# Patient Record
Sex: Female | Born: 1990 | Race: White | Hispanic: No | Marital: Single | Smoking: Never smoker
Health system: Southern US, Community
[De-identification: ages and names within clinical notes are randomized; demographics above are authoritative.]

## PROBLEM LIST (undated history)

## (undated) DIAGNOSIS — K219 Gastro-esophageal reflux disease without esophagitis: Secondary | ICD-10-CM

## (undated) DIAGNOSIS — D509 Iron deficiency anemia, unspecified: Secondary | ICD-10-CM

## (undated) DIAGNOSIS — E538 Deficiency of other specified B group vitamins: Secondary | ICD-10-CM

## (undated) DIAGNOSIS — F419 Anxiety disorder, unspecified: Secondary | ICD-10-CM

## (undated) DIAGNOSIS — R519 Headache, unspecified: Secondary | ICD-10-CM

## (undated) DIAGNOSIS — K31A0 Gastric intestinal metaplasia, unspecified: Secondary | ICD-10-CM

## (undated) DIAGNOSIS — E669 Obesity, unspecified: Secondary | ICD-10-CM

## (undated) DIAGNOSIS — F32A Depression, unspecified: Secondary | ICD-10-CM

## (undated) DIAGNOSIS — T8859XA Other complications of anesthesia, initial encounter: Secondary | ICD-10-CM

## (undated) DIAGNOSIS — Z8489 Family history of other specified conditions: Secondary | ICD-10-CM

## (undated) DIAGNOSIS — G43909 Migraine, unspecified, not intractable, without status migrainosus: Secondary | ICD-10-CM

## (undated) DIAGNOSIS — G8929 Other chronic pain: Secondary | ICD-10-CM

## (undated) DIAGNOSIS — T753XXA Motion sickness, initial encounter: Secondary | ICD-10-CM

## (undated) HISTORY — PX: WISDOM TOOTH EXTRACTION: SHX21

## (undated) HISTORY — PX: UPPER GI ENDOSCOPY: SHX6162

## (undated) HISTORY — DX: Obesity, unspecified: E66.9

## (undated) HISTORY — DX: Headache, unspecified: R51.9

## (undated) HISTORY — DX: Other chronic pain: G89.29

## (undated) HISTORY — DX: Depression, unspecified: F32.A

## (undated) HISTORY — DX: Iron deficiency anemia, unspecified: D50.9

## (undated) HISTORY — DX: Deficiency of other specified B group vitamins: E53.8

## (undated) HISTORY — DX: Gastric intestinal metaplasia, unspecified: K31.A0

---
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
## 2012-02-03 ENCOUNTER — Other Ambulatory Visit: Payer: Self-pay | Admitting: Internal Medicine

## 2012-03-02 ENCOUNTER — Ambulatory Visit: Payer: BC Managed Care – PPO | Admitting: Internal Medicine

## 2012-04-28 ENCOUNTER — Ambulatory Visit: Payer: BC Managed Care – PPO | Admitting: Internal Medicine

## 2012-10-25 ENCOUNTER — Telehealth: Payer: Self-pay | Admitting: Internal Medicine

## 2012-10-25 NOTE — Telephone Encounter (Signed)
Patient needing Hep B shot for school before Thursday 8.14.14 . Please advise.

## 2012-10-26 ENCOUNTER — Encounter: Payer: Self-pay | Admitting: *Deleted

## 2012-10-26 NOTE — Telephone Encounter (Signed)
Spoke with patient mother she wanted to know if the patient has ever had a Tdap. Informed her we do not have any documentation in our records of her receiving it here.

## 2012-10-26 NOTE — Telephone Encounter (Signed)
Left message to call back  

## 2013-04-07 ENCOUNTER — Telehealth: Payer: Self-pay | Admitting: Internal Medicine

## 2013-04-07 NOTE — Telephone Encounter (Signed)
She would like for her to have something stronger than OTC medications to take for her lactose intolerance.

## 2013-04-07 NOTE — Telephone Encounter (Signed)
Mother in office to ask if there is a prescription medication to help with Demetris's lactose intolerance that is stronger than OTC meds.

## 2013-04-07 NOTE — Telephone Encounter (Signed)
There is not a medication for lactose intolerance that is stronger than lactaid. The treatment is avoidance of lactose, ie dairy products. If symptoms persistent, though, she should be seen and evaluated for other causes of symptoms.

## 2013-04-17 NOTE — Telephone Encounter (Signed)
Left message to call back  

## 2013-04-18 ENCOUNTER — Encounter: Payer: Self-pay | Admitting: *Deleted

## 2013-04-18 NOTE — Telephone Encounter (Signed)
No return call, letter mailed to patient's mother home address on file.

## 2014-10-18 ENCOUNTER — Encounter (INDEPENDENT_AMBULATORY_CARE_PROVIDER_SITE_OTHER): Payer: Self-pay

## 2014-10-18 ENCOUNTER — Ambulatory Visit (INDEPENDENT_AMBULATORY_CARE_PROVIDER_SITE_OTHER): Payer: BC Managed Care – PPO | Admitting: Internal Medicine

## 2014-10-18 ENCOUNTER — Encounter: Payer: Self-pay | Admitting: Internal Medicine

## 2014-10-18 VITALS — BP 109/74 | HR 77 | Temp 98.1°F | Ht 67.0 in | Wt 193.2 lb

## 2014-10-18 DIAGNOSIS — Z0000 Encounter for general adult medical examination without abnormal findings: Secondary | ICD-10-CM | POA: Diagnosis not present

## 2014-10-18 DIAGNOSIS — R358 Other polyuria: Secondary | ICD-10-CM

## 2014-10-18 DIAGNOSIS — D509 Iron deficiency anemia, unspecified: Secondary | ICD-10-CM | POA: Diagnosis not present

## 2014-10-18 DIAGNOSIS — E669 Obesity, unspecified: Secondary | ICD-10-CM | POA: Diagnosis not present

## 2014-10-18 DIAGNOSIS — R3589 Other polyuria: Secondary | ICD-10-CM | POA: Insufficient documentation

## 2014-10-18 DIAGNOSIS — E663 Overweight: Secondary | ICD-10-CM | POA: Insufficient documentation

## 2014-10-18 LAB — POCT URINALYSIS DIPSTICK
BILIRUBIN UA: NEGATIVE
GLUCOSE UA: NEGATIVE
Ketones, UA: NEGATIVE
LEUKOCYTES UA: NEGATIVE
NITRITE UA: NEGATIVE
PH UA: 5.5
PROTEIN UA: NEGATIVE
SPEC GRAV UA: 1.02
UROBILINOGEN UA: 0.2

## 2014-10-18 LAB — CBC WITH DIFFERENTIAL/PLATELET
Basophils Absolute: 0.1 10*3/uL (ref 0.0–0.1)
Basophils Relative: 1.8 % (ref 0.0–3.0)
EOS ABS: 0.1 10*3/uL (ref 0.0–0.7)
Eosinophils Relative: 2.2 % (ref 0.0–5.0)
HEMATOCRIT: 37.3 % (ref 36.0–46.0)
HEMOGLOBIN: 12.6 g/dL (ref 12.0–15.0)
LYMPHS PCT: 24.1 % (ref 12.0–46.0)
Lymphs Abs: 1.3 10*3/uL (ref 0.7–4.0)
MCHC: 33.7 g/dL (ref 30.0–36.0)
MCV: 94.3 fl (ref 78.0–100.0)
MONOS PCT: 7.4 % (ref 3.0–12.0)
Monocytes Absolute: 0.4 10*3/uL (ref 0.1–1.0)
NEUTROS PCT: 64.5 % (ref 43.0–77.0)
Neutro Abs: 3.5 10*3/uL (ref 1.4–7.7)
Platelets: 214 10*3/uL (ref 150.0–400.0)
RBC: 3.95 Mil/uL (ref 3.87–5.11)
RDW: 12.5 % (ref 11.5–15.5)
WBC: 5.4 10*3/uL (ref 4.0–10.5)

## 2014-10-18 LAB — HEMOGLOBIN A1C: Hgb A1c MFr Bld: 5 % (ref 4.6–6.5)

## 2014-10-18 LAB — LIPID PANEL
CHOLESTEROL: 153 mg/dL (ref 0–200)
HDL: 48 mg/dL (ref >39.00)
LDL CALC: 92 mg/dL (ref 0–99)
NonHDL: 105.24
TRIGLYCERIDES: 67 mg/dL (ref 0.0–149.0)
Total CHOL/HDL Ratio: 3
VLDL: 13.4 mg/dL (ref 0.0–40.0)

## 2014-10-18 LAB — COMPREHENSIVE METABOLIC PANEL
ALT: 14 U/L (ref 0–35)
AST: 20 U/L (ref 0–37)
Albumin: 4.3 g/dL (ref 3.5–5.2)
Alkaline Phosphatase: 62 U/L (ref 39–117)
BILIRUBIN TOTAL: 0.4 mg/dL (ref 0.2–1.2)
BUN: 12 mg/dL (ref 6–23)
CO2: 24 meq/L (ref 19–32)
CREATININE: 0.68 mg/dL (ref 0.40–1.20)
Calcium: 9.3 mg/dL (ref 8.4–10.5)
Chloride: 104 mEq/L (ref 96–112)
GFR: 115.21 mL/min (ref >60.00)
Glucose, Bld: 76 mg/dL (ref 70–99)
Potassium: 4.8 mEq/L (ref 3.5–5.1)
SODIUM: 137 meq/L (ref 135–145)
TOTAL PROTEIN: 7.1 g/dL (ref 6.0–8.3)

## 2014-10-18 LAB — FERRITIN: FERRITIN: 55.1 ng/mL (ref 10.0–291.0)

## 2014-10-18 LAB — TSH: TSH: 3.23 u[IU]/mL (ref 0.35–4.50)

## 2014-10-18 MED ORDER — PHENTERMINE HCL 37.5 MG PO CAPS: 37.5000 mg | ORAL_CAPSULE | ORAL | Status: DC

## 2014-10-18 NOTE — Assessment & Plan Note (Signed)
Wt Readings from Last 3 Encounters:  10/18/14 193 lb 4 oz (87.658 kg)  08/26/11 144 lb 8 oz (65.545 kg) (78 %*, Z = 0.77)  02/11/11 141 lb (63.957 kg) (76 %*, Z = 0.71)   * Growth percentiles are based on CDC 2-20 Years data.   Body mass index is 30.26 kg/(m^2). Encouraged healthy diet and exercise. Will start Phentermine to help with appetite suppression. Discussed potential side effects of this medication. Follow up in 4 weeks.

## 2014-10-18 NOTE — Assessment & Plan Note (Signed)
Will check urinalysis and blood sugars today.

## 2014-10-18 NOTE — Assessment & Plan Note (Signed)
Will recheck CBC and ferritin

## 2014-10-18 NOTE — Progress Notes (Signed)
Pre visit review using our clinic review tool, if applicable. No additional management support is needed unless otherwise documented below in the visit note. 

## 2014-10-18 NOTE — Progress Notes (Signed)
Subjective:    Patient ID: Melissa Walton, female    DOB: 1990/05/14, 24 y.o.   MRN: [id number]  HPI  23YO female presents for follow up and to re-establish care. Last seen 3 years ago.  Had some mild bilateral back pain last week which has resolved. No dysuria. Some increased frequency of urination. No blood in urine. No abdominal pain. No polyphagia. Strong family history of diabetes.  Worried about weight. Planning to start at a gym. Not following any diet.  Wt Readings from Last 3 Encounters:  10/18/14 193 lb 4 oz (87.658 kg)  08/26/11 144 lb 8 oz (65.545 kg) (78 %*, Z = 0.77)  02/11/11 141 lb (63.957 kg) (76 %*, Z = 0.71)   * Growth percentiles are based on CDC 2-20 Years data.     Past medical, surgical, family and social history per today's encounter.   Review of Systems  Constitutional: Negative for fever, chills, appetite change, fatigue and unexpected weight change.  Eyes: Negative for visual disturbance.  Respiratory: Negative for shortness of breath.   Cardiovascular: Negative for chest pain and leg swelling.  Gastrointestinal: Negative for abdominal pain, diarrhea and constipation.  Genitourinary: Positive for frequency. Negative for dysuria, urgency, hematuria, flank pain, vaginal bleeding, vaginal discharge, vaginal pain and pelvic pain.  Musculoskeletal: Positive for back pain. Negative for myalgias and arthralgias.  Skin: Negative for color change and rash.  Hematological: Negative for adenopathy. Does not bruise/bleed easily.  Psychiatric/Behavioral: Negative for sleep disturbance and dysphoric mood. The patient is not nervous/anxious.        Objective:    BP 109/74 mmHg  Pulse 77  Temp(Src) 98.1 F (36.7 C) (Oral)  Ht  (1.702 m)  Wt 193 lb 4 oz (87.658 kg)  BMI 30.26 kg/m2  SpO2 99%  LMP 09/17/2014 (Approximate) Physical Exam  Constitutional: She is oriented to person, place, and time. She appears well-developed and well-nourished. No  distress.  HENT:  Head: Normocephalic and atraumatic.  Right Ear: External ear normal.  Left Ear: External ear normal.  Nose: Nose normal.  Mouth/Throat: Oropharynx is clear and moist. No oropharyngeal exudate.  Eyes: Conjunctivae and EOM are normal. Pupils are equal, round, and reactive to light. Right eye exhibits no discharge.  Neck: Normal range of motion. Neck supple. No thyromegaly present.  Cardiovascular: Normal rate, regular rhythm, normal heart sounds and intact distal pulses.  Exam reveals no gallop and no friction rub.   No murmur heard. Pulmonary/Chest: Effort normal. No respiratory distress. She has no wheezes. She has no rales.  Abdominal: Soft. Bowel sounds are normal. She exhibits no distension and no mass. There is no tenderness. There is no rebound and no guarding.  Musculoskeletal: Normal range of motion. She exhibits no edema or tenderness.  Lymphadenopathy:    She has no cervical adenopathy.  Neurological: She is alert and oriented to person, place, and time. No cranial nerve deficit. Coordination normal.  Skin: Skin is warm and dry. No rash noted. She is not diaphoretic. No erythema. No pallor.  Psychiatric: She has a normal mood and affect. Her behavior is normal. Judgment and thought content normal.          Assessment & Plan:   Problem List Items Addressed This Visit      Unprioritized   Iron deficiency anemia    Will recheck CBC and ferritin.      Relevant Medications   ferrous sulfate 325 (65 FE) MG tablet   Other Relevant Orders  CBC with Differential/Platelet   Ferritin   Obesity (BMI 30-39.9) - Primary    Wt Readings from Last 3 Encounters:  10/18/14 193 lb 4 oz (87.658 kg)  08/26/11 144 lb 8 oz (65.545 kg) (78 %*, Z = 0.77)  02/11/11 141 lb (63.957 kg) (76 %*, Z = 0.71)   * Growth percentiles are based on CDC 2-20 Years data.   Body mass index is 30.26 kg/(m^2). Encouraged healthy diet and exercise. Will start Phentermine to help with  appetite suppression. Discussed potential side effects of this medication. Follow up in 4 weeks.      Relevant Medications   phentermine 37.5 MG capsule   Polyuria    Will check urinalysis and blood sugars today.      Relevant Orders   POCT Urinalysis Dipstick   Routine general medical examination at a health care facility   Relevant Orders   TSH   Comprehensive metabolic panel   Hemoglobin A1c   Lipid panel       Return in about 4 weeks (around 11/15/2014) for Recheck.

## 2014-10-18 NOTE — Patient Instructions (Addendum)
Keep a food diary. Consider using MyFitness Pal  Set goal carbohydrates to 35gm with meal and 15gm with snack.  Increase exercise to goal of 4-5 hr per week.  Start Phentermine 37.5mg  daily to help with appetite.

## 2014-10-19 ENCOUNTER — Encounter: Payer: Self-pay | Admitting: *Deleted

## 2014-11-06 ENCOUNTER — Encounter: Payer: Self-pay | Admitting: Family Medicine

## 2014-11-06 ENCOUNTER — Ambulatory Visit (INDEPENDENT_AMBULATORY_CARE_PROVIDER_SITE_OTHER): Payer: BC Managed Care – PPO | Admitting: Family Medicine

## 2014-11-06 VITALS — BP 128/80 | HR 102 | Temp 98.6°F | Ht 67.0 in | Wt 181.4 lb

## 2014-11-06 DIAGNOSIS — J3489 Other specified disorders of nose and nasal sinuses: Secondary | ICD-10-CM | POA: Diagnosis not present

## 2014-11-06 MED ORDER — AZITHROMYCIN 250 MG PO TABS: ORAL_TABLET | ORAL | Status: DC

## 2014-11-06 NOTE — Patient Instructions (Signed)
It was nice to see you today.  Use the antibiotics if needed.  Use Nettipot and Nasocort daily.  Follow up as needed.  Take care  Dr. Adriana Simas

## 2014-11-06 NOTE — Progress Notes (Signed)
Pre visit review using our clinic review tool, if applicable. No additional management support is needed unless otherwise documented below in the visit note. 

## 2014-11-06 NOTE — Assessment & Plan Note (Signed)
Unclear etiology. Patient reports that these are the symptoms that she had from her prior sinus infection. Exam unremarkable. Recommended Nasocort, Nettipot and PRN Ibuprofen. Rx for Azithromycin given if she fails to improve.

## 2014-11-06 NOTE — Progress Notes (Signed)
   Subjective:  Patient ID: Melissa Walton, female    DOB: [birth year]  Age: 24 y.o. MRN: [id number]  CC:  Sinus pain/pressure  HPI 24 year old female presents for an acute visit with complaints of sinus pain and pressure.  Patient reports that she has had frontal and maxillary sinus pain and pressure for approximately 2 weeks.  No known inciting factor.  She denies any associated fever or chills. No purulent nasal discharge. She's been taking some over-the-counter sinus medication as well as Nasacort with no improvement. No known sick contacts. No known exacerbating factors. She states that this is the exact presentation of her prior sinus infection.  Social Hx - Non smoker.  Review of Systems  Constitutional: Negative for fever and chills.  HENT: Positive for sinus pressure. Negative for ear pain and rhinorrhea.     Objective:  BP 128/80 mmHg  Pulse 102  Temp(Src) 98.6 F (37 C) (Oral)  Ht  (1.702 m)  Wt 181 lb 6 oz (82.271 kg)  BMI 28.40 kg/m2  SpO2 98%  LMP 10/23/2014  BP/Weight 11/06/2014 10/18/2014 08/26/2011  Systolic BP 128 109 102  Diastolic BP 80 74 72  Wt. (Lbs) 181.38 193.25 144.5  BMI 28.4 30.26 22.63     Physical Exam  Constitutional: She appears well-developed and well-nourished.  HENT:  Head: Normocephalic and atraumatic.  Right Ear: External ear normal.  Left Ear: External ear normal.  Mouth/Throat: Oropharynx is clear and moist. No oropharyngeal exudate.  Mild maxillary & frontal sinus tenderness to palpation.  Neck: Neck supple.  Cardiovascular: Normal rate and regular rhythm.   Pulmonary/Chest: Effort normal and breath sounds normal. No respiratory distress. She has no wheezes. She has no rales.  Abdominal: Soft. She exhibits no distension. There is no tenderness. There is no rebound and no guarding.  Lymphadenopathy:    She has no cervical adenopathy.  Vitals reviewed.   Lab Results  Component Value Date   WBC 5.4 10/18/2014   HGB 12.6  10/18/2014   HCT 37.3 10/18/2014   PLT 214.0 10/18/2014   GLUCOSE 76 10/18/2014   CHOL 153 10/18/2014   TRIG 67.0 10/18/2014   HDL 48.00 10/18/2014   LDLCALC 92 10/18/2014   ALT 14 10/18/2014   AST 20 10/18/2014   NA 137 10/18/2014   K 4.8 10/18/2014   CL 104 10/18/2014   CREATININE 0.68 10/18/2014   BUN 12 10/18/2014   CO2 24 10/18/2014   TSH 3.23 10/18/2014   INR 1.0 08/26/2011   HGBA1C 5.0 10/18/2014    Assessment & Plan:   Problem List Items Addressed This Visit    Sinus pain - Primary    Unclear etiology. Patient reports that these are the symptoms that she had from her prior sinus infection. Exam unremarkable. Recommended Nasocort, Nettipot and PRN Ibuprofen. Rx for Azithromycin given if she fails to improve.         Meds ordered this encounter  Medications  . azithromycin (ZITHROMAX) 250 MG tablet    Sig: 2 tablets on Day 1; 1 tablet daily on Days 2-5.    Dispense:  6 tablet    Refill:  0    Do not fill until 11/08/14.    Follow-up: PRN   Everlene Other, DO

## 2014-11-23 ENCOUNTER — Encounter: Payer: Self-pay | Admitting: Internal Medicine

## 2014-11-23 ENCOUNTER — Ambulatory Visit (INDEPENDENT_AMBULATORY_CARE_PROVIDER_SITE_OTHER): Payer: BC Managed Care – PPO | Admitting: Internal Medicine

## 2014-11-23 VITALS — BP 106/69 | HR 86 | Temp 98.2°F | Ht 67.0 in | Wt 181.0 lb

## 2014-11-23 DIAGNOSIS — E669 Obesity, unspecified: Secondary | ICD-10-CM

## 2014-11-23 DIAGNOSIS — D509 Iron deficiency anemia, unspecified: Secondary | ICD-10-CM | POA: Diagnosis not present

## 2014-11-23 MED ORDER — PHENTERMINE HCL 37.5 MG PO CAPS: 37.5000 mg | ORAL_CAPSULE | ORAL | Status: DC

## 2014-11-23 NOTE — Assessment & Plan Note (Signed)
Recent labs showed normal blood counts and iron stores. Will stop iron supplementation.

## 2014-11-23 NOTE — Assessment & Plan Note (Signed)
Wt Readings from Last 3 Encounters:  11/23/14 181 lb (82.101 kg)  11/06/14 181 lb 6 oz (82.271 kg)  10/18/14 193 lb 4 oz (87.658 kg)   Body mass index is 28.34 kg/(m^2). Encouraged healthy diet and exercise. Will continue phentermine for 2 more months to help with appetite. Follow up recheck in 2 months.

## 2014-11-23 NOTE — Progress Notes (Signed)
Pre visit review using our clinic review tool, if applicable. No additional management support is needed unless otherwise documented below in the visit note. 

## 2014-11-23 NOTE — Patient Instructions (Signed)
Continue Phentermine.  Follow up 2 months.

## 2014-11-23 NOTE — Progress Notes (Signed)
Subjective:    Patient ID: Melissa Walton, female    DOB: 10/28/1990, 24 y.o.   MRN: [id number]  HPI  23YO female presents for follow up.  Some trouble sleeping with use of Phentermine. Otherwise tolerating well. Limiting calories and exercising by walking occasionally.  Questions whether she needs to continue iron supplements. No longer having heavy menstrual cycles.   No new concerns today.  Wt Readings from Last 3 Encounters:  11/23/14 181 lb (82.101 kg)  11/06/14 181 lb 6 oz (82.271 kg)  10/18/14 193 lb 4 oz (87.658 kg)   BP Readings from Last 3 Encounters:  11/23/14 106/69  11/06/14 128/80  10/18/14 109/74     Past Medical History  Diagnosis Date  . Concussion     high school   Family History  Problem Relation Age of Onset  . Diabetes Mother   . Hyperlipidemia Mother   . Hypertension Father   . Gout Father   . Heart disease Maternal Grandfather   . Ovarian cancer Paternal Grandmother    No past surgical history on file. Social History   Social History  . Marital Status: Single    Spouse Name: N/A  . Number of Children: N/A  . Years of Education: N/A   Social History Main Topics  . Smoking status: Never Smoker   . Smokeless tobacco: Never Used  . Alcohol Use: No  . Drug Use: No  . Sexual Activity: Not Asked   Other Topics Concern  . None   Social History Narrative   Lives with parents in [place]. Starting at [employer]- [employer].    Review of Systems  Constitutional: Negative for fever, chills, appetite change, fatigue and unexpected weight change.  Eyes: Negative for visual disturbance.  Respiratory: Negative for shortness of breath.   Cardiovascular: Negative for chest pain and leg swelling.  Gastrointestinal: Negative for nausea, vomiting, abdominal pain, diarrhea and constipation.  Genitourinary: Negative for menstrual problem.  Skin: Negative for color change and rash.  Hematological: Negative for adenopathy. Does not bruise/bleed  easily.  Psychiatric/Behavioral: Positive for sleep disturbance. Negative for dysphoric mood. The patient is not nervous/anxious.        Objective:    BP 106/69 mmHg  Pulse 86  Temp(Src) 98.2 F (36.8 C) (Oral)  Ht 5\' 7"  (1.702 m)  Wt 181 lb (82.101 kg)  BMI 28.34 kg/m2  SpO2 100%  LMP 10/23/2014 Physical Exam  Constitutional: She is oriented to person, place, and time. She appears well-developed and well-nourished. No distress.  HENT:  Head: Normocephalic and atraumatic.  Right Ear: External ear normal.  Left Ear: External ear normal.  Nose: Nose normal.  Mouth/Throat: Oropharynx is clear and moist.  Eyes: Conjunctivae are normal. Pupils are equal, round, and reactive to light. Right eye exhibits no discharge. Left eye exhibits no discharge. No scleral icterus.  Neck: Normal range of motion. Neck supple. No tracheal deviation present. No thyromegaly present.  Cardiovascular: Normal rate, regular rhythm, normal heart sounds and intact distal pulses.  Exam reveals no gallop and no friction rub.   No murmur heard. Pulmonary/Chest: Effort normal and breath sounds normal. No respiratory distress. She has no wheezes. She has no rales. She exhibits no tenderness.  Musculoskeletal: Normal range of motion. She exhibits no edema or tenderness.  Lymphadenopathy:    She has no cervical adenopathy.  Neurological: She is alert and oriented to person, place, and time. No cranial nerve deficit. She exhibits normal muscle tone. Coordination normal.  Skin:  Skin is warm and dry. No rash noted. She is not diaphoretic. No erythema. No pallor.  Psychiatric: She has a normal mood and affect. Her behavior is normal. Judgment and thought content normal.          Assessment & Plan:   Problem List Items Addressed This Visit      Unprioritized   Iron deficiency anemia    Recent labs showed normal blood counts and iron stores. Will stop iron supplementation.      Obesity (BMI 30-39.9) - Primary     Wt Readings from Last 3 Encounters:  11/23/14 181 lb (82.101 kg)  11/06/14 181 lb 6 oz (82.271 kg)  10/18/14 193 lb 4 oz (87.658 kg)   Body mass index is 28.34 kg/(m^2). Encouraged healthy diet and exercise. Will continue phentermine for 2 more months to help with appetite. Follow up recheck in 2 months.      Relevant Medications   phentermine 37.5 MG capsule       Return in about 2 months (around 01/23/2015) for Recheck.

## 2015-01-23 ENCOUNTER — Encounter: Payer: Self-pay | Admitting: Internal Medicine

## 2015-01-23 ENCOUNTER — Ambulatory Visit (INDEPENDENT_AMBULATORY_CARE_PROVIDER_SITE_OTHER): Payer: BC Managed Care – PPO | Admitting: Internal Medicine

## 2015-01-23 VITALS — BP 123/72 | HR 93 | Temp 98.4°F | Ht 67.0 in | Wt 181.1 lb

## 2015-01-23 DIAGNOSIS — Z23 Encounter for immunization: Secondary | ICD-10-CM

## 2015-01-23 DIAGNOSIS — R238 Other skin changes: Secondary | ICD-10-CM | POA: Diagnosis not present

## 2015-01-23 DIAGNOSIS — E663 Overweight: Secondary | ICD-10-CM

## 2015-01-23 DIAGNOSIS — R233 Spontaneous ecchymoses: Secondary | ICD-10-CM

## 2015-01-23 DIAGNOSIS — K219 Gastro-esophageal reflux disease without esophagitis: Secondary | ICD-10-CM

## 2015-01-23 LAB — CBC
HCT: 37.9 % (ref 36.0–46.0)
Hemoglobin: 12.6 g/dL (ref 12.0–15.0)
MCHC: 33.2 g/dL (ref 30.0–36.0)
MCV: 94.7 fl (ref 78.0–100.0)
Platelets: 244 K/uL (ref 150.0–400.0)
RBC: 4 Mil/uL (ref 3.87–5.11)
RDW: 12.6 % (ref 11.5–15.5)
WBC: 4.7 K/uL (ref 4.0–10.5)

## 2015-01-23 LAB — FERRITIN: Ferritin: 42.2 ng/mL (ref 10.0–291.0)

## 2015-01-23 MED ORDER — PANTOPRAZOLE SODIUM 40 MG PO TBEC: 40.0000 mg | DELAYED_RELEASE_TABLET | Freq: Every day | ORAL | Status: DC

## 2015-01-23 NOTE — Assessment & Plan Note (Signed)
Recurrent symptoms of GERD. Will add back prn Pantoprazole. Follow up prn if symptoms not improved.
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
## 2015-01-23 NOTE — Assessment & Plan Note (Signed)
Wt Readings from Last 3 Encounters:  01/23/15 181 lb 2 oz (82.158 kg)  11/23/14 181 lb (82.101 kg)  11/06/14 181 lb 6 oz (82.271 kg)   No recent weight loss on Phentermine. Discussed FDA guidelines to use this medication 3 months. Will stop phentermine. Continue healthy diet and exercise.

## 2015-01-23 NOTE — Progress Notes (Signed)
Pre visit review using our clinic review tool, if applicable. No additional management support is needed unless otherwise documented below in the visit note. 

## 2015-01-23 NOTE — Patient Instructions (Addendum)
Consider reading the book, Obesity Code.  Labs today.   Follow up in 6 months and sooner as needed.

## 2015-01-23 NOTE — Assessment & Plan Note (Signed)
She describes easy bruising. Exam is normal. Will check CBC and ferritin as well as screen for Von Willebrands. Follow up prn after labs.

## 2015-01-26 LAB — VON WILLEBRAND PANEL
COAGULATION FACTOR VIII: 49 % — AB (ref 50–180)
RISTOCETIN CO-FACTOR, PLASMA: 43 % (ref 42–200)
VON WILLEBRAND ANTIGEN, PLASMA: 53 % (ref 50–217)

## 2015-01-28 ENCOUNTER — Encounter: Payer: Self-pay | Admitting: Internal Medicine

## 2015-01-28 DIAGNOSIS — D689 Coagulation defect, unspecified: Secondary | ICD-10-CM

## 2015-02-04 ENCOUNTER — Encounter: Payer: Self-pay | Admitting: Internal Medicine

## 2015-02-12 ENCOUNTER — Encounter: Payer: Self-pay | Admitting: Internal Medicine

## 2015-02-12 ENCOUNTER — Ambulatory Visit (INDEPENDENT_AMBULATORY_CARE_PROVIDER_SITE_OTHER): Payer: BC Managed Care – PPO | Admitting: Internal Medicine

## 2015-02-12 ENCOUNTER — Ambulatory Visit: Payer: Self-pay | Admitting: Internal Medicine

## 2015-02-12 VITALS — BP 126/84 | HR 72 | Temp 97.9°F | Ht 67.0 in | Wt 179.5 lb

## 2015-02-12 DIAGNOSIS — J0101 Acute recurrent maxillary sinusitis: Secondary | ICD-10-CM | POA: Diagnosis not present

## 2015-02-12 MED ORDER — CEFUROXIME AXETIL 250 MG PO TABS: 250.0000 mg | ORAL_TABLET | Freq: Two times a day (BID) | ORAL | Status: DC

## 2015-02-12 NOTE — Progress Notes (Signed)
Pre visit review using our clinic review tool, if applicable. No additional management support is needed unless otherwise documented below in the visit note. 

## 2015-02-12 NOTE — Progress Notes (Signed)
Subjective:    Patient ID: Melissa Walton, female    DOB: August 17, 1990, 24 y.o.   MRN: [id number]  HPI  24YO female presents for acute visit.  Sinus pressure - Seen by Dr. Elenore RotaJuengel in the past, told she needs sinus surgery. Last sinus infection in 12/2014. Having pain and pressure over forehead with severe headaches. Taking OTC sinus decongestion medication with no improvement. No fever or cough.   Wt Readings from Last 3 Encounters:  02/12/15 179 lb 8 oz (81.421 kg)  01/23/15 181 lb 2 oz (82.158 kg)  11/23/14 181 lb (82.101 kg)   BP Readings from Last 3 Encounters:  02/12/15 126/84  01/23/15 123/72  11/23/14 106/69    Past Medical History  Diagnosis Date  . Concussion     high school   Family History  Problem Relation Age of Onset  . Diabetes Mother   . Hyperlipidemia Mother   . Hypertension Father   . Gout Father   . Heart disease Maternal Grandfather   . Ovarian cancer Paternal Grandmother    No past surgical history on file. Social History   Social History  . Marital Status: Single    Spouse Name: N/A  . Number of Children: N/A  . Years of Education: N/A   Social History Main Topics  . Smoking status: Never Smoker   . Smokeless tobacco: Never Used  . Alcohol Use: No  . Drug Use: No  . Sexual Activity: Not Asked   Other Topics Concern  . None   Social History Narrative   Lives with parents in [place]. Dog. Starting at [employer].    Review of Systems  Constitutional: Negative for fever, chills and unexpected weight change.  HENT: Positive for congestion and sinus pressure. Negative for ear discharge, ear pain, facial swelling, hearing loss, mouth sores, nosebleeds, postnasal drip, rhinorrhea, sneezing, sore throat, tinnitus, trouble swallowing and voice change.   Eyes: Negative for pain, discharge, redness and visual disturbance.  Respiratory: Negative for cough, chest tightness, shortness of breath, wheezing and stridor.   Cardiovascular:  Negative for chest pain, palpitations and leg swelling.  Musculoskeletal: Negative for myalgias, arthralgias, neck pain and neck stiffness.  Skin: Negative for color change and rash.  Neurological: Positive for headaches. Negative for dizziness, weakness and light-headedness.  Hematological: Negative for adenopathy.       Objective:    BP 126/84 mmHg  Pulse 72  Temp(Src) 97.9 F (36.6 C) (Oral)  Ht 5\' 7"  (1.702 m)  Wt 179 lb 8 oz (81.421 kg)  BMI 28.11 kg/m2  SpO2 98%  LMP 12/28/2014 Physical Exam  Constitutional: She is oriented to person, place, and time. She appears well-developed and well-nourished. No distress.  HENT:  Head: Normocephalic and atraumatic.  Right Ear: External ear normal. A middle ear effusion is present.  Left Ear: External ear normal. A middle ear effusion is present.  Nose: Mucosal edema present. Right sinus exhibits maxillary sinus tenderness and frontal sinus tenderness. Left sinus exhibits maxillary sinus tenderness and frontal sinus tenderness.  Mouth/Throat: Oropharynx is clear and moist. No oropharyngeal exudate.  Eyes: Conjunctivae are normal. Pupils are equal, round, and reactive to light. Right eye exhibits no discharge. Left eye exhibits no discharge. No scleral icterus.  Neck: Normal range of motion. Neck supple. No tracheal deviation present. No thyromegaly present.  Cardiovascular: Normal rate, regular rhythm, normal heart sounds and intact distal pulses.  Exam reveals no gallop and no friction rub.   No murmur heard. Pulmonary/Chest:  Effort normal and breath sounds normal. No respiratory distress. She has no wheezes. She has no rales. She exhibits no tenderness.  Musculoskeletal: Normal range of motion. She exhibits no edema or tenderness.  Lymphadenopathy:    She has no cervical adenopathy.  Neurological: She is alert and oriented to person, place, and time. No cranial nerve deficit. She exhibits normal muscle tone. Coordination normal.  Skin:  Skin is warm and dry. No rash noted. She is not diaphoretic. No erythema. No pallor.  Psychiatric: She has a normal mood and affect. Her behavior is normal. Judgment and thought content normal.          Assessment & Plan:   Problem List Items Addressed This Visit      Unprioritized   Acute recurrent maxillary sinusitis - Primary    Symptoms and exam c/w maxillary sinusitis. Will start Cefuroxime. She will call or email to confirm previous antibiotic allergies. Continue OTC decongestants and prn ibuprofen. Follow up prn if symptoms are not improving. We discussed adding Prednisone if no improvement.      Relevant Medications   cefUROXime (CEFTIN) 250 MG tablet       Return if symptoms worsen or fail to improve.

## 2015-02-12 NOTE — Patient Instructions (Signed)

## 2015-02-12 NOTE — Assessment & Plan Note (Signed)
Symptoms and exam c/w maxillary sinusitis. Will start Cefuroxime. She will call or email to confirm previous antibiotic allergies. Continue OTC decongestants and prn ibuprofen. Follow up prn if symptoms are not improving. We discussed adding Prednisone if no improvement.

## 2015-02-19 ENCOUNTER — Telehealth: Payer: Self-pay | Admitting: *Deleted

## 2015-02-19 ENCOUNTER — Encounter: Payer: Self-pay | Admitting: Internal Medicine

## 2015-02-20 MED ORDER — AZITHROMYCIN 250 MG PO TABS: ORAL_TABLET | ORAL | Status: DC

## 2015-03-05 ENCOUNTER — Encounter: Payer: Self-pay | Admitting: Internal Medicine

## 2015-03-14 ENCOUNTER — Telehealth: Payer: Self-pay | Admitting: Physician Assistant

## 2015-03-14 DIAGNOSIS — B9689 Other specified bacterial agents as the cause of diseases classified elsewhere: Secondary | ICD-10-CM

## 2015-03-14 DIAGNOSIS — J019 Acute sinusitis, unspecified: Secondary | ICD-10-CM

## 2015-03-14 MED ORDER — DOXYCYCLINE HYCLATE 100 MG PO CAPS: 100.0000 mg | ORAL_CAPSULE | Freq: Two times a day (BID) | ORAL | Status: DC

## 2015-03-14 NOTE — Progress Notes (Signed)

## 2015-07-23 ENCOUNTER — Ambulatory Visit: Payer: BC Managed Care – PPO | Admitting: Internal Medicine

## 2016-11-03 ENCOUNTER — Ambulatory Visit (INDEPENDENT_AMBULATORY_CARE_PROVIDER_SITE_OTHER): Payer: BC Managed Care – PPO | Admitting: Family

## 2016-11-03 ENCOUNTER — Encounter: Payer: Self-pay | Admitting: Family

## 2016-11-03 VITALS — BP 110/60 | HR 62 | Temp 98.6°F | Resp 12 | Wt 212.4 lb

## 2016-11-03 DIAGNOSIS — M62838 Other muscle spasm: Secondary | ICD-10-CM | POA: Diagnosis not present

## 2016-11-03 MED ORDER — CYCLOBENZAPRINE HCL 5 MG PO TABS: 5.0000 mg | ORAL_TABLET | Freq: Every evening | ORAL | 0 refills | Status: DC | PRN
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
## 2016-12-23 ENCOUNTER — Telehealth: Payer: Self-pay | Admitting: Family

## 2016-12-23 NOTE — Telephone Encounter (Signed)
LMTCB

## 2016-12-23 NOTE — Telephone Encounter (Signed)
Pt called c/o burning when urinating, frequency and urgency. Please advise thank you!  Call pt @ [phone]

## 2017-01-05 ENCOUNTER — Ambulatory Visit (INDEPENDENT_AMBULATORY_CARE_PROVIDER_SITE_OTHER): Payer: BC Managed Care – PPO | Admitting: Family

## 2017-01-05 ENCOUNTER — Encounter: Payer: Self-pay | Admitting: Family

## 2017-01-05 VITALS — BP 124/76 | HR 72 | Temp 98.2°F | Ht 67.0 in | Wt 209.6 lb

## 2017-01-05 DIAGNOSIS — E739 Lactose intolerance, unspecified: Secondary | ICD-10-CM | POA: Diagnosis not present

## 2017-01-05 DIAGNOSIS — R238 Other skin changes: Secondary | ICD-10-CM | POA: Diagnosis not present

## 2017-01-05 DIAGNOSIS — R233 Spontaneous ecchymoses: Secondary | ICD-10-CM

## 2017-01-05 LAB — TSH: TSH: 2.03 u[IU]/mL (ref 0.35–4.50)

## 2017-01-05 LAB — CBC WITH DIFFERENTIAL/PLATELET
BASOS ABS: 0.1 10*3/uL (ref 0.0–0.1)
Basophils Relative: 1.5 % (ref 0.0–3.0)
Eosinophils Absolute: 0.3 10*3/uL (ref 0.0–0.7)
Eosinophils Relative: 4.5 % (ref 0.0–5.0)
HEMATOCRIT: 38.5 % (ref 36.0–46.0)
HEMOGLOBIN: 12.9 g/dL (ref 12.0–15.0)
LYMPHS PCT: 26 % (ref 12.0–46.0)
Lymphs Abs: 1.6 10*3/uL (ref 0.7–4.0)
MCHC: 33.5 g/dL (ref 30.0–36.0)
MCV: 94.1 fl (ref 78.0–100.0)
MONOS PCT: 12.6 % — AB (ref 3.0–12.0)
Monocytes Absolute: 0.8 10*3/uL (ref 0.1–1.0)
NEUTROS ABS: 3.4 10*3/uL (ref 1.4–7.7)
Neutrophils Relative %: 55.4 % (ref 43.0–77.0)
PLATELETS: 267 10*3/uL (ref 150.0–400.0)
RBC: 4.09 Mil/uL (ref 3.87–5.11)
RDW: 12.6 % (ref 11.5–15.5)
WBC: 6.1 10*3/uL (ref 4.0–10.5)

## 2017-01-05 LAB — IBC PANEL
Iron: 131 ug/dL (ref 42–145)
SATURATION RATIOS: 33.8 % (ref 20.0–50.0)
TRANSFERRIN: 277 mg/dL (ref 212.0–360.0)

## 2017-01-05 LAB — FERRITIN: Ferritin: 17.7 ng/mL (ref 10.0–291.0)

## 2017-01-05 NOTE — Assessment & Plan Note (Signed)
Diarrhea resolves with lactose and meat free diet. She prefers formal allergy testing- referral  Placed.

## 2017-01-05 NOTE — Patient Instructions (Signed)
Labs today  Referral to allergist and nutritionist ( Iron diet)

## 2017-01-05 NOTE — Progress Notes (Signed)
Pre visit review using our clinic review tool, if applicable. No additional management support is needed unless otherwise documented below in the visit note. 

## 2017-01-05 NOTE — Progress Notes (Signed)
Subjective:    Patient ID: Melissa Walton, female    DOB: Aug 04, 1990, 26 y.o.   MRN: [id number]  CC: Melissa Walton is a 26 y.o. female who presents today for follow up.   HPI: Complains fatigue, bruising easily for years, unchanged. Primarily here to have anemia testing and would also like thyroid tested.   No sob, syncope, dizziness, epistaxis, heavy menstrual cycles.  Not currently on iron since gave her acid reflux.   Complains of diarrhea for years, however resolved when eating diet free of meat or lactose. No abdominal cramping, weight loss, blood in stool, vomiting. No recent tick bites.    H/o lactose intolerance since highschool ( tested positive in 2012.  Doesn't eat red meats because 'hurts stomach.'         Ortel- 2017- negative von willebrand testing.  HISTORY:  Past Medical History:  Diagnosis Date  . Concussion    high school   No past surgical history on file. Family History  Problem Relation Age of Onset  . Diabetes Mother   . Hyperlipidemia Mother   . Hypertension Father   . Gout Father   . Heart disease Maternal Grandfather   . Ovarian cancer Paternal Grandmother     Allergies: Penicillins Current Outpatient Prescriptions on File Prior to Visit  Medication Sig Dispense Refill  . cetirizine (ZYRTEC) 10 MG tablet Take 10 mg by mouth daily.    . cyclobenzaprine (FLEXERIL) 5 MG tablet Take 1 tablet (5 mg total) by mouth at bedtime as needed for muscle spasms. 15 tablet 0  . ibuprofen (ADVIL,MOTRIN) 200 MG tablet Take by mouth.    . Probiotic Product (PROBIOTIC-10 PO) Take by mouth.    . [DISCONTINUED] omeprazole (PRILOSEC OTC) 20 MG tablet Take 1 tablet (20 mg total) by mouth daily. 30 tablet 6   No current facility-administered medications on file prior to visit.     Social History  Substance Use Topics  . Smoking status: Never Smoker  . Smokeless tobacco: Never Used  . Alcohol use No    Review of Systems  Constitutional: Positive  for fatigue. Negative for chills and fever.  Respiratory: Negative for cough and shortness of breath.   Cardiovascular: Negative for chest pain and palpitations.  Gastrointestinal: Negative for nausea and vomiting.  Neurological: Negative for dizziness, syncope and headaches.  Hematological: Negative for adenopathy. Bruises/bleeds easily.      Objective:    BP 124/76   Pulse 72   Temp 98.2 F (36.8 C) (Oral)   Ht 5\' 7"  (1.702 m)   Wt 209 lb 9.6 oz (95.1 kg)   SpO2 99%   BMI 32.83 kg/m  BP Readings from Last 3 Encounters:  01/05/17 124/76  11/03/16 110/60  02/12/15 126/84   Wt Readings from Last 3 Encounters:  01/05/17 209 lb 9.6 oz (95.1 kg)  11/03/16 212 lb 6.4 oz (96.3 kg)  02/12/15 179 lb 8 oz (81.4 kg)    Physical Exam  Constitutional: She appears well-developed and well-nourished.  Eyes: Conjunctivae are normal.  Neck: No thyroid mass and no thyromegaly present.  Cardiovascular: Normal rate, regular rhythm, normal heart sounds and normal pulses.   Pulmonary/Chest: Effort normal and breath sounds normal. She has no wheezes. She has no rhonchi. She has no rales.  Lymphadenopathy:       Head (right side): No submental, no submandibular, no tonsillar, no preauricular, no posterior auricular and no occipital adenopathy present.       Head (left side):  No submental, no submandibular, no tonsillar, no preauricular, no posterior auricular and no occipital adenopathy present.    She has no cervical adenopathy.  Neurological: She is alert.  Skin: Skin is warm and dry.  Psychiatric: She has a normal mood and affect. Her speech is normal and behavior is normal. Thought content normal.  Vitals reviewed.      Assessment & Plan:   Problem List Items Addressed This Visit      Digestive   Lactose intolerance - Primary    Diarrhea resolves with lactose and meat free diet. She prefers formal allergy testing- referral  Placed.       Relevant Orders   Ambulatory referral to  Allergy   Referral to Nutrition and Diabetes Services     Other   Easy bruising    Fatigue, easy bruising. Reviewed Ortel's note. Pending anemia studies. Not currently on iron  However since doesn't eat meat, have referred to nutrition for education regarding iron rich foods.       Relevant Orders   Ambulatory referral to Allergy   TSH   CBC with Differential/Platelet   Ferritin   IBC panel   Referral to Nutrition and Diabetes Services       I am having Melissa Walton maintain her ibuprofen, cetirizine, Probiotic Product (PROBIOTIC-10 PO), and cyclobenzaprine.   No orders of the defined types were placed in this encounter.   Return precautions given.   Risks, benefits, and alternatives of the medications and treatment plan prescribed today were discussed, and patient expressed understanding.   Education regarding symptom management and diagnosis given to patient on AVS.  Continue to follow with [name], FNP for routine health maintenance.   Melissa Walton and I agreed with plan.   Rennie Plowman, FNP

## 2017-01-05 NOTE — Assessment & Plan Note (Signed)
Fatigue, easy bruising. Reviewed Ortel's note. Pending anemia studies. Not currently on iron  However since doesn't eat meat, have referred to nutrition for education regarding iron rich foods.

## 2017-02-25 ENCOUNTER — Telehealth: Payer: Self-pay

## 2017-02-25 NOTE — Telephone Encounter (Signed)
Copied from CRM #[id number]. Topic: Referral - Status >> Feb 25, 2017 12:32 PM Oneal GroutSebastian, Jennifer S wrote: Reason for CRM: Requesting status of allergy referral, states no one has called her. Please advise

## 2017-04-22 ENCOUNTER — Encounter: Payer: Self-pay | Admitting: Family Medicine

## 2017-04-22 ENCOUNTER — Ambulatory Visit: Payer: BC Managed Care – PPO | Admitting: Family Medicine

## 2017-04-22 VITALS — BP 106/64 | HR 85 | Temp 98.5°F | Ht 67.0 in | Wt 218.8 lb

## 2017-04-22 DIAGNOSIS — R350 Frequency of micturition: Secondary | ICD-10-CM

## 2017-04-22 LAB — POCT URINALYSIS DIPSTICK
Bilirubin, UA: NEGATIVE
GLUCOSE UA: NEGATIVE
KETONES UA: NEGATIVE
Leukocytes, UA: NEGATIVE
Nitrite, UA: NEGATIVE
PH UA: 6 (ref 5.0–8.0)
Protein, UA: NEGATIVE
RBC UA: NEGATIVE
SPEC GRAV UA: 1.015 (ref 1.010–1.025)
UROBILINOGEN UA: 0.2 U/dL

## 2017-04-22 NOTE — Progress Notes (Signed)
Patient ID: Melissa Walton, female   DOB: 1990-05-25, 27 y.o.   MRN: [id number]    PCP: Allegra GranaArnett, Margaret G, FNP  Subjective:  Melissa Walton is a 27 y.o. year old very pleasant female patient who presents with Urinary Tract symptoms: symptoms including urinary frequency, and mild pruritis intermittently -started: one week  Symptom of pruritis improved and frequency is intermittent -previous treatments: AZO and monistat have provided benefit Associated  Mild suprapubic discomfort Denies polyuria, polydipsia, or polyphagia, vaginal discharge History of constipation Last BM today; soft and formed, no blood  Caffeine intake: one soda a day. Drinks water  She is not a smoker Irregular menstrual cycles are noted; she denies sexual activity and denies ever being sexually active  ROS-denies fever, chills, sweats, N/V, flank pain, or blood in urine  Pertinent Past Medical History- GERD, Lactose Intolerance  Medications- reviewed  Current Outpatient Medications  Medication Sig Dispense Refill  . cetirizine (ZYRTEC) 10 MG tablet Take 10 mg by mouth daily.    Marland Kitchen. ibuprofen (ADVIL,MOTRIN) 200 MG tablet Take by mouth.    . Probiotic Product (PROBIOTIC-10 PO) Take by mouth.     No current facility-administered medications for this visit.     Objective: BP 106/64 (BP Location: Left Arm, Patient Position: Sitting, Cuff Size: Normal)   Pulse 85   Temp 98.5 F (36.9 C) (Oral)   Ht 5\' 7"  (1.702 m)   Wt 218 lb 12.8 oz (99.2 kg)   LMP 01/12/2017   SpO2 98%   BMI 34.27 kg/m  Gen: NAD, resting comfortably HEENT: Turbinates erythematous,  Oropharynx clear and moist. CV: RRR no murmurs rubs or gallops Lungs: CTAB no crackles, wheeze, rhonchi Abdomen: soft/nontender/nondistended/normal bowel sounds. No rebound or guarding.  No CVA tenderness.   Suprapubic tenderness minimally present Ext: no edema Skin: warm, dry, no rash Neuro: grossly normal, moves all extremities  Assessment/Plan:  1.  Urinary frequency UA is unremarkable; negative for glucose; no polydipsia, polyuria, or polyphagia; no vaginal discharge; urinary frequency is intermittent in nature; will send for culture which will determine further treatment if needed. Advised patient to see gynecology for routine care and evaluation of irregular menses. She declined referral or pelvic exam today. She reports no sexual activity and declines further testing for STIs.  Advised follow up if symptoms do not improve, worsen, or she develops a fever  >101 or flank pain.  She will make an appointment with gynecology and will call office if a referral is needed. She declined referral today as she would like to speak with her mother first to discuss provider options.  - POCT urinalysis dipstick   Finally, we reviewed reasons to return to care including if symptoms worsen or persist or new concerns arise- once again particularly fever, N/V, or flank pain.   Inez CatalinaJulia Ann Markham Dumlao, FNP

## 2017-04-22 NOTE — Patient Instructions (Signed)
Please follow up if you develop symptoms of fever >101, pain in your back, or do not improve in 3 to 4 days.  Please make an appointment with gynecology for further evaluation of irregular menstrual cycles and routine care.  Your urine will be cultured and we will contact you with these results.   Urinary Frequency, Adult Urinary frequency means urinating more often than usual. People with urinary frequency urinate at least 8 times in 24 hours, even if they drink a normal amount of fluid. Although they urinate more often than normal, the total amount of urine produced in a day may be normal. Urinary frequency is also called pollakiuria. What are the causes? This condition may be caused by:  A urinary tract infection.  Obesity.  Bladder problems, such as bladder stones.  Caffeine or alcohol.  Eating food or drinking fluids that irritate the bladder. These include coffee, tea, soda, artificial sweeteners, citrus, tomato-based foods, and chocolate.  Certain medicines, such as medicines that help the body get rid of extra fluid (diuretics).  Muscle or nerve weakness.  Overactive bladder.  Chronic diabetes.  Interstitial cystitis.  In men, problems with the prostate, such as an enlarged prostate.  In women, pregnancy.  In some cases, the cause may not be known. What increases the risk? This condition is more likely to develop in:  Women who have gone through menopause.  Men with prostate problems.  People with a disease or injury that affects the nerves or spinal cord.  People who have or have had a condition that affects the brain, such as a stroke.  What are the signs or symptoms? Symptoms of this condition include:  Feeling an urgent need to urinate often. The stress and anxiety of needing to find a bathroom quickly can make this urge worse.  Urinating 8 or more times in 24 hours.  Urinating as often as every 1 to 2 hours.  How is this diagnosed? This condition  is diagnosed based on your symptoms, your medical history, and a physical exam. You may have tests, such as:  Blood tests.  Urine tests.  Imaging tests, such as X-rays or ultrasounds.  A bladder test.  A test of your neurological system. This is the body system that senses the need to urinate.  A test to check for problems in the urethra and bladder called cystoscopy.  You may also be asked to keep a bladder diary. A bladder diary is a record of what you eat and drink, how often you urinate, and how much you urinate. You may need to see a health care provider who specializes in conditions of the urinary tract (urologist) or kidneys (nephrologist). How is this treated? Treatment for this condition depends on the cause. Sometimes the condition goes away on its own and treatment is not necessary. If treatment is needed, it may include:  Taking medicine.  Learning exercises that strengthen the muscles that help control urination.  Following a bladder training program. This may include: ? Learning to delay going to the bathroom. ? Double urinating (voiding). This helps if you are not completely emptying your bladder. ? Scheduled voiding.  Making diet changes, such as: ? Avoiding caffeine. ? Drinking fewer fluids, especially alcohol. ? Not drinking in the evening. ? Not having foods or drinks that may irritate the bladder. ? Eating foods that help prevent or ease constipation. Constipation can make this condition worse.  Having the nerves in your bladder stimulated. There are two options for stimulating  the nerves to your bladder: ? Outpatient electrical nerve stimulation. This is done by your health care provider. ? Surgery to implant a bladder pacemaker. The pacemaker helps to control the urge to urinate.  Follow these instructions at home:  Keep a bladder diary if told to by your health care provider.  Take over-the-counter and prescription medicines only as told by your health  care provider.  Do any exercises as told by your health care provider.  Follow a bladder training program as told by your health care provider.  Make any recommended diet changes.  Keep all follow-up visits as told by your health care provider. This is important. Contact a health care provider if:  You start urinating more often.  You feel pain or irritation when you urinate.  You notice blood in your urine.  Your urine looks cloudy.  You develop a fever.  You begin vomiting. Get help right away if:  You are unable to urinate. This information is not intended to replace advice given to you by your health care provider. Make sure you discuss any questions you have with your health care provider. Document Released: 12/27/2008 Document Revised: 04/03/2015 Document Reviewed: 09/26/2014 Elsevier Interactive Patient Education  [employer]2018 [employer]

## 2017-04-23 LAB — URINE CULTURE
MICRO NUMBER:: 90166690
SPECIMEN QUALITY: ADEQUATE

## 2018-03-26 ENCOUNTER — Encounter: Payer: Self-pay | Admitting: Family

## 2018-03-26 ENCOUNTER — Telehealth: Payer: Managed Care, Other (non HMO) | Admitting: Nurse Practitioner

## 2018-03-26 DIAGNOSIS — J111 Influenza due to unidentified influenza virus with other respiratory manifestations: Secondary | ICD-10-CM | POA: Diagnosis not present

## 2018-03-26 MED ORDER — OSELTAMIVIR PHOSPHATE 75 MG PO CAPS: 75.0000 mg | ORAL_CAPSULE | Freq: Two times a day (BID) | ORAL | 0 refills | Status: DC

## 2018-03-26 NOTE — Progress Notes (Signed)

## 2018-03-27 ENCOUNTER — Telehealth: Payer: Managed Care, Other (non HMO) | Admitting: Physician Assistant

## 2018-03-27 DIAGNOSIS — J019 Acute sinusitis, unspecified: Secondary | ICD-10-CM

## 2018-03-27 DIAGNOSIS — B9789 Other viral agents as the cause of diseases classified elsewhere: Secondary | ICD-10-CM

## 2018-03-27 MED ORDER — IPRATROPIUM BROMIDE 0.03 % NA SOLN: 2.0000 | Freq: Two times a day (BID) | NASAL | 0 refills | Status: DC

## 2018-03-27 NOTE — Progress Notes (Signed)

## 2018-03-28 ENCOUNTER — Ambulatory Visit: Payer: Self-pay | Admitting: Family

## 2018-03-30 ENCOUNTER — Encounter: Payer: Self-pay | Admitting: Family

## 2018-03-30 ENCOUNTER — Ambulatory Visit (INDEPENDENT_AMBULATORY_CARE_PROVIDER_SITE_OTHER): Payer: Managed Care, Other (non HMO) | Admitting: Family

## 2018-03-30 VITALS — BP 122/88 | HR 84 | Temp 98.0°F | Wt 200.8 lb

## 2018-03-30 DIAGNOSIS — J40 Bronchitis, not specified as acute or chronic: Secondary | ICD-10-CM

## 2018-03-30 MED ORDER — BENZONATATE 100 MG PO CAPS: 100.0000 mg | ORAL_CAPSULE | Freq: Two times a day (BID) | ORAL | 1 refills | Status: DC | PRN

## 2018-03-30 NOTE — Patient Instructions (Signed)
I would advise you to stay the course, plenty of rest, please double check what you are taking when you get home.  I suspect it may be Augmentin which accommodation between amoxicillin and clavulanate.  This is a very good sinus medication. Use Tessalon Perles as needed. Please let me know if you are not better.

## 2018-03-30 NOTE — Progress Notes (Signed)
Subjective:    Patient ID: Melissa Walton, female    DOB: [birth year], 28 y.o.   MRN: [id number]  CC: Melissa Walton is a 28 y.o. female who presents today for an acute visit.    HPI: Cough x 4 days, unchanged.   Cough late evenings Melissa sporadic throughout evening. Endorses sinus pressure, ear pain.  Has tried robitussin without relief. Vicks cough drop.   5 days ago had 100.2 tmax. Never started tamiflu. Had 100 tmax yesterday. No sore throat. No cp, sob, wheezing.   Has done e visits, [facility] 4 days ago, started on amoxicillin, unsure,  per patient.   H/o GERD- takes prn 'acid reflux medication.' No epigastric burning, belching, this past week.  No h/o smoking, asthma.   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      1/12-ipratropium bromide 1/11- tamiflu  HISTORY:  Past Medical History:  Diagnosis Date  . Concussion    high school   History reviewed. No pertinent surgical history. Family History  Problem Relation Age of Onset  . Diabetes Mother   . Hyperlipidemia Mother   . Hypertension Father   . Gout Father   . Heart disease Maternal Grandfather   . Ovarian cancer Paternal Grandmother     Allergies: Penicillins Current Outpatient Medications on File Prior to Visit  Medication Sig Dispense Refill  . Melissa Walton (ZYRTEC) 10 MG tablet Take 10 mg by mouth daily.    Marland Kitchen. Walton (ADVIL,MOTRIN) 200 MG tablet Take by mouth.    Marland Kitchen. ipratropium (ATROVENT) 0.03 % nasal spray Place 2 sprays into both nostrils every 12 (twelve) hours. 30 mL 0  . Melissa Product (Melissa-10 PO) Take by mouth.    . Melissa Walton (TAMIFLU) 75 MG capsule Take 1 capsule (75 mg total) by mouth 2 (two) times daily. (Patient not taking: Reported on 03/30/2018) 10 capsule 0  . [DISCONTINUED] omeprazole (PRILOSEC OTC) 20 MG tablet Take 1 tablet (20 mg total) by mouth daily. 30 tablet 6   No current facility-administered medications on file prior to visit.     Social History   Tobacco Use  . Smoking  status: Never Smoker  . Smokeless tobacco: Never Used  Substance Use Topics  . Alcohol use: No  . Drug use: No    Review of Systems  Constitutional: Negative for chills. Fever: tmax 100.2.  HENT: Positive for congestion. Negative for sinus pressure, sinus pain Melissa trouble swallowing.   Respiratory: Positive for cough. Negative for shortness of breath.   Cardiovascular: Negative for chest pain Melissa palpitations.  Gastrointestinal: Negative for nausea Melissa vomiting.      Objective:    BP 122/88 (BP Location: Left Arm, Patient Position: Sitting, Cuff Size: Normal)   Pulse 84   Temp 98 F (36.7 C)   Wt 200 lb 12.8 oz (91.1 kg)   SpO2 100%   BMI 31.45 kg/m    Physical Exam Vitals signs reviewed.  Constitutional:      Appearance: She is well-developed.  HENT:     Head: Normocephalic Melissa atraumatic.     Right Ear: Hearing, tympanic membrane, ear canal Melissa external ear normal. No decreased hearing noted. No drainage, swelling or tenderness. No middle ear effusion. No foreign body. Tympanic membrane is not erythematous or bulging.     Left Ear: Hearing, tympanic membrane, ear canal Melissa external ear normal. No decreased hearing noted. No drainage, swelling or tenderness.  No middle ear effusion. No foreign body. Tympanic membrane is not erythematous or bulging.  Nose: Nose normal. No rhinorrhea.     Right Sinus: No maxillary sinus tenderness or frontal sinus tenderness.     Left Sinus: No maxillary sinus tenderness or frontal sinus tenderness.     Mouth/Throat:     Pharynx: Uvula midline. No oropharyngeal exudate or posterior oropharyngeal erythema.     Tonsils: No tonsillar abscesses.  Eyes:     Conjunctiva/sclera: Conjunctivae normal.  Cardiovascular:     Rate Melissa Rhythm: Regular rhythm.     Pulses: Normal pulses.     Heart sounds: Normal heart sounds.  Pulmonary:     Effort: Pulmonary effort is normal.     Breath sounds: Normal breath sounds. No wheezing, rhonchi or rales.    Lymphadenopathy:     Head:     Right side of head: No submental, submandibular, tonsillar, preauricular, posterior auricular or occipital adenopathy.     Left side of head: No submental, submandibular, tonsillar, preauricular, posterior auricular or occipital adenopathy.     Cervical: No cervical adenopathy.  Skin:    General: Skin is warm Melissa dry.  Neurological:     Mental Status: She is alert.  Psychiatric:        Speech: Speech normal.        Behavior: Behavior normal.        Thought Content: Thought content normal.        Assessment & Plan:   1. Bronchitis Patient is well-appearing, she is nontoxic in appearance.  Benign exam.  She is afebrile.  Duration cough for days.  She is currently on , she thinks, Augmentin.  Advised for plenty of rest, Tessalon Perles as needed.  She will let me know if no improvement  - benzonatate (TESSALON) 100 MG capsule; Take 1 capsule (100 mg total) by mouth 2 (two) times daily as needed for cough.  Dispense: 20 capsule; Refill: 1    I am having Melissa Walton, Melissa Walton, Melissa Product (Melissa-10 PO), Melissa Walton, Melissa ipratropium.   No orders of the defined types were placed in this encounter.   Return precautions given.   Risks, benefits, Melissa alternatives of the medications Melissa treatment plan prescribed today were discussed, Melissa patient expressed understanding.   Education regarding symptom management Melissa diagnosis given to patient on AVS.  Continue to follow with Allegra GranaArnett, Kael Keetch G, FNP for routine health maintenance.   Melissa Walton Melissa I agreed with plan.   Rennie PlowmanMargaret Devera Englander, FNP

## 2018-06-09 ENCOUNTER — Encounter: Payer: Self-pay | Admitting: Family Medicine

## 2018-06-09 ENCOUNTER — Ambulatory Visit (INDEPENDENT_AMBULATORY_CARE_PROVIDER_SITE_OTHER): Payer: Managed Care, Other (non HMO) | Admitting: Family Medicine

## 2018-06-09 ENCOUNTER — Other Ambulatory Visit: Payer: Self-pay

## 2018-06-09 VITALS — BP 118/78 | HR 71 | Temp 98.2°F | Resp 16 | Ht 67.0 in | Wt 194.0 lb

## 2018-06-09 DIAGNOSIS — H00011 Hordeolum externum right upper eyelid: Secondary | ICD-10-CM | POA: Diagnosis not present

## 2018-06-09 DIAGNOSIS — H1031 Unspecified acute conjunctivitis, right eye: Secondary | ICD-10-CM | POA: Diagnosis not present

## 2018-06-09 MED ORDER — POLYMYXIN B-TRIMETHOPRIM 10000-0.1 UNIT/ML-% OP SOLN
1.0000 [drp] | Freq: Four times a day (QID) | OPHTHALMIC | 0 refills | Status: DC
Start: 2018-06-09 — End: 2018-07-22

## 2018-06-09 MED ORDER — CEPHALEXIN 500 MG PO CAPS: 500.0000 mg | ORAL_CAPSULE | Freq: Four times a day (QID) | ORAL | 0 refills | Status: DC

## 2018-06-09 NOTE — Progress Notes (Signed)
Subjective:    Patient ID: Melissa Walton, female    DOB: 06/27/1990, 28 y.o.   MRN: [id number]  HPI   Patient presents to clinic due to redness, swelling, discharge from right eye.  States right eye itched a little yesterday, but upon waking this morning it was swollen and red and had discharge.  Denies being around anyone who has had pinkeye.  Denies fever or chills.  Denies cough or shortness of breath.  Denies any loss of vision, states vision in her right eye is slightly blurry due to eyelid being swollen, but if she pulls up eyelid vision is normal.  Patient Active Problem List   Diagnosis Date Noted  . Lactose intolerance 01/05/2017  . Acute recurrent maxillary sinusitis 02/12/2015  . Easy bruising 01/23/2015  . Overweight (BMI 25.0-29.9) 10/18/2014  . Routine general medical examination at a health care facility 10/18/2014  . GERD (gastroesophageal reflux disease) 01/28/2011   Social History   Tobacco Use  . Smoking status: Never Smoker  . Smokeless tobacco: Never Used  Substance Use Topics  . Alcohol use: No   Review of Systems   Constitutional: Negative for chills, fatigue and fever.  HENT: Negative for congestion, ear pain, sinus pain and sore throat.   Eyes: +itchy, red, swollen Right Eye with discharge.   Respiratory: Negative for cough, shortness of breath and wheezing.   Cardiovascular: Negative for chest pain, palpitations and leg swelling.  Gastrointestinal: Negative for abdominal pain, diarrhea, nausea and vomiting.  Genitourinary: Negative for dysuria, frequency and urgency.  Musculoskeletal: Negative for arthralgias and myalgias.  Skin: Negative for color change, pallor and rash.  Neurological: Negative for syncope, light-headedness and headaches.  Psychiatric/Behavioral: The patient is not nervous/anxious.       Objective:   Physical Exam Vitals signs and nursing note reviewed.  Constitutional:      General: She is not in acute distress.  Appearance: She is not toxic-appearing.  HENT:     Head: Normocephalic and atraumatic.     Right Ear: Tympanic membrane, ear canal and external ear normal.     Left Ear: Tympanic membrane, ear canal and external ear normal.     Nose: Nasal tenderness (right side of nose) present.     Right Sinus: Maxillary sinus tenderness present.     Mouth/Throat:     Mouth: Mucous membranes are moist.  Eyes:     General: No scleral icterus.       Right eye: Discharge (tan discharge) and hordeolum (upper eyelid, inner corner) present.        Left eye: No discharge.     Extraocular Movements: Extraocular movements intact.     Conjunctiva/sclera:     Right eye: Right conjunctiva is injected (swollen upper eyelid).     Pupils: Pupils are equal, round, and reactive to light.  Neck:     Musculoskeletal: Neck supple. No neck rigidity.  Cardiovascular:     Rate and Rhythm: Normal rate.  Pulmonary:     Effort: Pulmonary effort is normal. No respiratory distress.     Breath sounds: Normal breath sounds.  Lymphadenopathy:     Cervical: No cervical adenopathy.  Neurological:     Mental Status: She is alert and oriented to person, place, and time.  Psychiatric:        Mood and Affect: Mood normal.        Behavior: Behavior normal.     Vitals:   06/09/18 0915  BP: 118/78  Pulse: 71  Resp: 16  Temp: 98.2 F (36.8 C)  SpO2: 99%       Assessment & Plan:   Right eye conjunctivitis, stye right eye- due to patient's swelling of right eye, or eye discharge and tenderness of right side of nose/right maxillary sinus area we will treat with oral Keflex 4 times daily for 10 days as well as Polytrim eyedrops.  Patient given out of work note for the next 2 days to allow time for eye to return back to normal.  Advised to dispose of any recently used eye make-up and not use any eye make-up for at least the next week.  Also advised to change pillowcase after 24 hours of medication.  Advised if she does have to  wipe I used a new tissue each time or use a new corner of the towel/tissue when wiping and always wipe from inner to outer corner.  Strongly encouraged to avoid rubbing eye.  Discussed good handwashing and sanitizing commonly use surfaces.  Strict return precautions given, advised if vision worsens, develops loss of visual fields, swelling of eye increases rather than improves to call office, eye doctor and/or go to urgent care/ER right away.  Patient verbalizes understanding.  Patient will otherwise keep regularly scheduled follow-up visit with PCP as planned.  She will return to clinic sooner if any issues arise.

## 2018-06-09 NOTE — Patient Instructions (Addendum)
Bacterial Conjunctivitis, Adult Bacterial conjunctivitis is an infection of your conjunctiva. This is the clear membrane that covers the white part of your eye and the inner part of your eyelid. This infection can make your eye:  Red or pink.  Itchy. This condition spreads easily from person to person (is contagious) and from one eye to the other eye. What are the causes?  This condition is caused by germs (bacteria). You may get the infection if you come into close contact with: ? A person who has the infection. ? Items that have germs on them (are contaminated), such as face towels, contact lens solution, or eye makeup. What increases the risk? You are more likely to get this condition if you:  Have contact with people who have the infection.  Wear contact lenses.  Have a sinus infection.  Have had a recent eye injury or surgery.  Have a weak body defense system (immune system).  Have dry eyes. What are the signs or symptoms?   Thick, yellowish discharge from the eye.  Tearing or watery eyes.  Itchy eyes.  Burning feeling in your eyes.  Eye redness.  Swollen eyelids.  Blurred vision. How is this treated?   Antibiotic eye drops or ointment.  Antibiotic medicine taken by mouth. This is used for infections that do not get better with drops or ointment or that last more than 10 days.  Cool, wet cloths placed on the eyes.  Artificial tears used 2-6 times a day. Follow these instructions at home: Medicines  Take or apply your antibiotic medicine as told by your doctor. Do not stop taking or applying the antibiotic even if you start to feel better.  Take or apply over-the-counter and prescription medicines only as told by your doctor.  Do not touch your eyelid with the eye-drop bottle or the ointment tube. Managing discomfort  Wipe any fluid from your eye with a warm, wet washcloth or a cotton ball.  Place a clean, cool, wet cloth on your eye. Do this for  10-20 minutes, 3-4 times per day. General instructions  Do not wear contacts until the infection is gone. Wear glasses until your doctor says it is okay to wear contacts again.  Do not wear eye makeup until the infection is gone. Throw away old eye makeup.  Change or wash your pillowcase every day.  Do not share towels or washcloths.  Wash your hands often with soap and water. Use paper towels to dry your hands.  Do not touch or rub your eyes.  Do not drive or use heavy machinery if your vision is blurred. Contact a doctor if:  You have a fever.  You do not get better after 10 days. Get help right away if:  You have a fever and your symptoms get worse all of a sudden.  You have very bad pain when you move your eye.  Your face: ? Hurts. ? Is red. ? Is swollen.  You have sudden loss of vision. Summary  Bacterial conjunctivitis is an infection of your conjunctiva.  This infection spreads easily from person to person.  Wash your hands often with soap and water. Use paper towels to dry your hands.  Take or apply your antibiotic medicine as told by your doctor.  Contact a doctor if you have a fever or you do not get better after 10 days. This information is not intended to replace advice given to you by your health care provider. Make sure you discuss any   questions you have with your health care provider. Document Released: 12/10/2007 Document Revised: 10/06/2017 Document Reviewed: 10/06/2017 Elsevier Interactive Patient Education  2019 [employer] Stye  A stye, also known as a hordeolum, is a bump that forms on an eyelid. It may look like a pimple next to the eyelash. A stye can form inside the eyelid (internal stye) or outside the eyelid (external stye). A stye can cause redness, swelling, and pain on the eyelid. Styes are very common. Anyone can get them at any age. They usually occur in just one eye, but you may have more than one in either eye. What are the  causes? A stye is caused by an infection. The infection is almost always caused by bacteria called Staphylococcus aureus. This is a common type of bacteria that lives on the skin. An internal stye may result from an infected oil-producing gland inside the eyelid. An external stye may be caused by an infection at the base of the eyelash (hair follicle). What increases the risk? You are more likely to develop a stye if:  You have had a stye before.  You have any of these conditions: ? Diabetes. ? Red, itchy, inflamed eyelids (blepharitis). ? A skin condition such as seborrheic dermatitis or rosacea. ? High fat levels in your blood (lipids). What are the signs or symptoms? The most common symptom of a stye is eyelid pain. Internal styes are more painful than external styes. Other symptoms may include:  Painful swelling of your eyelid.  A scratchy feeling in your eye.  Tearing and redness of your eye.  Pus draining from the stye. How is this diagnosed? Your health care provider may be able to diagnose a stye just by examining your eye. The health care provider may also check to make sure:  You do not have a fever or other signs of a more serious infection.  The infection has not spread to other parts of your eye or areas around your eye. How is this treated? Most styes will clear up in a few days without treatment or with warm compresses applied to the area. You may need to use antibiotic drops or ointment to treat an infection. In some cases, if your stye does not heal with routine treatment, your health care provider may drain pus from the stye using a thin blade or needle. This may be done if the stye is large, causing a lot of pain, or affecting your vision. Follow these instructions at home:  Take over-the-counter and prescription medicines only as told by your health care provider. This includes eye drops or ointments.  If you were prescribed an antibiotic medicine, apply or use  it as told by your health care provider. Do not stop using the antibiotic even if your condition improves.  Apply a warm, wet cloth (warm compress) to your eye for 5-10 minutes, 4 times a day.  Clean the affected eyelid as directed by your health care provider.  Do not wear contact lenses or eye makeup until your stye has healed.  Do not try to pop or drain the stye.  Do not rub your eye. Contact a health care provider if:  You have chills or a fever.  Your stye does not go away after several days.  Your stye affects your vision.  Your eyeball becomes swollen, red, or painful. Get help right away if:  You have pain when moving your eye around. Summary  A stye is a bump that forms on an  eyelid. It may look like a pimple next to the eyelash.  A stye can form inside the eyelid (internal stye) or outside the eyelid (external stye). A stye can cause redness, swelling, and pain on the eyelid.  Your health care provider may be able to diagnose a stye just by examining your eye.  Apply a warm, wet cloth (warm compress) to your eye for 5-10 minutes, 4 times a day. This information is not intended to replace advice given to you by your health care provider. Make sure you discuss any questions you have with your health care provider. Document Released: 12/10/2004 Document Revised: 11/12/2016 Document Reviewed: 11/12/2016 Elsevier Interactive Patient Education  2019 [employer].

## 2018-06-29 ENCOUNTER — Telehealth: Payer: Managed Care, Other (non HMO) | Admitting: Nurse Practitioner

## 2018-06-29 DIAGNOSIS — N3000 Acute cystitis without hematuria: Secondary | ICD-10-CM | POA: Diagnosis not present

## 2018-06-29 MED ORDER — NITROFURANTOIN MONOHYD MACRO 100 MG PO CAPS: 100.0000 mg | ORAL_CAPSULE | Freq: Two times a day (BID) | ORAL | 0 refills | Status: DC

## 2018-06-29 MED ORDER — FLUCONAZOLE 150 MG PO TABS: 150.0000 mg | ORAL_TABLET | Freq: Once | ORAL | 0 refills | Status: AC

## 2018-06-29 NOTE — Progress Notes (Signed)
We are sorry that you are not feeling well.  Here is how we plan to help!  Based on what you shared with me it looks like you most likely have a simple urinary tract infection.  A UTI (Urinary Tract Infection) is a bacterial infection of the bladder.  Most cases of urinary tract infections are simple to treat but a key part of your care is to encourage you to drink plenty of fluids and watch your symptoms carefully.  I have prescribed MacroBid 100 mg twice a day for 5 days.  Your symptoms should gradually improve. Call us if the burning in your urine worsens, you develop worsening fever, back pain or pelvic pain or if your symptoms do not resolve after completing the antibiotic. * I have also sent in a diflucan 150mg  1 po. Urinary tract infections can be prevented by drinking plenty of water to keep your body hydrated.  Also be sure when you wipe, wipe from front to back and don't hold it in!  If possible, empty your bladder every 4 hours.  Your e-visit answers were reviewed by a board certified advanced clinical practitioner to complete your personal care plan.  Depending on the condition, your plan could have included both over the counter or prescription medications.  If there is a problem please reply  once you have received a response from your provider.  Your safety is important to [place].  If you have drug allergies check your prescription carefully.    You can use MyChart to ask questions about today's visit, request a non-urgent call back, or ask for a work or school excuse for 24 hours related to this e-Visit. If it has been greater than 24 hours you will need to follow up with your provider, or enter a new e-Visit to address those concerns.   You will get an e-mail in the next two days asking about your experience.  I hope that your e-visit has been valuable and will speed your recovery. Thank you for using e-visits.   5 minutes spent reviewing and documenting in chart.

## 2018-07-22 ENCOUNTER — Telehealth: Payer: Self-pay | Admitting: Family

## 2018-07-22 ENCOUNTER — Encounter: Payer: Self-pay | Admitting: Family

## 2018-07-22 ENCOUNTER — Ambulatory Visit (INDEPENDENT_AMBULATORY_CARE_PROVIDER_SITE_OTHER): Payer: Managed Care, Other (non HMO) | Admitting: Family

## 2018-07-22 ENCOUNTER — Telehealth: Payer: Self-pay | Admitting: Obstetrics and Gynecology

## 2018-07-22 DIAGNOSIS — R102 Pelvic and perineal pain: Secondary | ICD-10-CM | POA: Diagnosis not present

## 2018-07-22 NOTE — Telephone Encounter (Signed)
Pt with urin sx. Had eVisit with PCP 4/20 and treated with macrobid and diflucan. Sx improved slightly but then recurred, but pt hasn't follow back up with them. Pt hasn't been seen at [facility] for several yrs, so would need appt. Since already treated by PCP, suggested she call them back because they may retreat her. If she can't get in to see them, we're happy to see her in office. Pt agrees with plan.

## 2018-07-22 NOTE — Telephone Encounter (Signed)
Left a message on pt's voicemail informing her to call the PCP that prescribed her the medication for a UTI and they will retreat her with a stronger or different medication or she can call back to [place] and schedule a virtual visit.  Zola Runion,cma

## 2018-07-22 NOTE — Telephone Encounter (Unsigned)
Copied from CRM [id number]. Topic: General - Other >> Jul 22, 2018 10:10 AM [name] A wrote: Reason for CRM: Pt called stating her UTI has not gotten better with the medication she was prescribed. Pt states she has been off of medication for a while. Please advise with next steps.

## 2018-07-22 NOTE — Progress Notes (Signed)
This visit type was conducted due to national recommendations for restrictions regarding the COVID-19 pandemic (e.g. social distancing).  This format is felt to be most appropriate for this patient at this time.  All issues noted in this document were discussed and addressed.  No physical exam was performed (except for noted visual exam findings with Video Visits). Virtual Visit via Video Note  I connected with@  on 07/22/18 at  3:30 PM EDT by a video enabled telemedicine application and verified that I am speaking with the correct person using two identifiers.  Location patient: home Location provider:work Persons participating in the virtual visit: patient, provider  I discussed the limitations of evaluation and management by telemedicine and the availability of in person appointments. The patient expressed understanding and agreed to proceed.  Interactive audio and video telecommunications were attempted between this provider and patient, however failed, due to patient having technical difficulties or patient did not have access to video capability.  We continued and completed visit with audio only.    HPI:  CC: painful urination and urinary frequency,one year, unchanged. Doesn't feel she needs urgent imaging or to be seen in ED today.   Seen at [facility] in Fall 2019. Reports treated for STD . Vaginal itching outside of vagina 'always'. No rash. No changes to vaginal discharge, nor purulent discharge. NO blood in urine. No h/o kidney stone.   One month has right mid back pain, unchanged. Seeing chiropracter for this. Worse at night. Not migratory.  Not sexually active. LMP 2 weeks. NO concerns for pregnancy. No birth control.   Suprapubic 'ache over my ovaries'.  H/o ovarian cyst.  No abdominal pain, fever, N, v. Felt better on macrobid but never completely better.   Pelvic US 2012- normal. Similar symptoms at that time.   Seen at urgent care, 4/15//20 macrobid, diflucan.  No  improvement with diflucan    ROS: See pertinent positives and negatives per HPI.  Past Medical History:  Diagnosis Date  . Concussion    high school    History reviewed. No pertinent surgical history.  Family History  Problem Relation Age of Onset  . Diabetes Mother   . Hyperlipidemia Mother   . Hypertension Father   . Gout Father   . Heart disease Maternal Grandfather   . Ovarian cancer Paternal Grandmother     SOCIAL HX: never smoker   Current Outpatient Medications:  .  cetirizine (ZYRTEC) 10 MG tablet, Take 10 mg by mouth daily., Disp: , Rfl:  .  ibuprofen (ADVIL,MOTRIN) 200 MG tablet, Take by mouth., Disp: , Rfl:  .  Probiotic Product (PROBIOTIC-10 PO), Take by mouth., Disp: , Rfl:    ASSESSMENT AND PLAN:  Discussed the following assessment and plan:  Suprapubic pressure - Plan: US Pelvic Complete With Transvaginal, Urine Microscopic, Urine cytology ancillary only(), Urine Culture Problem List Items Addressed This Visit      Other   Suprapubic pressure - Primary    Chronic. Patient doesn't feel needs urgent imaging or to be seen in ED today. Will await pelvic US and urine studies prior to treatment. Based on chronicity, if no diagnosis from work up , will refer her to URO-GYN, which patient agreed with.       Relevant Orders   US Pelvic Complete With Transvaginal   Urine Microscopic   Urine cytology ancillary only()   Urine Culture         I discussed the assessment and treatment plan with the  patient. The patient was provided an opportunity to ask questions and all were answered. The patient agreed with the plan and demonstrated an understanding of the instructions.   The patient was advised to call back or seek an in-person evaluation if the symptoms worsen or if the condition fails to improve as anticipated.   Mable Paris, FNP

## 2018-07-22 NOTE — Patient Instructions (Signed)
Let's start with Urine studies as discussed today. We will call you Monday to schedule.   Today we discussed referrals, orders. Ultrasound of pelvic   I have placed these orders in the system for you.  Please be sure to give [place] a call if you have not heard from our office regarding this. We should hear from [place] within ONE week with information regarding your appointment. If not, please let me know immediately.     Let me know of any new or worsening symptoms.

## 2018-07-25 ENCOUNTER — Telehealth: Payer: Self-pay

## 2018-07-25 ENCOUNTER — Other Ambulatory Visit (INDEPENDENT_AMBULATORY_CARE_PROVIDER_SITE_OTHER): Payer: Managed Care, Other (non HMO)

## 2018-07-25 ENCOUNTER — Other Ambulatory Visit: Payer: Self-pay

## 2018-07-25 ENCOUNTER — Encounter: Payer: Self-pay | Admitting: Family

## 2018-07-25 ENCOUNTER — Other Ambulatory Visit (HOSPITAL_COMMUNITY)
Admission: RE | Admit: 2018-07-25 | Discharge: 2018-07-25 | Disposition: A | Discharge status: Home / Self Care | Payer: Managed Care, Other (non HMO) | Source: Ambulatory Visit | Attending: Family | Admitting: Family

## 2018-07-25 DIAGNOSIS — R102 Pelvic and perineal pain: Secondary | ICD-10-CM | POA: Insufficient documentation

## 2018-07-25 LAB — URINALYSIS, MICROSCOPIC ONLY

## 2018-07-25 NOTE — Assessment & Plan Note (Signed)
Chronic. Patient doesn't feel needs urgent imaging or to be seen in ED today. Will await pelvic US and urine studies prior to treatment. Based on chronicity, if no diagnosis from work up , will refer her to URO-GYN, which patient agreed with.

## 2018-07-25 NOTE — Telephone Encounter (Signed)
-----   Message from [name], FNP sent at 07/25/2018  9:40 AM EDT ----- Call and ensure pt comes in for urine today. Thank you

## 2018-07-25 NOTE — Telephone Encounter (Signed)
Left message for patient to return call back. [facility] may obtain information.  Patient needs a lab appointment so we can schedule urine test.

## 2018-07-26 LAB — URINE CULTURE
MICRO NUMBER:: 462284
SPECIMEN QUALITY:: ADEQUATE

## 2018-07-26 LAB — URINE CYTOLOGY ANCILLARY ONLY
Chlamydia: NEGATIVE
Neisseria Gonorrhea: NEGATIVE
Trichomonas: NEGATIVE

## 2018-07-28 LAB — URINE CYTOLOGY ANCILLARY ONLY
Bacterial vaginitis: NEGATIVE
Candida vaginitis: NEGATIVE

## 2018-07-29 ENCOUNTER — Other Ambulatory Visit: Payer: Self-pay | Admitting: Family

## 2018-07-29 DIAGNOSIS — R102 Pelvic and perineal pain: Secondary | ICD-10-CM

## 2018-08-02 ENCOUNTER — Other Ambulatory Visit: Payer: Self-pay

## 2018-08-02 ENCOUNTER — Ambulatory Visit
Admission: RE | Admit: 2018-08-02 | Discharge: 2018-08-02 | Disposition: A | Discharge status: Home / Self Care | Payer: Managed Care, Other (non HMO) | Source: Ambulatory Visit | Attending: Family | Admitting: Family

## 2018-08-02 DIAGNOSIS — R102 Pelvic and perineal pain: Secondary | ICD-10-CM | POA: Diagnosis present

## 2018-08-12 ENCOUNTER — Telehealth: Payer: Self-pay | Admitting: Family

## 2018-08-12 NOTE — Telephone Encounter (Signed)
Pt is returning Melissa Walton call °

## 2018-08-12 NOTE — Telephone Encounter (Signed)
I called the office of Lysbeth Penner, NP & have faxed over findings. I spoke with receptionist at office & when fax was received she would send a note to [place]. They will let us know if MRI is preferred & if she wanted to see patient first.  I also called patient & LM for her to call us back.

## 2018-08-12 NOTE — Telephone Encounter (Signed)
Call uroGYN office   Patient is seeing  Lysbeth Penner, NP on 08/29/18.   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 Pelvic MRI could be obtained to better characterize the anatomy.  Patient has had chronic suprapubic pain.   I wanted to fax over transvaginal US which showed (please ensure nurse is aware of findings).  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 Pelvic MRI could be obtained to better characterize the anatomy.  Would maggie like me to order pelvic MRI or would she prefer to see patient first?  Let me know and ask her to call the office with any questions.   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 Please let her know that I am not sure of significance of uterus findings as I am not aware that these findings would cause pain.  An MRI pelvic may show more information. We are waiting to hear back from [facility] if we need to order if Seward Grater, NP would like to see her first.

## 2018-08-17 NOTE — Telephone Encounter (Signed)
I spoke with patient & reviewed result findings. I informed her that we had contacted Lysbeth Penner, NP office to fax her the results. We have not heard back from her office.

## 2018-08-22 NOTE — Telephone Encounter (Signed)
Circle back with patient Has she seen UroGYN yet?  Please call maggie wilkins office again and confirm she rec'ed ultrasound

## 2018-08-23 ENCOUNTER — Telehealth: Payer: Self-pay

## 2018-08-23 DIAGNOSIS — R102 Pelvic and perineal pain: Secondary | ICD-10-CM

## 2018-08-23 NOTE — Telephone Encounter (Signed)
Copied from [place] [id number]. Topic: General - Other >> Aug 23, 2018  2:34 PM Yvette Rack wrote: Reason for CRM: Nurse Practitioner Louellen Molder called in and stated yes she would like a pelvic MRI done on the patient.

## 2018-08-24 NOTE — Addendum Note (Signed)
Addended by: Burnard Hawthorne on: 08/24/2018 09:12 AM   Modules accepted: Orders

## 2018-08-24 NOTE — Telephone Encounter (Signed)
noted 

## 2018-08-24 NOTE — Telephone Encounter (Signed)
Melissa Walton,   Call pt.   Louellen Molder, NP from [facility] wanted pelvic MRI ordered which I have done.   Please ENSURE NO METAL IN HER BODY.   PLEASE also ensure patient has no concerns for pregnancy. She told me in May she she was not sexually active. Please ensure she doesn't need hcg test.   Melissa,  When can we get MRI scheduled? Any way prior to 08/28/18 before she seen UROGYN ?

## 2018-08-30 ENCOUNTER — Telehealth: Payer: Self-pay

## 2018-08-30 NOTE — Telephone Encounter (Signed)
Copied from [place] [id number]. Topic: General - Other >> Aug 30, 2018  9:05 AM Rayann Heman wrote: Reason for CRM: debbie calling with [facility] hospital called and stated that she needs a call back regarding authorization for an MRI. Please addvise. Pt is scheduled for 08/31/18.

## 2018-09-19 ENCOUNTER — Telehealth: Payer: Self-pay | Admitting: Family

## 2018-09-19 DIAGNOSIS — R102 Pelvic and perineal pain: Secondary | ICD-10-CM

## 2018-09-19 NOTE — Telephone Encounter (Signed)
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  No enlarged lymph nodes.  Report advised evaluation of upper urinary tract given the association with potential concurrent renal anomalies  I know patient has a lot going on however this report makes me think we should consider nephrology referral.  If she agreeable to this?.  We can also fax over this report to urogynecology, Louellen Molder at [facility] and patient can discuss MRI with her at followup?  Melissa Walton's phone is [phone]. Please call to get fax number and fax to her.   Please let me know patients thoughts on all

## 2018-09-21 ENCOUNTER — Other Ambulatory Visit: Payer: Self-pay

## 2018-09-21 ENCOUNTER — Ambulatory Visit: Payer: Managed Care, Other (non HMO) | Attending: Adult Health | Admitting: Physical Therapy

## 2018-09-21 ENCOUNTER — Encounter: Payer: Self-pay | Admitting: Physical Therapy

## 2018-09-21 DIAGNOSIS — R29898 Other symptoms and signs involving the musculoskeletal system: Secondary | ICD-10-CM | POA: Diagnosis present

## 2018-09-21 DIAGNOSIS — M533 Sacrococcygeal disorders, not elsewhere classified: Secondary | ICD-10-CM | POA: Diagnosis present

## 2018-09-21 DIAGNOSIS — M542 Cervicalgia: Secondary | ICD-10-CM | POA: Diagnosis present

## 2018-09-21 DIAGNOSIS — G8929 Other chronic pain: Secondary | ICD-10-CM | POA: Diagnosis present

## 2018-09-21 DIAGNOSIS — M545 Low back pain, unspecified: Secondary | ICD-10-CM

## 2018-09-21 DIAGNOSIS — M62838 Other muscle spasm: Secondary | ICD-10-CM | POA: Diagnosis present

## 2018-09-21 NOTE — Patient Instructions (Signed)
Morning and evening:   Deep core level  1 ( handout)  10 reps     Frog stretch: laying on belly with pillow under hips, knees bent, inhale do nothing, exhale let ankles fall apart     20 reps      childs poses rocking with pillow under belly if need more support/ relaxation childs poses rocking  Toes tucked, shoulders down and back, on forearms , hands shoulder width apart 10 reps   ___ Functional:   Sitting with feet on foot hip width apart   Ergonomic work station handout   Apply breathing coordination with urination and bowel movements and to minimize frequency   Place stool under feet at the toilet , feet flat
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
## 2018-09-23 NOTE — Telephone Encounter (Signed)
Yes, image is in [place] at [facility].  Call maggie wilkins at [facility]  Please advise her that MRI pelvis results from 08/31/18 are there. It is advising upper urinary tract evaluation for potentioal concurrent renal anomalies.   Does patient need referral to nephrology OR does maggie evaluate for concurrent renal anomalies?  Call pt  Inform her that we are calling Louellen Molder at [facility] to inform her of recommendation.   I have placed nephrology referral.

## 2018-09-23 NOTE — Telephone Encounter (Signed)
I called Melissa Walton office & was transferred to nurses VM. LMTCB.

## 2018-09-28 ENCOUNTER — Other Ambulatory Visit: Payer: Self-pay

## 2018-09-28 ENCOUNTER — Ambulatory Visit: Payer: Managed Care, Other (non HMO) | Admitting: Physical Therapy

## 2018-09-28 DIAGNOSIS — G8929 Other chronic pain: Secondary | ICD-10-CM

## 2018-09-28 DIAGNOSIS — M545 Low back pain, unspecified: Secondary | ICD-10-CM

## 2018-09-28 DIAGNOSIS — R29898 Other symptoms and signs involving the musculoskeletal system: Secondary | ICD-10-CM

## 2018-09-28 DIAGNOSIS — M542 Cervicalgia: Secondary | ICD-10-CM

## 2018-09-28 DIAGNOSIS — M533 Sacrococcygeal disorders, not elsewhere classified: Secondary | ICD-10-CM | POA: Diagnosis not present

## 2018-09-28 DIAGNOSIS — M62838 Other muscle spasm: Secondary | ICD-10-CM

## 2018-09-28 NOTE — Therapy (Signed)
[place] [facility] [address] [place], [place], [address] Phone: [phone]   Fax:  [phone]  Physical Therapy Treatment  Patient Details  Name: Melissa RoverHayley L Dowland MRN: [id number] Date of Birth: 31-Dec-1990 Referring Provider (PT): Lysbeth PennerMaggie Wilkins   Encounter Date: 09/28/2018  PT End of Session - 09/28/18 0852    Visit Number  2    Number of Visits  10    Date for PT Re-Evaluation  11/30/18    PT Start Time  0801    PT Stop Time  0903    PT Time Calculation (min)  62 min    Activity Tolerance  Patient tolerated treatment well;No increased pain    Behavior During Therapy  WFL for tasks assessed/performed       Past Medical History:  Diagnosis Date  . Concussion    high school    No past surgical history on file.  There were no vitals filed for this visit.  Subjective Assessment - 09/28/18 0805    Subjective  Pt had no issue after last session.    Pertinent History  Sleeps on belly and back.  Painful periods as a teenager and had to miss school. Denied surgeries, injuries. For one year in 2013, pt ran 1 miles every other day and go to the gym and performed sit up and crunches.  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 Habit with going to the toilet before leaving. Sometimes looking for public toilets upon arriving in public places "Makes you not want to go anywhere"    Patient Stated Goals  To not urinate as much.         [facility] Assessment - 09/28/18 0844      Coordination   Gross Motor Movements are Fluid and Coordinated  --   minor cues for deep core coordination     Palpation   Spinal mobility  L T12 tender, deviated to L, paraspinal mm tensions L ( post Tx: less tensions, no tenderness, and more mobility)    SI assessment   hypomobile L base of sacrum, tenderness/ tightness at glut med L ( improved post Tx)     Palpation comment  tenderness at  medial L knee ( plan to treat next session) Noted genu valgus in gait       Ambulation/Gait   Gait Comments  heel striking, short stride length, minimal hip flexion, genu valgus, medial collapse of talocrural joint B                    [facility] Adult PT Treatment/Exercise - 09/28/18 0912      Therapeutic Activites    Therapeutic Activities  --   gait training 30 ft with tactile cue      Neuro Re-ed    Neuro Re-ed Details   cued for deep core coordination and gait mechanics       Modalities   Modalities  Moist Heat      Moist Heat Therapy   Number Minutes Moist Heat  6 Minutes    Moist Heat Location  Other (comment)   thoracic during deep core exercises     Manual Therapy   Manual therapy comments  PA mob at L SIJ with MWM/STM, STM at L glut med,  PAmob at thoracic segments with MWM  PT Long Term Goals - 09/21/18 0944      PT LONG TERM GOAL #1   Title  Pt will decrease NDI score from 28% to < 14% in order to restore neck function    Time  8    Period  Weeks    Status  New    Target Date  11/16/18      PT LONG TERM GOAL #2   Title  "Pt will decrease  scores on NIH-CPSI from  60 % to < 30   % in order to restore pelvic floor functions    Time  10    Period  Weeks    Status  New    Target Date  11/30/18      PT LONG TERM GOAL #3   Title  Pt will decrease PFIQ-7 score from 24%  < 12in order to restore pelvic floor function and improve QOL    Time  8    Period  Weeks    Status  New    Target Date  11/16/18      PT LONG TERM GOAL #4   Title  Pt will demo increased SIJ mobility across 2 weeks, increased pelvic floor mobility to prevent delay with urination and improve bowel consistency    Time  2    Period  Weeks    Status  New    Target Date  10/05/18      PT LONG TERM GOAL #5   Title  Pt will increase hip strength ( hip abd from L 3/5, R 4-/5 to B 5/5) and less genu valgus in gait to minimize relapse in pelvic floor  overactivity for optimal pelvic function    Time  6    Period  Weeks    Status  New    Target Date  11/02/18      Additional Long Term Goals   Additional Long Term Goals  --      PT LONG TERM GOAL #6   Title  Pt will demo proper deep core coordination with proper diaphragmatic excursion in order to optimize postural stability and pelvic floor function and decrease LBP    Time  4    Period  Weeks    Status  New    Target Date  10/19/18      PT LONG TERM GOAL #7   Title  frequency improve from 10 min to 1 x within one hour  to once every 2 hours in order to participate in community events    Time  6    Period  Weeks    Status  New    Target Date  11/02/18            Plan - 09/28/18 0916    Clinical Impression Statement  Pt demo'd increased L SIJ mobility, increased nutation of sacrum, increased stride length and more co-activation of lower kinetic chain with transverse arch in gait. Pt required additional manual Tx to decrease thoracic hypomobility which facilitated optimal diaphragmatic/pelvic floor excursion. Pt progressed to deep core coordination/ strengthening with minor cues. Educated pt on urge suppression technique to minimize urinary frequency but withheld pelvic floor contractions and applied lengthening pelvic floor with breathing. Plan to address genu valgus/ medial collapse of ankles B at upcoming session. Pt will benefit from the use of regional interdependent approach to optimize minimized risk for relapse of overactivity of pelvic floor and hypomobility of SIJ area. Pt continues to benefit from skilled PT.  Personal Factors and Comorbidities  Fitness;Age;Past/Current Experience    Examination-Activity Limitations  Toileting;Sleep    Examination-Participation Restrictions  Shop;Community Activity    Stability/Clinical Decision Making  Evolving/Moderate complexity    Rehab Potential  Good    PT Frequency  1x / week    PT Duration  --   10   PT  Treatment/Interventions  Moist Heat;Neuromuscular re-education;Therapeutic activities;Therapeutic exercise;Manual techniques;Patient/family education;Scar mobilization;Energy conservation;Taping    Consulted and Agree with Plan of Care  Patient       Patient will benefit from skilled therapeutic intervention in order to improve the following deficits and impairments:  Abnormal gait, Decreased coordination, Decreased range of motion, Decreased endurance, Decreased balance, Decreased mobility, Postural dysfunction, Hypomobility, Improper body mechanics, Pain, Increased muscle spasms, Decreased activity tolerance  Visit Diagnosis: 1. Neck pain   2. Other muscle spasm   3. Other symptoms and signs involving the musculoskeletal system   4. Sacrococcygeal disorders, not elsewhere classified   5. Chronic bilateral low back pain without sciatica        Problem List Patient Active Problem List   Diagnosis Date Noted  . Suprapubic pressure 07/25/2018  . Lactose intolerance 01/05/2017  . Acute recurrent maxillary sinusitis 02/12/2015  . Easy bruising 01/23/2015  . Overweight (BMI 25.0-29.9) 10/18/2014  . Routine general medical examination at a health care facility 10/18/2014  . GERD (gastroesophageal reflux disease) 01/28/2011    Mariane MastersYeung,Shin Yiing ,PT, DPT, E-RYT 09/28/2018, 9:18 AM  [place] [facility] [address] [place], [place], [address] Phone: [phone]   Fax:  [phone]  Name: Melissa RoverHayley L Dolman MRN: [id number] Date of Birth: 12-Jun-1990

## 2018-09-28 NOTE — Patient Instructions (Addendum)
Pelvic tilt in sidelying 10 reps   Open book ( handout) 15 reps each   ___  Deep core level 1 and 2 ( handout)   ___  Urge Suppression  - 5 deep core breaths - do distraction activty:  Prayer   ___  Bra Extensor to wear your bra looser and less restricting to optimize diaphragm /pelvic floor  ___  Walking with  Shoulders, navel slightly more forward Higher thighs like you are riding a bicycle Meet the earth with more midfoot/ballmounds ( transverse acrch), less with heel first Push off the ground with back leg like you are lifting back leg out of mud   These changes will help with lengthening pelvic floor floor, increase sacroiliac joint mobility, increase use of proper glut and feet muscles

## 2018-10-03 ENCOUNTER — Other Ambulatory Visit: Payer: Self-pay

## 2018-10-03 ENCOUNTER — Ambulatory Visit: Payer: Managed Care, Other (non HMO) | Admitting: Physical Therapy

## 2018-10-03 DIAGNOSIS — M533 Sacrococcygeal disorders, not elsewhere classified: Secondary | ICD-10-CM | POA: Diagnosis not present

## 2018-10-03 DIAGNOSIS — R29898 Other symptoms and signs involving the musculoskeletal system: Secondary | ICD-10-CM

## 2018-10-03 DIAGNOSIS — G8929 Other chronic pain: Secondary | ICD-10-CM

## 2018-10-03 DIAGNOSIS — M542 Cervicalgia: Secondary | ICD-10-CM

## 2018-10-03 DIAGNOSIS — M62838 Other muscle spasm: Secondary | ICD-10-CM

## 2018-10-03 NOTE — Therapy (Addendum)
[place] MAIN [facility] SERVICES [address] [place], [place], [address] Phone: [phone]   Fax:  [phone]  Physical Therapy Treatment  Patient Details  Name: Melissa Walton MRN: [id number] Date of Birth: [birth year] Referring Provider (PT): Louellen Molder   Encounter Date: 10/03/2018  PT End of Session - 10/03/18 1711    Visit Number  3    Number of Visits  10    Date for PT Re-Evaluation  11/30/18    PT Start Time  3220    PT Stop Time  2542    PT Time Calculation (min)  48 min    Activity Tolerance  Patient tolerated treatment well;No increased pain    Behavior During Therapy  WFL for tasks assessed/performed       Past Medical History:  Diagnosis Date  . Concussion    high school    No past surgical history on file.  There were no vitals filed for this visit.  Subjective Assessment - 10/03/18 1710    Subjective  Pt reported her back pain is 25% improveed and buttock pain decreased by 50% and radiating pain occured only for 2 days compared to 5 days out of the week    Pertinent History  Sleeps on belly and back.  Painful periods as a teenager and had to miss school. Denied surgeries, injuries. For one year in 2013, pt ran 1 miles every other day and go to the gym and performed sit up and crunches.  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 Habit with going to the toilet before leaving. Sometimes looking for public toilets upon arriving in public places "Makes you not want to go anywhere"    Patient Stated Goals  To not urinate as much.         [facility] PT Assessment - 10/03/18 1739      Posture/Postural Control   Posture Comments  moderate cues for more stability in deep core 2       AROM   Overall AROM Comments  hip:  FADDIR on L with tenderness at R inguinal, ( post Tx: no tenderness)       Palpation   SI assessment   tenderness at L base of sacrum, slightly  hypomobile  pre Tx, no tendernss , increased mobility post Tx                Pelvic Floor Special Questions - 10/03/18 1742    External Perineal Exam  through clothing: R > L tightness/ tenderness at ant/post pelvic floor, obt int  ( post Tx: decreased)         [facility] Adult PT Treatment/Exercise - 10/03/18 1739      Therapeutic Activites    Therapeutic Activities Sleeping modifications , log rolling      Neuro Re-ed    Neuro Re-ed Details   cued for deep core level 2, pelvic floor stretches       Modalities   Modalities  Moist Heat      Moist Heat Therapy   Moist Heat Location  Other (comment)    deep core exercises     Manual Therapy   Manual therapy comments  STM/MWM at pelvic floor B , obt int                   PT Long Term Goals - 09/21/18 0944      PT  LONG TERM GOAL #1   Title  Pt will decrease NDI score from 28% to < 14% in order to restore neck function    Time  8    Period  Weeks    Status  New    Target Date  11/16/18      PT LONG TERM GOAL #2   Title  "Pt will decrease  scores on NIH-CPSI from  60 % to < 30   % in order to restore pelvic floor functions    Time  10    Period  Weeks    Status  New    Target Date  11/30/18      PT LONG TERM GOAL #3   Title  Pt will decrease PFIQ-7 score from 24%  < 12in order to restore pelvic floor function and improve QOL    Time  8    Period  Weeks    Status  New    Target Date  11/16/18      PT LONG TERM GOAL #4   Title  Pt will demo increased SIJ mobility across 2 weeks, increased pelvic floor mobility to prevent delay with urination and improve bowel consistency    Time  2    Period  Weeks    Status  New    Target Date  10/05/18      PT LONG TERM GOAL #5   Title  Pt will increase hip strength ( hip abd from L 3/5, R 4-/5 to B 5/5) and less genu valgus in gait to minimize relapse in pelvic floor overactivity for optimal pelvic function    Time  6    Period  Weeks    Status  New    Target  Date  11/02/18      Additional Long Term Goals   Additional Long Term Goals  --      PT LONG TERM GOAL #6   Title  Pt will demo proper deep core coordination with proper diaphragmatic excursion in order to optimize postural stability and pelvic floor function and decrease LBP    Time  4    Period  Weeks    Status  New    Target Date  10/19/18      PT LONG TERM GOAL #7   Title  frequency improve from 10 min to 1 x within one hour  to once every 2 hours in order to participate in community events    Time  6    Period  Weeks    Status  New    Target Date  11/02/18            Plan - 10/03/18 1753    Clinical Impression Statement  Pt showed significantly increased sacral mobility, less tenderness compared to last session. Progressed to decreasing pelvic floor tightness which helped to decrease tenderness with deep hip flexion/ adduction/ IR on L. Progressed pt to deep core strengthening with moderate cues. Anticipate improved pelvic mobility and strengtehning will help her achieve her goals and minimize her Sx. Addressed changing sleeping position to sleeping on her back instead of sleeping in her abdomen which will help minimize her neck, back pain. Pt continues to benefit from skilled PT.    Personal Factors and Comorbidities  Fitness;Age;Past/Current Experience    Examination-Activity Limitations  Toileting;Sleep    Examination-Participation Restrictions  Shop;Community Activity    Stability/Clinical Decision Making  Evolving/Moderate complexity    Rehab Potential  Good    PT  Frequency  1x / week    PT Duration  --   10   PT Treatment/Interventions  Moist Heat;Neuromuscular re-education;Therapeutic activities;Therapeutic exercise;Manual techniques;Patient/family education;Scar mobilization;Energy conservation;Taping    Consulted and Agree with Plan of Care  Patient       Patient will benefit from skilled therapeutic intervention in order to improve the following deficits and  impairments:  Abnormal gait, Decreased coordination, Decreased range of motion, Decreased endurance, Decreased balance, Decreased mobility, Postural dysfunction, Hypomobility, Improper body mechanics, Pain, Increased muscle spasms, Decreased activity tolerance  Visit Diagnosis: 1. Neck pain   2. Other muscle spasm   3. Other symptoms and signs involving the musculoskeletal system   4. Sacrococcygeal disorders, not elsewhere classified   5. Chronic bilateral low back pain without sciatica        Problem List Patient Active Problem List   Diagnosis Date Noted  . Suprapubic pressure 07/25/2018  . Lactose intolerance 01/05/2017  . Acute recurrent maxillary sinusitis 02/12/2015  . Easy bruising 01/23/2015  . Overweight (BMI 25.0-29.9) 10/18/2014  . Routine general medical examination at a health care facility 10/18/2014  . GERD (gastroesophageal reflux disease) 01/28/2011    Mariane MastersYeung,Shin Yiing ,PT, DPT, E-RYT t 10/03/2018, 5:54 PM  [facility] [facility] [address] [place], [place], [address] Phone: [phone]   Fax:  [phone]  Name: Melissa Walton MRN: [id number] Date of Birth: [birth year]

## 2018-10-03 NOTE — Patient Instructions (Addendum)
Stretch for pelvic floor   "v heels slide away and then back toward buttocks and then rock knee to slight ,  slide heel along at 11 o clock away from buttocks  10 reps   Standing :  figure -4 and tap back behind you   standing knee slightly bent   _____    Deep core level 1 and 2   ( handout)    ___  Strategies for back sleeping with pillow under knees and under arms  If on side sleeping , keep pillow under knees

## 2018-10-05 ENCOUNTER — Encounter: Payer: Managed Care, Other (non HMO) | Admitting: Physical Therapy

## 2018-10-11 ENCOUNTER — Ambulatory Visit: Payer: Managed Care, Other (non HMO) | Admitting: Physical Therapy

## 2018-10-11 ENCOUNTER — Other Ambulatory Visit: Payer: Self-pay

## 2018-10-11 DIAGNOSIS — M533 Sacrococcygeal disorders, not elsewhere classified: Secondary | ICD-10-CM

## 2018-10-11 DIAGNOSIS — M545 Low back pain, unspecified: Secondary | ICD-10-CM

## 2018-10-11 DIAGNOSIS — M62838 Other muscle spasm: Secondary | ICD-10-CM

## 2018-10-11 DIAGNOSIS — M542 Cervicalgia: Secondary | ICD-10-CM

## 2018-10-11 DIAGNOSIS — R29898 Other symptoms and signs involving the musculoskeletal system: Secondary | ICD-10-CM

## 2018-10-11 DIAGNOSIS — G8929 Other chronic pain: Secondary | ICD-10-CM

## 2018-10-11 NOTE — Patient Instructions (Signed)
More pelvic floor stretches:  1) Both ankles to the R of R hip, L hand is on the floor by L side, rock 11 to 5 o clock from front L knee to back R buttocks  5 -10 reps x  2) happy baby with strap of beach towel   3) alternating one knee bend, other straight as you rock ( like in happy baby ) no strap   ___

## 2018-10-11 NOTE — Therapy (Signed)
[facility] [address] [place], [place], [address] Phone: [phone]   Fax:  [phone]  Physical Therapy Treatment  Patient Details  Name: Melissa Walton MRN: [id number] Date of Birth: [birth year] Referring Provider (PT): Lysbeth PennerMaggie Wilkins   Encounter Date: 10/11/2018  PT End of Session - 10/11/18 1808    Visit Number  4    Number of Visits  10    Date for PT Re-Evaluation  11/30/18    PT Start Time  1700    PT Stop Time  1758    PT Time Calculation (min)  58 min    Activity Tolerance  Patient tolerated treatment well;No increased pain    Behavior During Therapy  WFL for tasks assessed/performed       Past Medical History:  Diagnosis Date  . Concussion    high school    No past surgical history on file.  There were no vitals filed for this visit.  Subjective Assessment - 10/11/18 1701    Subjective  Pt reports sleeping on her back is 40% better, frequency 40%. Start peeing is happening when she sits down and no longer has to wait. Pt's L hip pain was gone until toda but it was not as bad and not as long.    Pertinent History  Sleeps on belly and back.  Painful periods as a teenager and had to miss school. Denied surgeries, injuries. For one year in 2013, pt ran 1 miles every other day and go to the gym and performed sit up and crunches.  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 Habit with going to the toilet before leaving. Sometimes looking for public toilets upon arriving in public places "Makes you not want to go anywhere"    Patient Stated Goals  To not urinate as much.         [facility] - 10/11/18 1705      Strength   Overall Strength Comments  seated hip flex 5/5 with minor trunk pertubations, knee flex B 4+/5, knee ext 4+/5 B, hip abd L 5/5, R 4+/5, B hip ext 5/5         Palpation   SI assessment   increased adductor tightness on  R, tenderness at medial attachments B                 Pelvic Floor Special Questions - 10/11/18 1747    External Perineal Exam  through clothing: R , obt int, bulbospongiosus, ischiocavernosus, deep transverse perineal ( decreased tightness, tenderness post Tx)        [facility] Adult PT Treatment/Exercise - 10/11/18 1746      Exercises   Exercises  --   cued for proper technique on new HEP      Modalities   Modalities  Moist Heat      Moist Heat Therapy   Number Minutes Moist Heat  6 Minutes    Moist Heat Location  Other (comment)   perineal      Manual Therapy   Manual therapy comments  STM/MWM at pelvic floor R , obt int, bulbospongiosus, ischiocavernosus, deep transverse perineal , R adductor, B medial knee atachments                     PT Long Term Goals - 09/21/18 0944      PT LONG TERM GOAL #1  Title  Pt will decrease NDI score from 28% to < 14% in order to restore neck function    Time  8    Period  Weeks    Status  New    Target Date  11/16/18      PT LONG TERM GOAL #2   Title  "Pt will decrease  scores on NIH-CPSI from  60 % to < 30   % in order to restore pelvic floor functions    Time  10    Period  Weeks    Status  New    Target Date  11/30/18      PT LONG TERM GOAL #3   Title  Pt will decrease PFIQ-7 score from 24%  < 12in order to restore pelvic floor function and improve QOL    Time  8    Period  Weeks    Status  New    Target Date  11/16/18      PT LONG TERM GOAL #4   Title  Pt will demo increased SIJ mobility across 2 weeks, increased pelvic floor mobility to prevent delay with urination and improve bowel consistency    Time  2    Period  Weeks    Status  New    Target Date  10/05/18      PT LONG TERM GOAL #5   Title  Pt will increase hip strength ( hip abd from L 3/5, R 4-/5 to B 5/5) and less genu valgus in gait to minimize relapse in pelvic floor overactivity for optimal pelvic function    Time  6    Period  Weeks     Status  New    Target Date  11/02/18      Additional Long Term Goals   Additional Long Term Goals  --      PT LONG TERM GOAL #6   Title  Pt will demo proper deep core coordination with proper diaphragmatic excursion in order to optimize postural stability and pelvic floor function and decrease LBP    Time  4    Period  Weeks    Status  New    Target Date  10/19/18      PT LONG TERM GOAL #7   Title  frequency improve from 10 min to 1 x within one hour  to once every 2 hours in order to participate in community events    Time  6    Period  Weeks    Status  New    Target Date  11/02/18            Plan - 10/11/18 1803    Clinical Impression Statement  Pt is progressing well with improved ability to eliminate urine without delay this week. Urgency is also improving. Today, pt demo'd decreased pelvic floor tightness on R which will continue to help pt progress towards pelvic floor lengthening to achieve her goals with inserting tampon without pain. Addressed tight adductors today to minimize genu valgus. BLE strength has improved since last session. Plan to use regional interdependent approach to promote more co-activation of lower k inetic chain and minimize overactivity of pelvic floor. Pt required excessive cues for feet propioception to relax pelvic floor today. Pt continues to benefit from skilled PT.    Personal Factors and Comorbidities  Fitness;Age;Past/Current Experience    Examination-Activity Limitations  Toileting;Sleep    Examination-Participation Restrictions  Shop;Community Activity    Stability/Clinical Decision Making  Evolving/Moderate complexity  Rehab Potential  Good    PT Frequency  1x / week    PT Duration  --   10   PT Treatment/Interventions  Moist Heat;Neuromuscular re-education;Therapeutic activities;Therapeutic exercise;Manual techniques;Patient/family education;Scar mobilization;Energy conservation;Taping    Consulted and Agree with Plan of Care  Patient        Patient will benefit from skilled therapeutic intervention in order to improve the following deficits and impairments:  Abnormal gait, Decreased coordination, Decreased range of motion, Decreased endurance, Decreased balance, Decreased mobility, Postural dysfunction, Hypomobility, Improper body mechanics, Pain, Increased muscle spasms, Decreased activity tolerance  Visit Diagnosis: 1. Neck pain   2. Sacrococcygeal disorders, not elsewhere classified   3. Other muscle spasm   4. Other symptoms and signs involving the musculoskeletal system   5. Chronic bilateral low back pain without sciatica        Problem List Patient Active Problem List   Diagnosis Date Noted  . Suprapubic pressure 07/25/2018  . Lactose intolerance 01/05/2017  . Acute recurrent maxillary sinusitis 02/12/2015  . Easy bruising 01/23/2015  . Overweight (BMI 25.0-29.9) 10/18/2014  . Routine general medical examination at a health care facility 10/18/2014  . GERD (gastroesophageal reflux disease) 01/28/2011    Mariane MastersYeung,Shin Yiing ,PT, DPT, E-RYT  10/11/2018, 6:09 PM  [place] [facility] [address] [place], [place], [address] Phone: [phone]   Fax:  [phone]  Name: Melissa Walton MRN: [id number] Date of Birth: 19-Sep-1990

## 2018-10-12 ENCOUNTER — Encounter: Payer: Managed Care, Other (non HMO) | Admitting: Physical Therapy

## 2018-10-18 ENCOUNTER — Ambulatory Visit: Payer: Managed Care, Other (non HMO) | Attending: Adult Health | Admitting: Physical Therapy

## 2018-10-18 ENCOUNTER — Other Ambulatory Visit: Payer: Self-pay

## 2018-10-18 DIAGNOSIS — M533 Sacrococcygeal disorders, not elsewhere classified: Secondary | ICD-10-CM | POA: Insufficient documentation

## 2018-10-18 DIAGNOSIS — G8929 Other chronic pain: Secondary | ICD-10-CM | POA: Diagnosis present

## 2018-10-18 DIAGNOSIS — M545 Low back pain: Secondary | ICD-10-CM | POA: Diagnosis present

## 2018-10-18 DIAGNOSIS — M62838 Other muscle spasm: Secondary | ICD-10-CM | POA: Diagnosis present

## 2018-10-18 DIAGNOSIS — M542 Cervicalgia: Secondary | ICD-10-CM | POA: Insufficient documentation

## 2018-10-18 DIAGNOSIS — R29898 Other symptoms and signs involving the musculoskeletal system: Secondary | ICD-10-CM | POA: Diagnosis present

## 2018-10-19 ENCOUNTER — Encounter: Payer: Managed Care, Other (non HMO) | Admitting: Physical Therapy

## 2018-10-19 NOTE — Therapy (Signed)
[place] MAIN [facility] SERVICES [address] [place], [place], [address] Phone: [phone]   Fax:  [phone]  Physical Therapy Treatment  Patient Details  Name: TANECIA MCCAY MRN: [id number] Date of Birth: [birth year] Referring Provider (PT): Louellen Molder   Encounter Date: 10/18/2018  PT End of Session - 10/18/18 1719    Visit Number  5    Number of Visits  10    Date for PT Re-Evaluation  11/30/18    PT Start Time  0600    PT Stop Time  1805    PT Time Calculation (min)  55 min    Activity Tolerance  Patient tolerated treatment well;No increased pain    Behavior During Therapy  WFL for tasks assessed/performed       Past Medical History:  Diagnosis Date  . Concussion    high school    No past surgical history on file.  There were no vitals filed for this visit.  Subjective Assessment - 10/19/18 1613    Subjective  Pt is able to pee without delay. Tampon stayed in better with her menstrual cycle compared to before but painful still at 50%.    Pertinent History  Sleeps on belly and back.  Painful periods as a teenager and had to miss school. Denied surgeries, injuries. For one year in 2013, pt ran 1 miles every other day and go to the gym and performed sit up and crunches.  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 Habit with going to the toilet before leaving. Sometimes looking for public toilets upon arriving in public places "Makes you not want to go anywhere"    Patient Stated Goals  To not urinate as much.                    Pelvic Floor Special Questions - 10/19/18 1613    External Perineal Exam  through clothing: B obt int, deep transverse perineal , tightness over pubic sym B ( decreased post Tx)          [facility] Adult PT Treatment/Exercise - 10/19/18 1615      Therapeutic Activites    Therapeutic Activities  --   stretches to perform at work to  minimzie tightness of pelvic     Neuro Re-ed    Neuro Re-ed Details   relaxation training, mindfulness to relax pelvic floor and nervous system       Manual Therapy   Manual therapy comments  STM/ MWM at problem areas noted in assessment                   PT Long Term Goals - 10/18/18 1716      PT LONG TERM GOAL #1   Title  Pt will decrease NDI score from 28% to < 14% in order to restore neck function    Time  8    Period  Weeks    Status  On-going      PT LONG TERM GOAL #2   Title  "Pt will decrease  scores on NIH-CPSI from  60 % to < 30   % in order to restore pelvic floor functions    Time  10    Period  Weeks    Status  On-going      PT LONG TERM GOAL #3   Title  Pt will decrease PFIQ-7 score from 24%  <  12in order to restore pelvic floor function and improve QOL    Time  8    Period  Weeks    Status  On-going      PT LONG TERM GOAL #4   Title  Pt will demo increased SIJ mobility across 2 weeks, increased pelvic floor mobility to prevent delay with urination and improve bowel consistency    Time  2    Period  Weeks    Status  Achieved      PT LONG TERM GOAL #5   Title  Pt will increase hip strength ( hip abd from L 3/5, R 4-/5 to B 5/5) and less genu valgus in gait to minimize relapse in pelvic floor overactivity for optimal pelvic function    Time  6    Period  Weeks    Status  On-going      Additional Long Term Goals   Additional Long Term Goals  Yes      PT LONG TERM GOAL #6   Title  Pt will demo proper deep core coordination with proper diaphragmatic excursion in order to optimize postural stability and pelvic floor function and decrease LBP    Time  4    Period  Weeks    Status  On-going      PT LONG TERM GOAL #7   Title  frequency improve from 10 min to 1 x within one hour  to once every 2 hours in order to participate in community events    Time  6    Period  Weeks    Status  Partially Met      PT LONG TERM GOAL #8   Title  Pt will be  able to wear a tampon without pain in order to achieve ADLs    Time  4    Period  Weeks    Status  New    Target Date  11/15/18            Plan - 10/19/18 1617    Clinical Impression Statement  Pt made progress with ability to insert/ wear tampon with less pain w/ recent menstrual cycle. Today, Pt demo'd decreased tightness of deep  mm of anterior pelvic floor today. Provided stretches to perform at work to  minimize overactivity of pelvic floor from sitting  long hours. Provided guided relaxation to assist pt with ability to relax pelvic floor. Pt reported feeling more relaxed post Tx. Pt continues to benefit from skilled PT.    Personal Factors and Comorbidities  Fitness;Age;Past/Current Experience    Examination-Activity Limitations  Toileting;Sleep    Examination-Participation Restrictions  Shop;Community Activity    Stability/Clinical Decision Making  Evolving/Moderate complexity    Rehab Potential  Good    PT Frequency  1x / week    PT Duration  --   10   PT Treatment/Interventions  Moist Heat;Neuromuscular re-education;Therapeutic activities;Therapeutic exercise;Manual techniques;Patient/family education;Scar mobilization;Energy conservation;Taping    Consulted and Agree with Plan of Care  Patient       Patient will benefit from skilled therapeutic intervention in order to improve the following deficits and impairments:  Abnormal gait, Decreased coordination, Decreased range of motion, Decreased endurance, Decreased balance, Decreased mobility, Postural dysfunction, Hypomobility, Improper body mechanics, Pain, Increased muscle spasms, Decreased activity tolerance  Visit Diagnosis: 1. Sacrococcygeal disorders, not elsewhere classified   2. Neck pain   3. Other muscle spasm   4. Other symptoms and signs involving the musculoskeletal system   5. Chronic bilateral  low back pain without sciatica        Problem List Patient Active Problem List   Diagnosis Date Noted  .  Suprapubic pressure 07/25/2018  . Lactose intolerance 01/05/2017  . Acute recurrent maxillary sinusitis 02/12/2015  . Easy bruising 01/23/2015  . Overweight (BMI 25.0-29.9) 10/18/2014  . Routine general medical examination at a health care facility 10/18/2014  . GERD (gastroesophageal reflux disease) 01/28/2011    Jerl Mina ,PT, DPT, E-RYT  10/19/2018, 4:21 PM  [place] MAIN [facility] SERVICES [address] [place], [place], [address] Phone: [phone]   Fax:  [phone]  Name: LINDAANN GRADILLA MRN: [id number] Date of Birth: 1990/04/29

## 2018-10-19 NOTE — Patient Instructions (Signed)
Hip flexor stretch , one foot on chair, Hip flexor stretch with thigh at 45 deg, resting arm and "high five " sky for trunk rotation with small turn without overarching back   Perform at work to minimize tightness at anterior triangle of pelvic floor

## 2018-10-24 NOTE — Telephone Encounter (Signed)
Call pt  Has she seen nephrology yet after results from MRI Pelvis on 08/31/18?   How does she feel? Does she feel she needs f/u appt here?

## 2018-10-24 NOTE — Telephone Encounter (Signed)
LM that referral to urology was placed & to call our office in the next week if she does not hear from [place].

## 2018-10-24 NOTE — Telephone Encounter (Signed)
Call pt Referral placed to UROLOGY now.   Advised to call us if she is not heard from [place] in regards to this appt in next week

## 2018-10-25 ENCOUNTER — Other Ambulatory Visit: Payer: Self-pay

## 2018-10-25 ENCOUNTER — Ambulatory Visit: Payer: Managed Care, Other (non HMO) | Admitting: Physical Therapy

## 2018-10-25 DIAGNOSIS — G8929 Other chronic pain: Secondary | ICD-10-CM

## 2018-10-25 DIAGNOSIS — M533 Sacrococcygeal disorders, not elsewhere classified: Secondary | ICD-10-CM

## 2018-10-25 DIAGNOSIS — M542 Cervicalgia: Secondary | ICD-10-CM

## 2018-10-25 DIAGNOSIS — M62838 Other muscle spasm: Secondary | ICD-10-CM

## 2018-10-25 DIAGNOSIS — R29898 Other symptoms and signs involving the musculoskeletal system: Secondary | ICD-10-CM

## 2018-10-26 ENCOUNTER — Encounter: Payer: Managed Care, Other (non HMO) | Admitting: Physical Therapy

## 2018-10-26 NOTE — Therapy (Signed)
[place] MAIN [facility] SERVICES [address] [place], [place], [address] Phone: [phone]   Fax:  [phone]  Physical Therapy Treatment  Patient Details  Name: Melissa Walton MRN: [id number] Date of Birth: Mar 17, 1990 Referring Provider (PT): Louellen Molder   Encounter Date: 10/25/2018  PT End of Session - 10/25/18 1709    Visit Number  6    Number of Visits  10    Date for PT Re-Evaluation  11/30/18    PT Start Time  8127    PT Stop Time  5170    PT Time Calculation (min)  50 min    Activity Tolerance  Patient tolerated treatment well;No increased pain    Behavior During Therapy  WFL for tasks assessed/performed       Past Medical History:  Diagnosis Date  . Concussion    high school    No past surgical history on file.  There were no vitals filed for this visit.  Subjective Assessment - 10/25/18 1707    Subjective  Pt reports two days after last session, pt reported relapse with frequent urination and night time voiding . Pt has had diffulty sleeping the past days. Pt has not had a sleep study done before    Pertinent History  Sleeps on belly and back.  Painful periods as a teenager and had to miss school. Denied surgeries, injuries. For one year in 2013, pt ran 1 miles every other day and go to the gym and performed sit up and crunches.  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 Habit with going to the toilet before leaving. Sometimes looking for public toilets upon arriving in public places "Makes you not want to go anywhere"    Patient Stated Goals  To not urinate as much.                    Pelvic Floor Special Questions - 10/26/18 1354    External Perineal Exam  through clothing: no tensions/ tenderness  at B obt int, deep transverse perineal , tightness over pubic sym B ( decreased post Tx)          [facility] Adult PT Treatment/Exercise - 10/26/18  1351      Therapeutic Activites    Therapeutic Activities  --   explained about urinary system and nervous system      Neuro Re-ed    Neuro Re-ed Details   relaxation training, mindfulness to relax pelvic floor and nervous system                   PT Long Term Goals - 10/18/18 1716      PT LONG TERM GOAL #1   Title  Pt will decrease NDI score from 28% to < 14% in order to restore neck function    Time  8    Period  Weeks    Status  On-going      PT LONG TERM GOAL #2   Title  "Pt will decrease  scores on NIH-CPSI from  60 % to < 30   % in order to restore pelvic floor functions    Time  10    Period  Weeks    Status  On-going      PT LONG TERM GOAL #3   Title  Pt will decrease PFIQ-7 score from 24%  < 12in order to restore pelvic floor  function and improve QOL    Time  8    Period  Weeks    Status  On-going      PT LONG TERM GOAL #4   Title  Pt will demo increased SIJ mobility across 2 weeks, increased pelvic floor mobility to prevent delay with urination and improve bowel consistency    Time  2    Period  Weeks    Status  Achieved      PT LONG TERM GOAL #5   Title  Pt will increase hip strength ( hip abd from L 3/5, R 4-/5 to B 5/5) and less genu valgus in gait to minimize relapse in pelvic floor overactivity for optimal pelvic function    Time  6    Period  Weeks    Status  On-going      Additional Long Term Goals   Additional Long Term Goals  Yes      PT LONG TERM GOAL #6   Title  Pt will demo proper deep core coordination with proper diaphragmatic excursion in order to optimize postural stability and pelvic floor function and decrease LBP    Time  4    Period  Weeks    Status  On-going      PT LONG TERM GOAL #7   Title  frequency improve from 10 min to 1 x within one hour  to once every 2 hours in order to participate in community events    Time  6    Period  Weeks    Status  Partially Met      PT LONG TERM GOAL #8   Title  Pt will be able to  wear a tampon without pain in order to achieve ADLs    Time  4    Period  Weeks    Status  New    Target Date  11/15/18            Plan - 10/26/18 1355    Clinical Impression Statement  Pt has relapse of urinary Sx this past week with frequent urination and nocturia. Back and neck pain remain resolved. Pt showed no tensions at pelvic floor and therefore, relapse may be related to stress. Guided pt through relaxation practices, educated pt on principles of relaxation and discussed relationship between anxiety and urinary Sx/ pelvic floor overactivity. Following relaxation training, pt reported experiencing a soothing feeling. Pt voiced understanding and motivation to apply mindfulness and relaxation practices after work and before bedas pt reported she has anxiety. Also discussed importance of relaxation practices to improve sleep quality.  Pt continues to benefit from skilled PT to minimize relapse of Sx    Personal Factors and Comorbidities  Fitness;Age;Past/Current Experience    Examination-Activity Limitations  Toileting;Sleep    Examination-Participation Restrictions  Shop;Community Activity    Stability/Clinical Decision Making  Evolving/Moderate complexity    Rehab Potential  Good    PT Frequency  1x / week    PT Duration  --   10   PT Treatment/Interventions  Moist Heat;Neuromuscular re-education;Therapeutic activities;Therapeutic exercise;Manual techniques;Patient/family education;Scar mobilization;Energy conservation;Taping    Consulted and Agree with Plan of Care  Patient       Patient will benefit from skilled therapeutic intervention in order to improve the following deficits and impairments:  Abnormal gait, Decreased coordination, Decreased range of motion, Decreased endurance, Decreased balance, Decreased mobility, Postural dysfunction, Hypomobility, Improper body mechanics, Pain, Increased muscle spasms, Decreased activity tolerance  Visit Diagnosis: 1. Sacrococcygeal  disorders, not elsewhere classified   2. Neck pain   3. Other muscle spasm   4. Other symptoms and signs involving the musculoskeletal system   5. Chronic bilateral low back pain without sciatica        Problem List Patient Active Problem List   Diagnosis Date Noted  . Suprapubic pressure 07/25/2018  . Lactose intolerance 01/05/2017  . Acute recurrent maxillary sinusitis 02/12/2015  . Easy bruising 01/23/2015  . Overweight (BMI 25.0-29.9) 10/18/2014  . Routine general medical examination at a health care facility 10/18/2014  . GERD (gastroesophageal reflux disease) 01/28/2011    Jerl Mina ,PT, DPT, E-RYT  10/26/2018, 1:58 PM  [place] MAIN [facility] SERVICES [address] [place], [place], [address] Phone: [phone]   Fax:  [phone]  Name: BASMA BUCHNER MRN: [id number] Date of Birth: September 15, 1990

## 2018-10-26 NOTE — Patient Instructions (Signed)
Relaxation principles

## 2018-10-28 ENCOUNTER — Other Ambulatory Visit: Payer: Self-pay

## 2018-10-28 ENCOUNTER — Ambulatory Visit (INDEPENDENT_AMBULATORY_CARE_PROVIDER_SITE_OTHER): Payer: Managed Care, Other (non HMO) | Admitting: Family

## 2018-10-28 ENCOUNTER — Encounter: Payer: Self-pay | Admitting: Family

## 2018-10-28 DIAGNOSIS — R102 Pelvic and perineal pain: Secondary | ICD-10-CM | POA: Diagnosis not present

## 2018-10-28 NOTE — Assessment & Plan Note (Signed)
Improved with PT. 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  Will follow.

## 2018-10-28 NOTE — Progress Notes (Signed)
This visit type was conducted due to national recommendations for restrictions regarding the COVID-19 pandemic (e.g. social distancing).  This format is felt to be most appropriate for this patient at this time.  All issues noted in this document were discussed and addressed.  No physical exam was performed (except for noted visual exam findings with Video Visits). Virtual Visit via Video Note  I connected with@  on 10/28/18 at  4:00 PM EDT by a video enabled telemedicine application and verified that I am speaking with the correct person using two identifiers.  Location patient: home Location provider:work  Persons participating in the virtual visit: patient, provider  I discussed the limitations of evaluation and management by telemedicine and the availability of in person appointments. The patient expressed understanding and agreed to proceed.   HPI:  Feels well today.  No new complaints.   Suprapubic pressure/ Levator spasm- Ache has improved. feels better after starting PT with [facility].  No dysuria, urinary frequency.  Appointment with urology for 11/2018.   Due tdap   ROS: See pertinent positives and negatives per HPI.  Past Medical History:  Diagnosis Date  . Concussion    high school    History reviewed. No pertinent surgical history.  Family History  Problem Relation Age of Onset  . Diabetes Mother   . Hyperlipidemia Mother   . Hypertension Father   . Gout Father   . Heart disease Maternal Grandfather   . Ovarian cancer Paternal Grandmother     SOCIAL HX: never smoker   Current Outpatient Medications:  .  cetirizine (ZYRTEC) 10 MG tablet, Take 10 mg by mouth daily., Disp: , Rfl:  .  ibuprofen (ADVIL,MOTRIN) 200 MG tablet, Take by mouth., Disp: , Rfl:  .  Probiotic Product (PROBIOTIC-10 PO), Take by mouth., Disp: , Rfl:   EXAM:  VITALS per patient if applicable:  GENERAL: alert, oriented, appears well and in no acute distress  HEENT: atraumatic,  conjunttiva clear, no obvious abnormalities on inspection of external nose and ears  NECK: normal movements of the head and neck  LUNGS: on inspection no signs of respiratory distress, breathing rate appears normal, no obvious gross SOB, gasping or wheezing  CV: no obvious cyanosis  MS: moves all visible extremities without noticeable abnormality  PSYCH/NEURO: pleasant and cooperative, no obvious depression or anxiety, speech and thought processing grossly intact  ASSESSMENT AND PLAN:  Discussed the following assessment and plan:  Problem List Items Addressed This Visit      Other   Suprapubic pressure    Improved with PT. 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  Will follow.           I discussed the assessment and treatment plan with the patient. The patient was provided an opportunity to ask questions and all were answered. The patient agreed with the plan and demonstrated an understanding of the instructions.   The patient was advised to call back or seek an in-person evaluation if the symptoms worsen or if the condition fails to improve as anticipated.   Mable Paris, FNP

## 2018-10-28 NOTE — Patient Instructions (Signed)
Glad you are feeling better  Please ensure you continue follow-up with [facility] gynecology.   Please also have your tetanus vaccine (Tdap) at a local pharmacy  Stay safe!

## 2018-11-01 ENCOUNTER — Ambulatory Visit: Payer: Managed Care, Other (non HMO) | Admitting: Physical Therapy

## 2018-11-01 ENCOUNTER — Other Ambulatory Visit: Payer: Self-pay

## 2018-11-01 DIAGNOSIS — Z20822 Contact with and (suspected) exposure to covid-19: Secondary | ICD-10-CM

## 2018-11-02 ENCOUNTER — Encounter: Payer: Managed Care, Other (non HMO) | Admitting: Physical Therapy

## 2018-11-03 LAB — NOVEL CORONAVIRUS, NAA: SARS-CoV-2, NAA: NOT DETECTED

## 2018-11-08 ENCOUNTER — Ambulatory Visit: Payer: Managed Care, Other (non HMO) | Admitting: Physical Therapy

## 2018-11-08 ENCOUNTER — Other Ambulatory Visit: Payer: Self-pay

## 2018-11-08 DIAGNOSIS — G8929 Other chronic pain: Secondary | ICD-10-CM

## 2018-11-08 DIAGNOSIS — R29898 Other symptoms and signs involving the musculoskeletal system: Secondary | ICD-10-CM

## 2018-11-08 DIAGNOSIS — M542 Cervicalgia: Secondary | ICD-10-CM

## 2018-11-08 DIAGNOSIS — M533 Sacrococcygeal disorders, not elsewhere classified: Secondary | ICD-10-CM

## 2018-11-08 DIAGNOSIS — M545 Low back pain, unspecified: Secondary | ICD-10-CM

## 2018-11-08 DIAGNOSIS — M62838 Other muscle spasm: Secondary | ICD-10-CM

## 2018-11-08 NOTE — Patient Instructions (Signed)
Tricep dips   5 reps   Watch out for shoulders hunching up  ____  Melissa Walton  5 reps   Planting arms and feet down before exhale to lift pelvis Not squeezing gluts   ____  Single leg lift Other knee bent  Planting arms and feet down before exhale to lift leg NOT ARCHING BACK  5reps   ___  Complimentary stretches

## 2018-11-08 NOTE — Therapy (Signed)
[place] [facility] MAIN [facility] SERVICES [address] [place], [place], [address] Phone: [phone]   Fax:  [phone]  Physical Therapy Treatment / Progress Note  09/21/18 to 11/08/18   Patient Details  Name: Melissa Walton MRN: [id number] Date of Birth: May 01, 1990 Referring Provider (PT): Lysbeth Penner   Encounter Date: 11/08/2018  PT End of Session - 11/08/18 2351    Visit Number  8    Number of Visits  12    Date for PT Re-Evaluation  01/31/19    PT Start Time  1705    PT Stop Time  1755    PT Time Calculation (min)  50 min    Activity Tolerance  Patient tolerated treatment well;No increased pain    Behavior During Therapy  WFL for tasks assessed/performed       Past Medical History:  Diagnosis Date  . Concussion    high school    No past surgical history on file.  There were no vitals filed for this visit.  Subjective Assessment - 11/08/18 1714    Subjective  Pt has been practicing mindfulness every few days and going to her work's mindful classes.    Pertinent History  Sleeps on belly and back.  Painful periods as a teenager and had to miss school. Denied surgeries, injuries. For one year in 2013, pt ran 1 miles every other day and go to the gym and performed sit up and crunches.  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 Habit with going to the toilet before leaving. Sometimes looking for public toilets upon arriving in public places "Makes you not want to go anywhere"    Patient Stated Goals  To not urinate as much.         [facility] PT Assessment - 11/08/18 2356      Observation/Other Assessments   Observations  quick to relax mm  , decreased mm tightness of global mm                Pelvic Floor Special Questions - 11/08/18 2355    External Perineal Exam  signficantly minor tenderness with palpation at obt int L >R , no tenderness/ tightness at other areas of  pelvic floor mm         [facility] Adult PT Treatment/Exercise - 11/08/18 2349      Therapeutic Activites    Therapeutic Activities  --   reassessed goals, discussed fitness phase of POC    Other Therapeutic Activities  discussed other mindfulness resources      Neuro Re-ed    Neuro Re-ed Details   cued for alignment for triceps, single lift lift in hooklying, bridging to minimize overuse of pelvic floor mm and optimize deep core mm                   PT Long Term Goals - 11/08/18 2350      PT LONG TERM GOAL #1   Title  Pt will decrease NDI score from 28% to < 14% in order to restore neck function  ( 8/25: 17%)    Time  8    Period  Weeks    Status  Achieved      PT LONG TERM GOAL #2   Title  "Pt will decrease  scores on NIH-CPSI from  60 % to < 30   % in order to restore pelvic floor functions (8/25:  23%)    Time  10    Period  Weeks    Status  Achieved      PT LONG TERM GOAL #3   Title  Pt will decrease PFIQ-7 score from 24%  < 12in order to restore pelvic floor function and improve QOL  (8/25: 11%)    Time  8    Period  Weeks    Status  Achieved      PT LONG TERM GOAL #4   Title  Pt will demo increased SIJ mobility across 2 weeks, increased pelvic floor mobility to prevent delay with urination and improve bowel consistency    Time  2    Period  Weeks    Status  Achieved      PT LONG TERM GOAL #5   Title  Pt will increase hip strength ( hip abd from L 3/5, R 4-/5 to B 5/5) and less genu valgus in gait to minimize relapse in pelvic floor overactivity for optimal pelvic function ( 8/25: B hip abd 5/5 )    Time  6    Period  Weeks    Status  Achieved      PT LONG TERM GOAL #6   Title  Pt will demo proper deep core coordination with proper diaphragmatic excursion in order to optimize postural stability and pelvic floor function and decrease LBP    Time  4    Period  Weeks    Status  Achieved      PT LONG TERM GOAL #7   Title  frequency improve from 10 min to  1 x within one hour  to once every 2 hours in order to participate in community events    Time  6    Period  Weeks    Status  Achieved      PT LONG TERM GOAL #8   Title  Pt will be able to wear a tampon without pain in order to achieve ADLs    Time  4    Period  Weeks    Status  Achieved      PT LONG TERM GOAL  #9   TITLE  Pt will demo proper alignment and technique in fitness exercises to minimzie relapse of overactvity of pelvic floor    Time  12    Period  Weeks    Status  New    Target Date  01/31/19            Plan - 11/08/18 2352    Clinical Impression Statement  Across the past 8 visits,  pt  has achieved 8/9 goals. Pt has made the following significantly improvements:  _ urinary frequency decreased significantly from every 10 min to once every 1 hour to once every 2 hours _ pelvic floor mm tensions decreased across all three layers of muscles which has facilitated pt being able to wear a tampon without pain _ decreased global mm along spine and BLE with report of decreased LBP _improved deep core coordination and strength, less genu valgus, and improved lower kinetic chain co-activation in gait _scores in the following questionnaires of NIH-CPSI and PFIQ-7 decreased indicating improved pelvic floor function _score in Neck Disability Index decreased with improved posture, less forward head, thoracic kyphosis, less hyperextended knees.  _compliance with mindfulness/ relaxation practice to manage stress and minimize mm tensions/ overactivity of pelvic floor mm   Pt is entering the fitness phase of her POC and will benefit from skilled PT once  a month to learn safer methods to perform fitness exercises while minimize for injuries, relapse of pelvic / LBP Sx.        Personal Factors and Comorbidities  Fitness;Age;Past/Current Experience    Examination-Activity Limitations  Toileting;Sleep    Examination-Participation Restrictions  Shop;Community Activity     Stability/Clinical Decision Making  Evolving/Moderate complexity    Rehab Potential  Good    PT Frequency  1x / week    PT Duration  --   10   PT Treatment/Interventions  Moist Heat;Neuromuscular re-education;Therapeutic activities;Therapeutic exercise;Manual techniques;Patient/family education;Scar mobilization;Energy conservation;Taping    Consulted and Agree with Plan of Care  Patient       Patient will benefit from skilled therapeutic intervention in order to improve the following deficits and impairments:  Abnormal gait, Decreased coordination, Decreased range of motion, Decreased endurance, Decreased balance, Decreased mobility, Postural dysfunction, Hypomobility, Improper body mechanics, Pain, Increased muscle spasms, Decreased activity tolerance  Visit Diagnosis: Sacrococcygeal disorders, not elsewhere classified  Neck pain  Other muscle spasm  Other symptoms and signs involving the musculoskeletal system  Chronic bilateral low back pain without sciatica     Problem List Patient Active Problem List   Diagnosis Date Noted  . Suprapubic pressure 07/25/2018  . Lactose intolerance 01/05/2017  . Acute recurrent maxillary sinusitis 02/12/2015  . Easy bruising 01/23/2015  . Overweight (BMI 25.0-29.9) 10/18/2014  . Routine general medical examination at a health care facility 10/18/2014  . GERD (gastroesophageal reflux disease) 01/28/2011    Mariane MastersYeung,Shin Yiing ,PT, DPT, E-RYT  11/08/2018, 11:57 PM  [place] [facility] [address] [place], [place], [id number] Phone: [phone]   Fax:  [phone]  Name: Melissa Walton MRN: [id number] Date of Birth: 27-Oct-1990

## 2018-11-09 ENCOUNTER — Encounter: Payer: Managed Care, Other (non HMO) | Admitting: Physical Therapy

## 2018-11-15 ENCOUNTER — Ambulatory Visit: Payer: Managed Care, Other (non HMO) | Attending: Adult Health | Admitting: Physical Therapy

## 2018-11-16 ENCOUNTER — Encounter: Payer: Managed Care, Other (non HMO) | Admitting: Physical Therapy

## 2018-11-22 ENCOUNTER — Ambulatory Visit: Payer: Managed Care, Other (non HMO) | Admitting: Physical Therapy

## 2018-11-29 ENCOUNTER — Encounter: Payer: Managed Care, Other (non HMO) | Admitting: Physical Therapy

## 2018-11-29 ENCOUNTER — Telehealth: Payer: Managed Care, Other (non HMO) | Admitting: Nurse Practitioner

## 2018-11-29 DIAGNOSIS — J0100 Acute maxillary sinusitis, unspecified: Secondary | ICD-10-CM

## 2018-11-29 MED ORDER — DOXYCYCLINE HYCLATE 100 MG PO TABS: 100.0000 mg | ORAL_TABLET | Freq: Two times a day (BID) | ORAL | 0 refills | Status: DC

## 2018-11-29 NOTE — Progress Notes (Signed)

## 2018-11-30 ENCOUNTER — Ambulatory Visit: Payer: Self-pay | Admitting: Urology

## 2018-12-06 ENCOUNTER — Encounter: Payer: Managed Care, Other (non HMO) | Admitting: Physical Therapy

## 2018-12-09 ENCOUNTER — Other Ambulatory Visit: Payer: Self-pay

## 2018-12-09 ENCOUNTER — Emergency Department
Admission: EM | Admit: 2018-12-09 | Discharge: 2018-12-10 | Disposition: A | Discharge status: Home / Self Care | Payer: Managed Care, Other (non HMO) | Attending: Emergency Medicine | Admitting: Emergency Medicine

## 2018-12-09 DIAGNOSIS — K922 Gastrointestinal hemorrhage, unspecified: Secondary | ICD-10-CM

## 2018-12-09 DIAGNOSIS — Z79899 Other long term (current) drug therapy: Secondary | ICD-10-CM | POA: Insufficient documentation

## 2018-12-09 DIAGNOSIS — K921 Melena: Secondary | ICD-10-CM | POA: Diagnosis present

## 2018-12-09 LAB — CBC
HCT: 31.4 % — ABNORMAL LOW (ref 36.0–46.0)
Hemoglobin: 10.3 g/dL — ABNORMAL LOW (ref 12.0–15.0)
MCH: 31.1 pg (ref 26.0–34.0)
MCHC: 32.8 g/dL (ref 30.0–36.0)
MCV: 94.9 fL (ref 80.0–100.0)
Platelets: 318 10*3/uL (ref 150–400)
RBC: 3.31 MIL/uL — ABNORMAL LOW (ref 3.87–5.11)
RDW: 11.7 % (ref 11.5–15.5)
WBC: 9.6 10*3/uL (ref 4.0–10.5)
nRBC: 0 % (ref 0.0–0.2)

## 2018-12-09 LAB — COMPREHENSIVE METABOLIC PANEL
ALT: 16 U/L (ref 0–44)
AST: 16 U/L (ref 15–41)
Albumin: 4.1 g/dL (ref 3.5–5.0)
Alkaline Phosphatase: 49 U/L (ref 38–126)
Anion gap: 8 (ref 5–15)
BUN: 39 mg/dL — ABNORMAL HIGH (ref 6–20)
CO2: 25 mmol/L (ref 22–32)
Calcium: 9 mg/dL (ref 8.9–10.3)
Chloride: 106 mmol/L (ref 98–111)
Creatinine, Ser: 0.7 mg/dL (ref 0.44–1.00)
GFR calc Af Amer: >60 mL/min (ref >60)
GFR calc non Af Amer: >60 mL/min (ref >60)
Glucose, Bld: 106 mg/dL — ABNORMAL HIGH (ref 70–99)
Potassium: 4 mmol/L (ref 3.5–5.1)
Sodium: 139 mmol/L (ref 135–145)
Total Bilirubin: 0.4 mg/dL (ref 0.3–1.2)
Total Protein: 6.9 g/dL (ref 6.5–8.1)

## 2018-12-09 LAB — TYPE AND SCREEN
ABO/RH(D): O POS
Antibody Screen: NEGATIVE

## 2018-12-09 LAB — IRON AND TIBC
Iron: 162 ug/dL (ref 28–170)
Saturation Ratios: 50 % — ABNORMAL HIGH (ref 10.4–31.8)
TIBC: 325 ug/dL (ref 250–450)
UIBC: 163 ug/dL

## 2018-12-09 LAB — FERRITIN: Ferritin: 28 ng/mL (ref 11–307)

## 2018-12-09 MED ORDER — IRON (FERROUS SULFATE) 325 (65 FE) MG PO TABS: 1.0000 | ORAL_TABLET | Freq: Every day | ORAL | 3 refills | Status: DC

## 2018-12-09 MED ORDER — SODIUM CHLORIDE 0.9 % IV BOLUS
1000.0000 mL | Freq: Once | INTRAVENOUS | Status: AC
  Administered 2018-12-09: 23:00:00 1000 mL via INTRAVENOUS

## 2018-12-09 MED ORDER — PANTOPRAZOLE SODIUM 40 MG PO TBEC: 40.0000 mg | DELAYED_RELEASE_TABLET | Freq: Every day | ORAL | 1 refills | Status: DC

## 2018-12-09 MED ORDER — SODIUM CHLORIDE 0.9% FLUSH: 3.0000 mL | Freq: Once | INTRAVENOUS | Status: DC

## 2018-12-09 MED ORDER — PANTOPRAZOLE SODIUM 40 MG IV SOLR
40.0000 mg | Freq: Once | INTRAVENOUS | Status: AC
  Administered 2018-12-09: 23:00:00 40 mg via INTRAVENOUS
  Filled 2018-12-09: qty 40

## 2018-12-09 NOTE — ED Provider Notes (Signed)
-----------------------------------------   11:02 PM on 12/09/2018 -----------------------------------------  Blood pressure 120/83, pulse (!) 105, temperature 98.6 F (37 C), resp. rate 20, height 5\' 7"  (1.702 m), weight 90.7 kg, SpO2 100 %.  Assuming care from Dr. Joni Fears.  In short, Melissa Walton is a 28 y.o. female with a chief complaint of Melena, Emesis, and Tachycardia .  Refer to the original H&P for additional details.  The current plan of care is to recheck H/H at 0200, if tachycardia improved and no further hematemesis or melena, appropriate for d/c home.  Patient with slight drop in hemoglobin from initial check, likely secondary to hydration with IV fluids.  She has not had any further hematemesis or melena, tachycardia now resolved.  She continues to decline admission, referral provided to GI and counseled patient on concerning symptoms for which to return to the ED.  Patient agrees with plan.    Blake Divine, MD 12/10/18 [phone]

## 2018-12-09 NOTE — ED Triage Notes (Signed)
Patient to ED with complaint of black stools since yesterday, three episodes of vomiting today and feeling a little faint. Patient is tachycardic in triage at 131 but was unaware of it. States she has in the past few nights woken up with her chest pounding and feeling shaky. Patient is pale appearing, also states her mother told her the same. Last week she had migraines and took antiinflammatories and Excedrin "a lot."

## 2018-12-09 NOTE — ED Provider Notes (Signed)
[facility] Emergency Department Provider Note  ____________________________________________  Time seen: Approximately 11:05 PM  I have reviewed the triage vital signs and the nursing notes.   HISTORY  Chief Complaint Melena, Emesis, and Tachycardia    HPI Melissa Walton is a 28 y.o. female no significant past medical history who comes to the ED complaining of lightheadedness black stool and vomiting today.  The patient was in her usual state of health about ~2 weeks ago, started having a migraine that would not go away.  She started taking Excedrin multiple times a day over the course of the last 2 weeks to help with the headache.  She was also taking Advil.  It is now better,  but earlier today around 10 AM she started having a series of black bloody bowel movements.  Last 1 was at about 6:30 PM.  After dinner, around 8 PM she also had a series of 3 episodes of vomiting that were reddish although without blood clots or coffee-ground.  She gets lightheaded with standing.  No syncope.  No chest pain or shortness of breath, no body aches fevers chills sweats or sick contacts.     Past Medical History:  Diagnosis Date  . Concussion    high school     Patient Active Problem List   Diagnosis Date Noted  . Suprapubic pressure 07/25/2018  . Lactose intolerance 01/05/2017  . Acute recurrent maxillary sinusitis 02/12/2015  . Easy bruising 01/23/2015  . Overweight (BMI 25.0-29.9) 10/18/2014  . Routine general medical examination at a health care facility 10/18/2014  . GERD (gastroesophageal reflux disease) 01/28/2011     History reviewed. No pertinent surgical history.   Prior to Admission medications   Medication Sig Start Date End Date Taking? Authorizing Provider  aspirin-acetaminophen-caffeine (EXCEDRIN MIGRAINE) [phone] MG tablet Take 1 tablet by mouth every 6 (six) hours as needed for headache.   Yes [provider]  cetirizine  (ZYRTEC) 10 MG tablet Take 10 mg by mouth daily.   Yes [provider]  doxycycline (VIBRA-TABS) 100 MG tablet Take 1 tablet (100 mg total) by mouth 2 (two) times daily. 1 po bid 11/29/18  Yes Martin, Mary-Margaret, FNP  ibuprofen (ADVIL,MOTRIN) 200 MG tablet Take 200 mg by mouth every 6 (six) hours as needed.    Yes [provider]  Probiotic Product (PROBIOTIC-10 PO) Take by mouth.   Yes [provider]  Iron, Ferrous Sulfate, 325 (65 Fe) MG TABS Take 1 tablet by mouth daily. 12/09/18   Carrie Mew, MD  pantoprazole (PROTONIX) 40 MG tablet Take 1 tablet (40 mg total) by mouth daily. 12/09/18 02/07/19  Carrie Mew, MD  omeprazole (PRILOSEC OTC) 20 MG tablet Take 1 tablet (20 mg total) by mouth daily. 01/28/11 02/03/12  Jackolyn Confer, MD     Allergies Loracarbef and Penicillins   Family History  Problem Relation Age of Onset  . Diabetes Mother   . Hyperlipidemia Mother   . Hypertension Father   . Gout Father   . Heart disease Maternal Grandfather   . Ovarian cancer Paternal Grandmother     Social History Social History   Tobacco Use  . Smoking status: Never Smoker  . Smokeless tobacco: Never Used  Substance Use Topics  . Alcohol use: No  . Drug use: No    Review of Systems  Constitutional:   No fever or chills.  ENT:   No sore throat. No rhinorrhea. Cardiovascular:   No chest pain or syncope.  Respiratory:   No dyspnea or cough. Gastrointestinal:   Negative for abdominal pain, vomiting and diarrhea.  Musculoskeletal:   Negative for focal pain or swelling All other systems reviewed and are negative except as documented above in ROS and HPI.  ____________________________________________   PHYSICAL EXAM:  VITAL SIGNS: ED Triage Vitals  Enc Vitals Group     BP 12/09/18 2145 120/83     Pulse Rate 12/09/18 2145 (!) 131     Resp 12/09/18 2145 20     Temp 12/09/18 2145 98.6 F (37 C)     Temp src --      SpO2 12/09/18 2230 100  %     Weight 12/09/18 2147 200 lb (90.7 kg)     Height 12/09/18 2147 5\' 7"  (1.702 m)     Head Circumference --      Peak Flow --      Pain Score 12/09/18 2147 4     Pain Loc --      Pain Edu? --      Excl. in GC? --     Vital signs reviewed, nursing assessments reviewed.   Constitutional:   Alert and oriented. Non-toxic appearance. Eyes:   Conjunctivae are normal. EOMI. PERRL. ENT      Head:   Normocephalic and atraumatic.      Nose:   Wearing a mask.      Mouth/Throat:   Moist mucous membranes      Neck:   No meningismus. Full ROM. Hematological/Lymphatic/Immunilogical:   No cervical lymphadenopathy. Cardiovascular:   Tachycardia heart rate 120, improves to 110 lying supine. Symmetric bilateral radial and DP pulses.  No murmurs. Cap refill less than 2 seconds. Respiratory:   Normal respiratory effort without tachypnea/retractions. Breath sounds are clear and equal bilaterally. No wheezes/rales/rhonchi. Gastrointestinal:   Soft and nontender. Non distended. There is no CVA tenderness.  No rebound, rigidity, or guarding.  Rectal exam performed with nurse 2148 at bedside, scant stool in the vault, Hemoccult positive. Musculoskeletal:   Normal range of motion in all extremities. No joint effusions.  No lower extremity tenderness.  No edema. Neurologic:   Normal speech and language.  Motor grossly intact. No acute focal neurologic deficits are appreciated.  Skin:    Skin is warm, dry and intact. No rash noted.  No petechiae, purpura, or bullae.  ____________________________________________    LABS (pertinent positives/negatives) (all labs ordered are listed, but only abnormal results are displayed) Labs Reviewed  COMPREHENSIVE METABOLIC PANEL - Abnormal; Notable for the following components:      Result Value   Glucose, Bld 106 (*)    BUN 39 (*)    All other components within normal limits  CBC - Abnormal; Notable for the following components:   RBC 3.31 (*)    Hemoglobin 10.3  (*)    HCT 31.4 (*)    All other components within normal limits  IRON AND TIBC  FERRITIN  TYPE AND SCREEN   ____________________________________________   EKG    ____________________________________________    RADIOLOGY  No results found.  ____________________________________________   PROCEDURES Procedures  ____________________________________________    CLINICAL IMPRESSION / ASSESSMENT AND PLAN / ED COURSE  Medications ordered in the ED: Medications  sodium chloride flush (NS) 0.9 % injection 3 mL (has no administration in time range)  sodium chloride 0.9 % bolus 1,000 mL (1,000 mLs Intravenous New Bag/Given 12/09/18 2243)  pantoprazole (PROTONIX) injection 40 mg (40 mg Intravenous Given 12/09/18 2257)    Pertinent  labs & imaging results that were available during my care of the patient were reviewed by me and considered in my medical decision making (see chart for details).  Melissa Walton was evaluated in Emergency Department on 12/09/2018 for the symptoms described in the history of present illness. She was evaluated in the context of the global COVID-19 pandemic, which necessitated consideration that the patient might be at risk for infection with the SARS-CoV-2 virus that causes COVID-19. Institutional protocols and algorithms that pertain to the evaluation of patients at risk for COVID-19 are in a state of rapid change based on information released by regulatory bodies including the CDC and federal and state organizations. These policies and algorithms were followed during the patient's care in the ED.   Patient presents with lightheadedness vomiting black stool, consistent with upper GI bleed.  HPI and presentation are consistent with an NSAID induced erosive gastritis.  Blood pressure is normal, she is nontoxic, overall feeling okay.  Hemoglobin has decreased from a baseline of about 13 to 10.5 today.    Offered admission, but patient strongly prefers to go  home if possible.  Given that she is young and healthy, has not had any bowel movement for several hours and has no appreciable stool in the rectal vault, no vomiting for several hours, I think this would be reasonable.    We will plan to recheck her hemoglobin after a 4-hour interval, around 2 AM.  Give IV fluids for hydration in the meantime to improve tachycardia.  If she is not having any more bloody emesis or melanotic stool, hemoglobin has not substantially decreased from the first check tonight, would be okay to discharge home on PPI and iron supplementation to follow-up with gastroenterology this week.  I counseled the patient to avoid any NSAIDs going forward including aspirin ibuprofen naproxen or Tylenol for now.      ____________________________________________   FINAL CLINICAL IMPRESSION(S) / ED DIAGNOSES    Final diagnoses:  Acute upper GI bleed     ED Discharge Orders         Ordered    Iron, Ferrous Sulfate, 325 (65 Fe) MG TABS  Daily     12/09/18 2304    pantoprazole (PROTONIX) 40 MG tablet  Daily     12/09/18 2304          Portions of this note were generated with dragon dictation software. Dictation errors may occur despite best attempts at proofreading.   Sharman CheekStafford, Moxon Messler, MD 12/09/18 2311

## 2018-12-10 DIAGNOSIS — K922 Gastrointestinal hemorrhage, unspecified: Secondary | ICD-10-CM | POA: Diagnosis not present

## 2018-12-10 LAB — CBC
HCT: 27.2 % — ABNORMAL LOW (ref 36.0–46.0)
Hemoglobin: 9.1 g/dL — ABNORMAL LOW (ref 12.0–15.0)
MCH: 31.5 pg (ref 26.0–34.0)
MCHC: 33.5 g/dL (ref 30.0–36.0)
MCV: 94.1 fL (ref 80.0–100.0)
Platelets: 257 10*3/uL (ref 150–400)
RBC: 2.89 MIL/uL — ABNORMAL LOW (ref 3.87–5.11)
RDW: 11.8 % (ref 11.5–15.5)
WBC: 9.2 10*3/uL (ref 4.0–10.5)
nRBC: 0 % (ref 0.0–0.2)

## 2018-12-12 ENCOUNTER — Ambulatory Visit (INDEPENDENT_AMBULATORY_CARE_PROVIDER_SITE_OTHER): Payer: Managed Care, Other (non HMO) | Admitting: Family Medicine

## 2018-12-12 ENCOUNTER — Encounter: Payer: Self-pay | Admitting: Family Medicine

## 2018-12-12 ENCOUNTER — Other Ambulatory Visit: Payer: Self-pay

## 2018-12-12 VITALS — BP 122/78 | HR 94 | Temp 98.0°F | Wt 198.0 lb

## 2018-12-12 DIAGNOSIS — R000 Tachycardia, unspecified: Secondary | ICD-10-CM

## 2018-12-12 DIAGNOSIS — K922 Gastrointestinal hemorrhage, unspecified: Secondary | ICD-10-CM | POA: Diagnosis not present

## 2018-12-12 DIAGNOSIS — K921 Melena: Secondary | ICD-10-CM

## 2018-12-12 DIAGNOSIS — K920 Hematemesis: Secondary | ICD-10-CM

## 2018-12-12 LAB — CBC
HCT: 26.3 % — ABNORMAL LOW (ref 36.0–46.0)
Hemoglobin: 9.1 g/dL — ABNORMAL LOW (ref 12.0–15.0)
MCHC: 34.4 g/dL (ref 30.0–36.0)
MCV: 94.5 fl (ref 78.0–100.0)
Platelets: 236 10*3/uL (ref 150.0–400.0)
RBC: 2.78 Mil/uL — ABNORMAL LOW (ref 3.87–5.11)
RDW: 12.4 % (ref 11.5–15.5)
WBC: 4.2 10*3/uL (ref 4.0–10.5)

## 2018-12-12 LAB — BASIC METABOLIC PANEL
BUN: 14 mg/dL (ref 6–23)
CO2: 27 mEq/L (ref 19–32)
Calcium: 8.9 mg/dL (ref 8.4–10.5)
Chloride: 106 mEq/L (ref 96–112)
Creatinine, Ser: 0.61 mg/dL (ref 0.40–1.20)
GFR: 118.6 mL/min (ref >60.00)
Glucose, Bld: 89 mg/dL (ref 70–99)
Potassium: 3.8 mEq/L (ref 3.5–5.1)
Sodium: 139 mEq/L (ref 135–145)

## 2018-12-12 MED ORDER — SUCRALFATE 1 G PO TABS: 1.0000 g | ORAL_TABLET | Freq: Three times a day (TID) | ORAL | 1 refills | Status: DC

## 2018-12-12 NOTE — Progress Notes (Signed)
Subjective:    Patient ID: Melissa Walton, female    DOB: 06/19/1990, 28 y.o.   MRN: [id number]  HPI   Patient presents to clinic for follow-up after going to the emergency room.  States she had blood in her vomit and some in stool.  Also had a lower hemoglobin level.  States in emergency room her heart rate went up to about 120, after getting IV fluids came down to 105.  Every once in a while she will feel heart pounding, but with sits and rests it improves.  States there was discussion of admitting her, but ER physician allowed her to go home due to hemoglobin levels remaining fairly stable and vomiting symptoms improving.  Patient states her stool yesterday was still little dark in color, but assumes that this is some other blood traveling through her GI tract.  It is suspected excessive use of Excedrin Migraine and ibuprofen is partially to blame.  Patient has been advised to not use these at this time.  She was using a lot of these medications due to migraine headache.  Has been slowly eating back to her normal diet.  Has been keeping up good hydration with water and Gatorade.  No fever or chills.  Urine is normal.  CBC Latest Ref Rng & Units 12/10/2018 12/09/2018 01/05/2017  WBC 4.0 - 10.5 K/uL 9.2 9.6 6.1  Hemoglobin 12.0 - 15.0 g/dL 6.2(X) 10.3(L) 12.9  Hematocrit 36.0 - 46.0 % 27.2(L) 31.4(L) 38.5  Platelets 150 - 400 K/uL 257 318 267.0   Patient Active Problem List   Diagnosis Date Noted  . Suprapubic pressure 07/25/2018  . Lactose intolerance 01/05/2017  . Acute recurrent maxillary sinusitis 02/12/2015  . Easy bruising 01/23/2015  . Overweight (BMI 25.0-29.9) 10/18/2014  . Routine general medical examination at a health care facility 10/18/2014  . GERD (gastroesophageal reflux disease) 01/28/2011   Social History   Tobacco Use  . Smoking status: Never Smoker  . Smokeless tobacco: Never Used  Substance Use Topics  . Alcohol use: No   Review of Systems   Constitutional: Negative for chills, fatigue and fever.  HENT: Negative for congestion, ear pain, sinus pain and sore throat.   Eyes: Negative.   Respiratory: Negative for cough, shortness of breath and wheezing.   Cardiovascular: Negative for chest pain, and leg swelling. Heart pounding at times Gastrointestinal: Negative for abdominal pain, diarrhea, nausea and vomiting. +dark stools Genitourinary: Negative for dysuria, frequency and urgency.  Musculoskeletal: Negative for arthralgias and myalgias.  Skin: Negative for color change, pallor and rash.  Neurological: Negative for syncope, light-headedness and headaches.  Psychiatric/Behavioral: The patient is not nervous/anxious.       Objective:   Physical Exam Vitals signs and nursing note reviewed.  Constitutional:      General: She is not in acute distress.    Appearance: She is not ill-appearing, toxic-appearing or diaphoretic.  HENT:     Head: Normocephalic and atraumatic.  Eyes:     General: No scleral icterus.    Extraocular Movements: Extraocular movements intact.     Conjunctiva/sclera: Conjunctivae normal.     Pupils: Pupils are equal, round, and reactive to light.  Cardiovascular:     Rate and Rhythm: Normal rate and regular rhythm.     Heart sounds: Normal heart sounds.  Pulmonary:     Effort: Pulmonary effort is normal. No respiratory distress.     Breath sounds: Normal breath sounds.  Abdominal:     General: Bowel sounds  are normal. There is no distension.     Palpations: Abdomen is soft. There is no mass.     Tenderness: There is abdominal tenderness (mild epigastric tenderness. ). There is no right CVA tenderness, left CVA tenderness, guarding or rebound.  Musculoskeletal:     Right lower leg: No edema.     Left lower leg: No edema.  Skin:    General: Skin is warm and dry.     Capillary Refill: Capillary refill takes less than 2 seconds.     Coloration: Skin is not jaundiced or pale.  Neurological:      Mental Status: She is alert and oriented to person, place, and time.  Psychiatric:        Mood and Affect: Mood normal.        Behavior: Behavior normal.        Thought Content: Thought content normal.    Today's Vitals   12/12/18 1049  BP: 122/78  Pulse: 94  Temp: 98 F (36.7 C)  SpO2: 99%  Weight: 198 lb (89.8 kg)   Body mass index is 31.01 kg/m.     Assessment & Plan:    A total of 25  minutes were spent face-to-face with the patient during this encounter and over half of that time was spent on counseling and coordination of care. The patient was counseled on ER lab results, plan moving forward.   Upper GI bleed/melena/hematemesis -vomiting has resolved.  Stools were darker yesterday.  Overall feeling better.  We will follow-up with new blood count to be sure things are remaining stable and not worsening.  Advised patient that the blood counts will take some time to regenerate back to normal levels but as long as she is not becoming lower we are in good shape.  I will refer to GI for further evaluation and possibility of a endoscopy.  She will take Carafate to help improve some of her GI symptoms.  Encouraged bland foods and keeping up good fluid intake and slowly advancing diet as tolerated.  Tachycardia-suspect the tachycardia is related to vomiting.  When she got fluids in the ER tachycardia improved.  Suspect feelings of heart pounding at times is related to being slightly anemic at this time, advised to monitor self for any worsening or times where feelings of heart pounding do not resolve with some rest and let us know.  Patient will keep regular follow-up with PCP as planned.  She will return to clinic sooner if any issues arise.  She is aware someone will reach out to her in regards to GI appointments and lab results.

## 2018-12-26 ENCOUNTER — Other Ambulatory Visit (INDEPENDENT_AMBULATORY_CARE_PROVIDER_SITE_OTHER): Payer: Managed Care, Other (non HMO)

## 2018-12-26 ENCOUNTER — Other Ambulatory Visit: Payer: Self-pay

## 2018-12-26 DIAGNOSIS — K920 Hematemesis: Secondary | ICD-10-CM

## 2018-12-26 DIAGNOSIS — K922 Gastrointestinal hemorrhage, unspecified: Secondary | ICD-10-CM | POA: Diagnosis not present

## 2018-12-26 DIAGNOSIS — K921 Melena: Secondary | ICD-10-CM | POA: Diagnosis not present

## 2018-12-26 LAB — CBC
HCT: 32.4 % — ABNORMAL LOW (ref 36.0–46.0)
Hemoglobin: 10.9 g/dL — ABNORMAL LOW (ref 12.0–15.0)
MCHC: 33.6 g/dL (ref 30.0–36.0)
MCV: 95.6 fl (ref 78.0–100.0)
Platelets: 316 10*3/uL (ref 150.0–400.0)
RBC: 3.39 Mil/uL — ABNORMAL LOW (ref 3.87–5.11)
RDW: 13.4 % (ref 11.5–15.5)
WBC: 6.2 10*3/uL (ref 4.0–10.5)

## 2018-12-27 ENCOUNTER — Other Ambulatory Visit: Payer: Managed Care, Other (non HMO)

## 2019-01-06 ENCOUNTER — Ambulatory Visit (INDEPENDENT_AMBULATORY_CARE_PROVIDER_SITE_OTHER): Payer: Managed Care, Other (non HMO) | Admitting: Family Medicine

## 2019-01-06 ENCOUNTER — Other Ambulatory Visit: Payer: Self-pay

## 2019-01-06 ENCOUNTER — Encounter: Payer: Self-pay | Admitting: Family Medicine

## 2019-01-06 VITALS — BP 116/76 | HR 86 | Temp 97.2°F | Ht 67.5 in | Wt 194.8 lb

## 2019-01-06 DIAGNOSIS — K920 Hematemesis: Secondary | ICD-10-CM | POA: Diagnosis not present

## 2019-01-06 DIAGNOSIS — Z23 Encounter for immunization: Secondary | ICD-10-CM | POA: Diagnosis not present

## 2019-01-06 DIAGNOSIS — M7989 Other specified soft tissue disorders: Secondary | ICD-10-CM | POA: Diagnosis not present

## 2019-01-06 DIAGNOSIS — Z0000 Encounter for general adult medical examination without abnormal findings: Secondary | ICD-10-CM | POA: Diagnosis not present

## 2019-01-06 LAB — CBC WITH DIFFERENTIAL/PLATELET
Basophils Absolute: 0.1 10*3/uL (ref 0.0–0.1)
Basophils Relative: 2.1 % (ref 0.0–3.0)
Eosinophils Absolute: 0.1 10*3/uL (ref 0.0–0.7)
Eosinophils Relative: 2.6 % (ref 0.0–5.0)
HCT: 35.4 % — ABNORMAL LOW (ref 36.0–46.0)
Hemoglobin: 11.9 g/dL — ABNORMAL LOW (ref 12.0–15.0)
Lymphocytes Relative: 35.1 % (ref 12.0–46.0)
Lymphs Abs: 1.3 10*3/uL (ref 0.7–4.0)
MCHC: 33.6 g/dL (ref 30.0–36.0)
MCV: 95.3 fl (ref 78.0–100.0)
Monocytes Absolute: 0.5 10*3/uL (ref 0.1–1.0)
Monocytes Relative: 12.4 % — ABNORMAL HIGH (ref 3.0–12.0)
Neutro Abs: 1.8 10*3/uL (ref 1.4–7.7)
Neutrophils Relative %: 47.8 % (ref 43.0–77.0)
Platelets: 277 10*3/uL (ref 150.0–400.0)
RBC: 3.71 Mil/uL — ABNORMAL LOW (ref 3.87–5.11)
RDW: 12.8 % (ref 11.5–15.5)
WBC: 3.8 10*3/uL — ABNORMAL LOW (ref 4.0–10.5)

## 2019-01-06 LAB — LIPID PANEL
Cholesterol: 161 mg/dL (ref 0–200)
HDL: 40.6 mg/dL (ref >39.00)
LDL Cholesterol: 105 mg/dL — ABNORMAL HIGH (ref 0–99)
NonHDL: 120.07
Total CHOL/HDL Ratio: 4
Triglycerides: 77 mg/dL (ref 0.0–149.0)
VLDL: 15.4 mg/dL (ref 0.0–40.0)

## 2019-01-06 LAB — BASIC METABOLIC PANEL
BUN: 13 mg/dL (ref 6–23)
CO2: 26 mEq/L (ref 19–32)
Calcium: 9.2 mg/dL (ref 8.4–10.5)
Chloride: 106 mEq/L (ref 96–112)
Creatinine, Ser: 0.75 mg/dL (ref 0.40–1.20)
GFR: 93.39 mL/min (ref >60.00)
Glucose, Bld: 82 mg/dL (ref 70–99)
Potassium: 3.5 mEq/L (ref 3.5–5.1)
Sodium: 140 mEq/L (ref 135–145)

## 2019-01-06 LAB — TSH: TSH: 1.12 u[IU]/mL (ref 0.35–4.50)

## 2019-01-06 LAB — IBC + FERRITIN
Ferritin: 11.9 ng/mL (ref 10.0–291.0)
Iron: 26 ug/dL — ABNORMAL LOW (ref 42–145)
Saturation Ratios: 7.2 % — ABNORMAL LOW (ref 20.0–50.0)
Transferrin: 258 mg/dL (ref 212.0–360.0)

## 2019-01-06 NOTE — Progress Notes (Signed)
Subjective:    Patient ID: Melissa Walton, female    DOB: 25-Oct-1990, 28 y.o.   MRN: [id number]  HPI  Patient presents to clinic for annual wellness visit.  Also is following up on anemia related to a hematemesis -H&H has been improving as expected with CBC.  Overall patient is feeling well.  Trying to work on healthy diet and regular physical activity with goal of losing some weight.  Feels dealing with the stress of pandemic well.  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  She does see eye doctor usually 1 to 2 years and dentist every 6 months.  Sees GYN for women's health pelvic exams.  Does have an area on right side head beneath the ear.  That is a small lump or mass, states has been there for many many weeks and has not gone away.  Other than being in emergency department 1 month ago, denies other illnesses.  Patient Active Problem List   Diagnosis Date Noted  . Suprapubic pressure 07/25/2018  . Lactose intolerance 01/05/2017  . Acute recurrent maxillary sinusitis 02/12/2015  . Easy bruising 01/23/2015  . Overweight (BMI 25.0-29.9) 10/18/2014  . Routine general medical examination at a health care facility 10/18/2014  . GERD (gastroesophageal reflux disease) 01/28/2011   Social History   Tobacco Use  . Smoking status: Never Smoker  . Smokeless tobacco: Never Used  Substance Use Topics  . Alcohol use: No   History reviewed. No pertinent surgical history.  Family History  Problem Relation Age of Onset  . Diabetes Mother   . Hyperlipidemia Mother   . Hypertension Father   . Gout Father   . Heart disease Maternal Grandfather   . Ovarian cancer Paternal Grandmother    Review of Systems  Constitutional: Negative for chills, fatigue and fever.  HENT: Negative for congestion, ear pain, sinus pain and sore throat. ?mass/lymphnode near right ear Eyes: Negative.   Respiratory: Negative for cough, shortness of breath and  wheezing.   Cardiovascular: Negative for chest pain, palpitations and leg swelling.  Gastrointestinal: Negative for abdominal pain, diarrhea, nausea and vomiting.  Genitourinary: Negative for dysuria, frequency and urgency.  Musculoskeletal: Negative for arthralgias and myalgias.  Skin: Negative for color change, pallor and rash.  Neurological: Negative for syncope, light-headedness and headaches.  Psychiatric/Behavioral: The patient is not nervous/anxious.       Objective:   Physical Exam Vitals signs and nursing note reviewed.  Constitutional:      General: She is not in acute distress.    Appearance: She is not ill-appearing, toxic-appearing or diaphoretic.  HENT:     Head: Normocephalic and atraumatic.      Comments: ?mass area indicated by red mark on diagram. Does not feel like an obviously swollen lymphnode.     Right Ear: Tympanic membrane, ear canal and external ear normal.     Left Ear: Tympanic membrane, ear canal and external ear normal.  Eyes:     General: No scleral icterus.    Extraocular Movements: Extraocular movements intact.     Conjunctiva/sclera: Conjunctivae normal.     Pupils: Pupils are equal, round, and reactive to light.  Neck:     Musculoskeletal: Normal range of motion and neck supple. No neck rigidity.  Cardiovascular:     Rate and Rhythm: Normal rate and regular rhythm.     Heart sounds: Normal heart sounds.  Pulmonary:     Effort: Pulmonary effort is  normal. No respiratory distress.     Breath sounds: Normal breath sounds.  Abdominal:     General: Bowel sounds are normal. There is no distension.     Palpations: Abdomen is soft.     Tenderness: There is no abdominal tenderness. There is no right CVA tenderness, left CVA tenderness, guarding or rebound.  Musculoskeletal:     Right lower leg: No edema.     Left lower leg: No edema.  Skin:    General: Skin is warm and dry.     Capillary Refill: Capillary refill takes less than 2 seconds.      Coloration: Skin is not jaundiced or pale.  Neurological:     Mental Status: She is alert and oriented to person, place, and time.     Gait: Gait normal.  Psychiatric:        Mood and Affect: Mood normal.        Behavior: Behavior normal.    Today's Vitals   01/06/19 1003  BP: 116/76  Pulse: 86  Temp: (!) 97.2 F (36.2 C)  TempSrc: Temporal  SpO2: 98%  Weight: 194 lb 12.8 oz (88.4 kg)  Height: 5' 7.5" (1.715 m)   Body mass index is 30.06 kg/m.     Assessment & Plan:    Well adult health check - Plan: Basic metabolic panel, TSH, Lipid panel  Hematemesis, presence of nausea not specified - Plan: CBC with Differential/Platelet, IBC + Ferritin  Mass of soft tissue of neck - Plan: US Soft Tissue Head/Neck  Need for Tdap vaccination - Plan: Tdap vaccine greater than or equal to 7yo IM  Overall patient appears to be healthy 28 year old female.  We will get blood work in clinic today including iron panel to follow-up after having anemia.  We will get ultrasound of the soft tissue of head and neck to investigate the possible mass felt the need for right ear.  Reviewed healthy diet and regular physical activity, recommended balanced diet with lean proteins, various fruit and vegetables, whole grains and good water intake.  Discussed safe sun practices including wearing SPF of at least 30 when outdoors.  She always wear seatbelt in car.  She sees eye doctor and dentist regularly.  Declines flu vaccine.  Tetanus vaccine update given in clinic.  Patient will follow-up regularly with PCP if she normally does.  She will also follow-up annually for physical exam.

## 2019-01-10 ENCOUNTER — Ambulatory Visit: Payer: Self-pay | Admitting: Urology

## 2019-01-13 ENCOUNTER — Other Ambulatory Visit: Payer: Self-pay

## 2019-01-18 ENCOUNTER — Encounter

## 2019-01-18 ENCOUNTER — Ambulatory Visit: Payer: Managed Care, Other (non HMO) | Admitting: Gastroenterology

## 2019-01-20 ENCOUNTER — Ambulatory Visit
Admission: RE | Admit: 2019-01-20 | Discharge: 2019-01-20 | Disposition: A | Discharge status: Home / Self Care | Payer: Managed Care, Other (non HMO) | Source: Ambulatory Visit | Attending: Family Medicine | Admitting: Family Medicine

## 2019-01-20 ENCOUNTER — Other Ambulatory Visit: Payer: Self-pay

## 2019-01-20 DIAGNOSIS — M7989 Other specified soft tissue disorders: Secondary | ICD-10-CM | POA: Insufficient documentation

## 2019-01-20 IMAGING — US US SOFT TISSUE HEAD/NECK
1 series · 11 of 11 positions shown · non-contrast
Comparison: None.

CLINICAL DATA: Soft tissue lump near base of right ear.

EXAM:
ULTRASOUND OF HEAD/NECK SOFT TISSUES
TECHNIQUE: Ultrasound examination of the head and neck soft tissues was
performed in the area of clinical concern.

[Series 1: us soft tissue head/neck · 0.07mm/px · 11 of 11 slices shown]
[im 1/11]
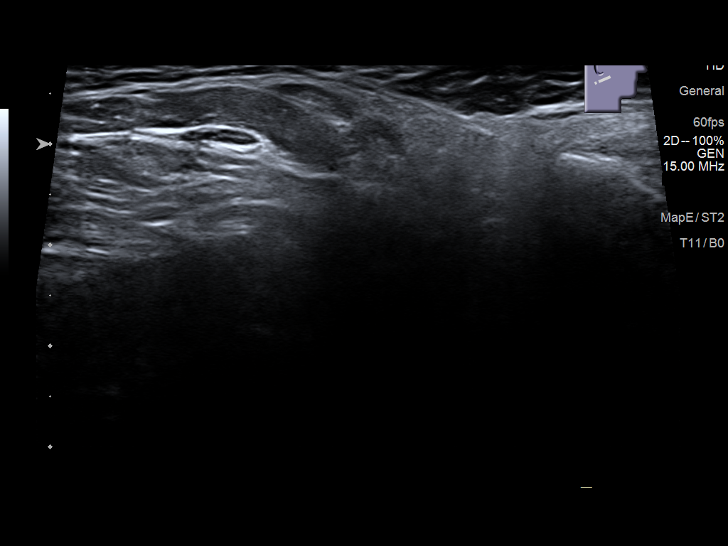
[im 2/11]
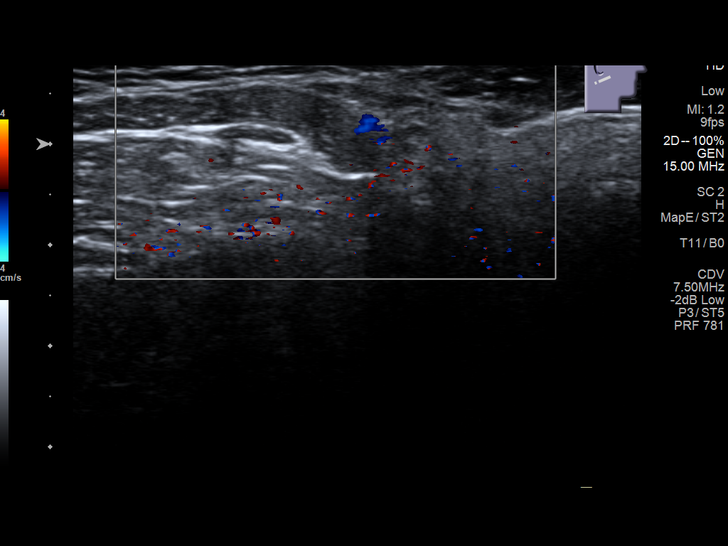
[im 3/11]
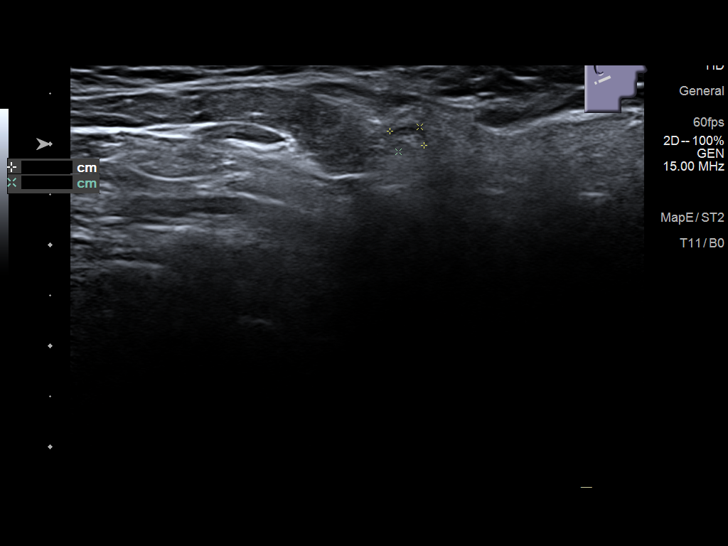
[im 4/11]
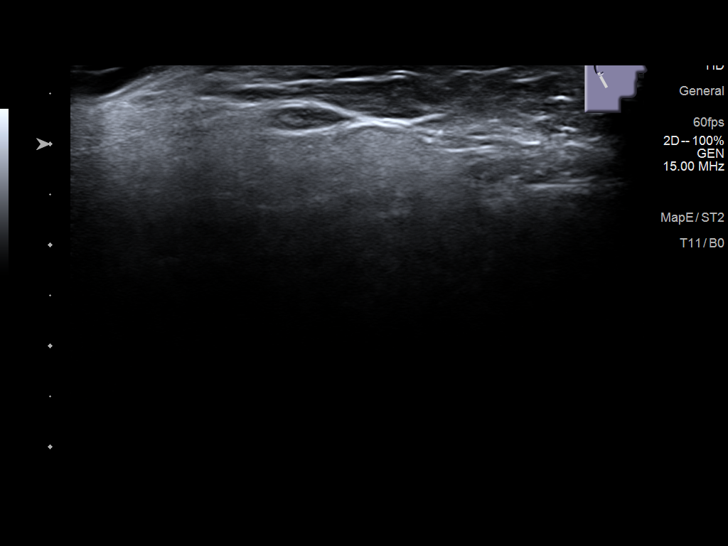
[im 5/11]
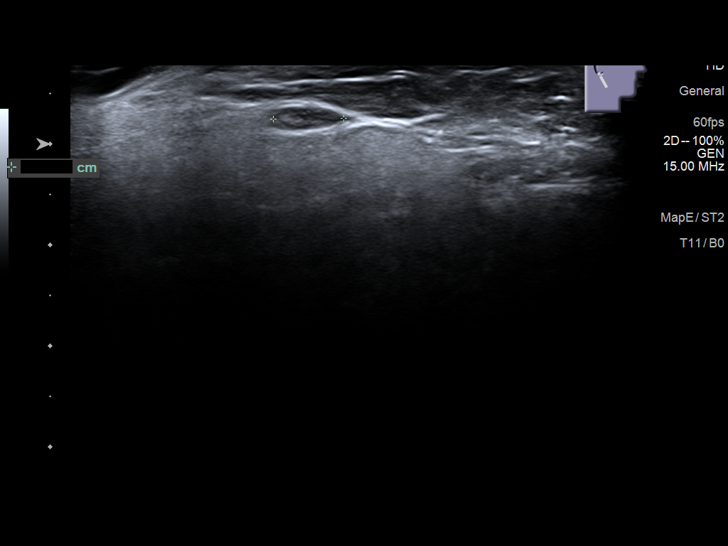
[im 6/11]
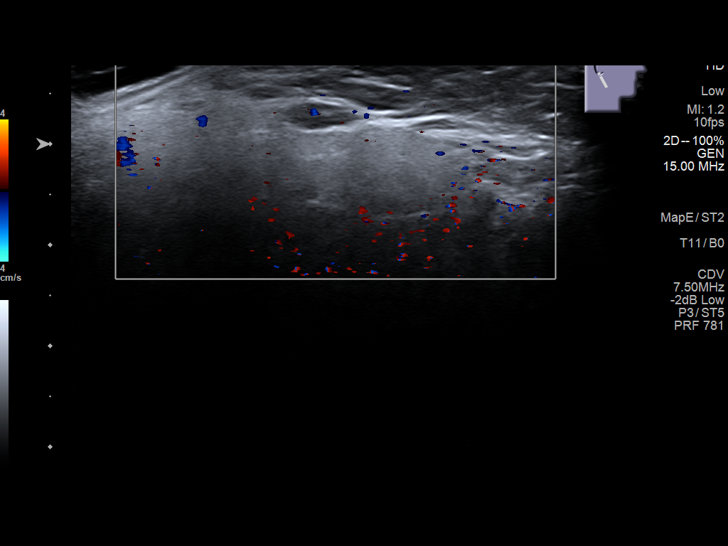
[im 7/11]
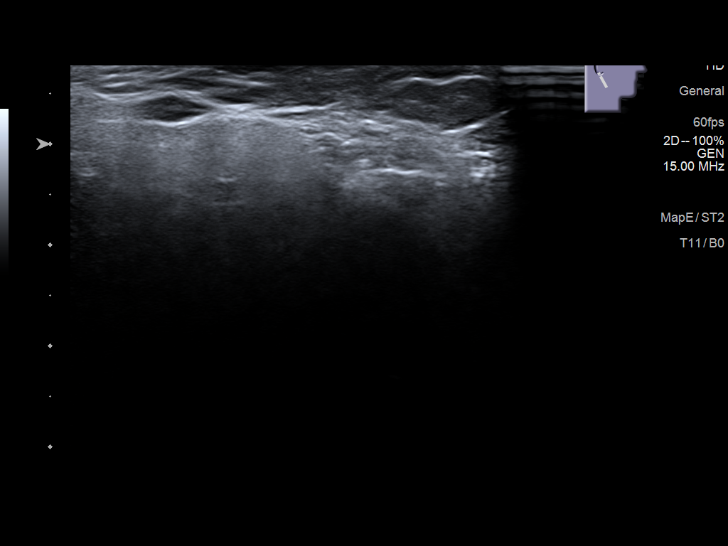
[im 8/11]
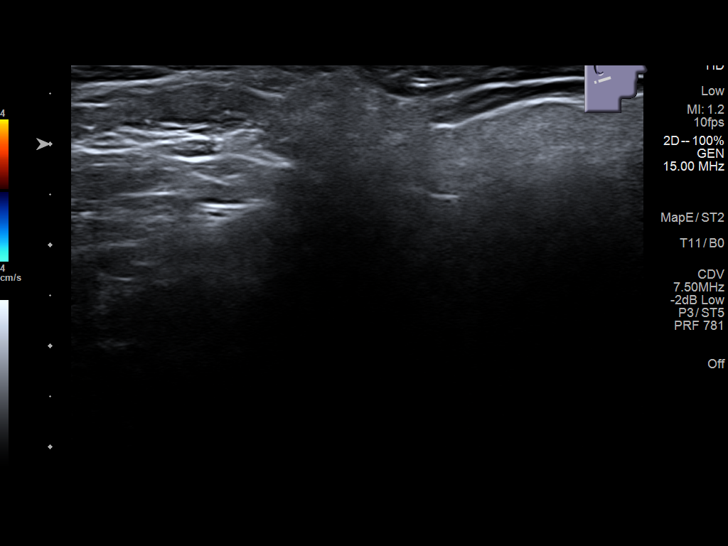
[im 9/11]
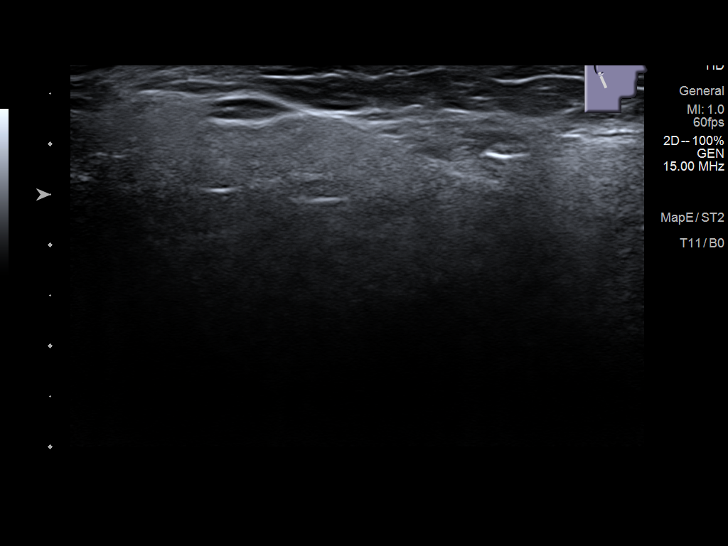
[im 10/11]
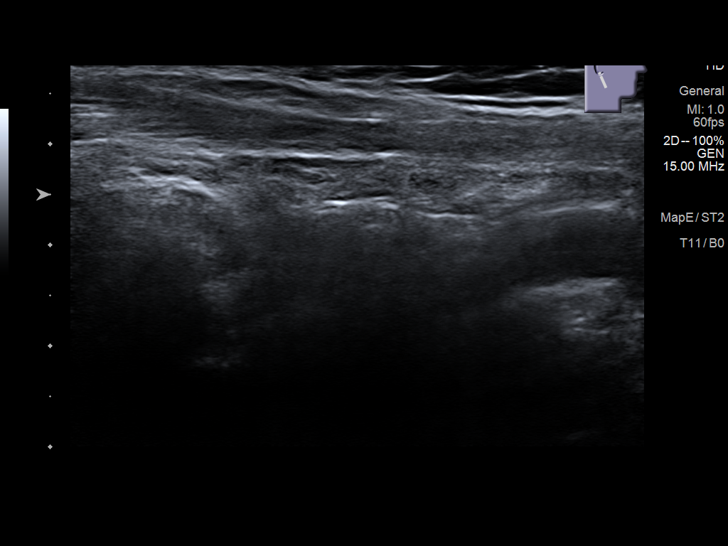
[im 11/11]
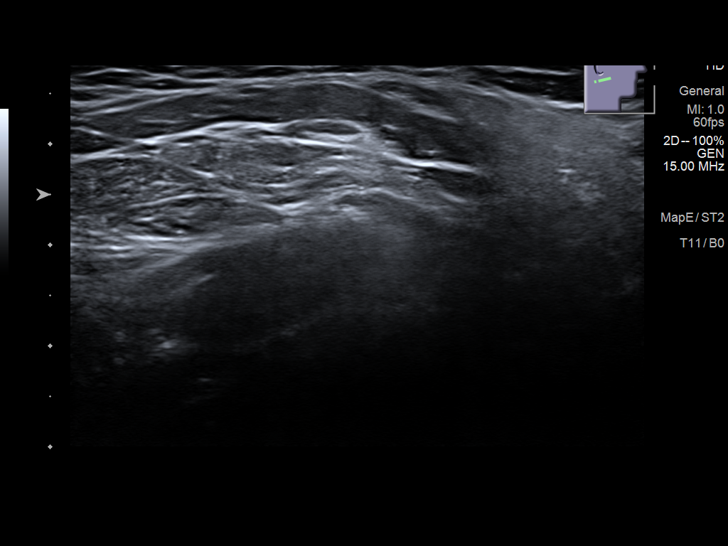

[11 of 11 positions shown; findings below may reference images not displayed]

FINDINGS: In the patient's palpable area of concern, there is no significant
sonographic abnormality. There is no large fluid collection or
concerning mass. There is a small 0.7 x 0.4 x 0.3 cm nodule that is
favored to represent a normal lymph node.
IMPRESSION: 1. No concerning sonographic abnormality in the patient's palpable
area of concern.
2. Small normal appearing lymph node noted in the region of the
patient's concern. If there is continued growth, short-term
follow-up is recommended.

## 2019-02-08 ENCOUNTER — Encounter: Payer: Self-pay | Admitting: Urology

## 2019-02-08 ENCOUNTER — Ambulatory Visit (INDEPENDENT_AMBULATORY_CARE_PROVIDER_SITE_OTHER): Payer: Managed Care, Other (non HMO) | Admitting: Urology

## 2019-02-08 ENCOUNTER — Other Ambulatory Visit: Payer: Self-pay

## 2019-02-08 VITALS — BP 124/77 | HR 83 | Ht 67.5 in | Wt 196.0 lb

## 2019-02-08 DIAGNOSIS — Q513 Bicornate uterus: Secondary | ICD-10-CM

## 2019-02-08 DIAGNOSIS — R102 Pelvic and perineal pain: Secondary | ICD-10-CM

## 2019-02-08 LAB — URINALYSIS, COMPLETE
Bilirubin, UA: NEGATIVE
Glucose, UA: NEGATIVE
Ketones, UA: NEGATIVE
Leukocytes,UA: NEGATIVE
Nitrite, UA: NEGATIVE
Protein,UA: NEGATIVE
RBC, UA: NEGATIVE
Specific Gravity, UA: 1.02 (ref 1.005–1.030)
Urobilinogen, Ur: 0.2 mg/dL (ref 0.2–1.0)
pH, UA: 7 (ref 5.0–7.5)

## 2019-02-08 LAB — MICROSCOPIC EXAMINATION
Bacteria, UA: NONE SEEN
RBC, Urine: NONE SEEN /hpf (ref 0–2)
WBC, UA: NONE SEEN /hpf (ref 0–5)

## 2019-02-08 NOTE — Progress Notes (Signed)
02/08/2019 3:43 PM   Melissa Walton 1990/11/17 [id number]  Referring provider: [name], FNP [address] [place],  [place] [address]  Chief Complaint  Patient presents with  . Abdominal Pain    New patient    HPI: Pleasant 28 year old female who presents today for further evaluation of possible renal anomalies.  Notably, the patient was seen and evaluated by urogynecology at [facility] for suprapubic tenderness, dysuria and pelvic floor dysfunction.  She is been working with physical therapy with some improvement in her urinary symptoms.  As part of this evaluation, she had a transvaginal ultrasound which was suggestive of a bicornuate uterus.  This was followed up with an MRI on 08/31/2018 which showed a normal urinary bladder but a septated bicornuate uterus.  Upper tracts were not sufficiently evaluated.  She is never had any upper tract imaging in the past.  No history of flank pain, significant UTIs, pyelonephritis, kidney stones, or any other issues.  Her renal function is normal.  No family history of renal anomalies.  She is otherwise fairly healthy.   PMH: Past Medical History:  Diagnosis Date  . Concussion    high school    Surgical History: No past surgical history on file.  Home Medications:  Allergies as of 02/08/2019      Reactions   Loracarbef Rash   Penicillins Rash      Medication List       Accurate as of February 08, 2019  3:43 PM. If you have any questions, ask your nurse or doctor.        STOP taking these medications   aspirin-acetaminophen-caffeine 250-250-65 MG tablet Commonly known as: EXCEDRIN MIGRAINE Stopped by: Vanna Scotland, MD   pantoprazole 40 MG tablet Commonly known as: Protonix Stopped by: Vanna Scotland, MD     TAKE these medications   Iron (Ferrous Sulfate) 325 (65 Fe) MG Tabs Take 1 tablet by mouth daily.   PROBIOTIC-10 PO Take by mouth.   ZyrTEC Allergy 10 MG tablet Generic drug:  cetirizine Take 10 mg by mouth daily.       Allergies:  Allergies  Allergen Reactions  . Loracarbef Rash  . Penicillins Rash    Family History: Family History  Problem Relation Age of Onset  . Diabetes Mother   . Hyperlipidemia Mother   . Hypertension Father   . Gout Father   . Heart disease Maternal Grandfather   . Ovarian cancer Paternal Grandmother     Social History:  reports that she has never smoked. She has never used smokeless tobacco. She reports that she does not drink alcohol or use drugs.  ROS: UROLOGY Frequent Urination?: Yes Hard to postpone urination?: Yes Burning/pain with urination?: No Get up at night to urinate?: Yes Leakage of urine?: No Urine stream starts and stops?: No Trouble starting stream?: No Do you have to strain to urinate?: Yes Blood in urine?: No Urinary tract infection?: No Sexually transmitted disease?: No Injury to kidneys or bladder?: No Painful intercourse?: No Weak stream?: No Currently pregnant?: No Vaginal bleeding?: No Last menstrual period?: n  Gastrointestinal Nausea?: No Vomiting?: No Indigestion/heartburn?: No Diarrhea?: No Constipation?: No  Constitutional Fever: No Night sweats?: No Weight loss?: No Fatigue?: No  Skin Skin rash/lesions?: No Itching?: No  Eyes Blurred vision?: No Double vision?: No  Ears/Nose/Throat Sore throat?: No Sinus problems?: No  Hematologic/Lymphatic Swollen glands?: No Easy bruising?: No  Cardiovascular Leg swelling?: No Chest pain?: No  Respiratory Cough?: No Shortness  of breath?: No  Endocrine Excessive thirst?: No  Musculoskeletal Back pain?: No Joint pain?: No  Neurological Headaches?: No Dizziness?: No  Psychologic Depression?: No Anxiety?: No  Physical Exam: BP 124/77   Pulse 83   Ht 5' 7.5" (1.715 m)   Wt 196 lb (88.9 kg)   BMI 30.24 kg/m   Constitutional:  Alert and oriented, No acute distress. HEENT: [place] AT, moist mucus membranes.   Trachea midline, no masses. Cardiovascular: No clubbing, cyanosis, or edema. Respiratory: Normal respiratory effort, no increased work of breathing. Skin: No rashes, bruises or suspicious lesions. Neurologic: Grossly intact, no focal deficits, moving all 4 extremities. Psychiatric: Normal mood and affect.  Laboratory Data: Lab Results  Component Value Date   WBC 3.8 (L) 01/06/2019   HGB 11.9 (L) 01/06/2019   HCT 35.4 (L) 01/06/2019   MCV 95.3 01/06/2019   PLT 277.0 01/06/2019    Lab Results  Component Value Date   CREATININE 0.75 01/06/2019    Lab Results  Component Value Date   HGBA1C 5.0 10/18/2014    Urinalysis    Component Value Date/Time   APPEARANCEUR Hazy (A) 02/08/2019 1115   GLUCOSEU Negative 02/08/2019 1115   BILIRUBINUR Negative 02/08/2019 1115   PROTEINUR Negative 02/08/2019 1115   UROBILINOGEN 0.2 04/22/2017 1039   NITRITE Negative 02/08/2019 1115   LEUKOCYTESUR Negative 02/08/2019 1115    Lab Results  Component Value Date   LABMICR See below: 02/08/2019   WBCUA None seen 02/08/2019   LABEPIT 0-10 02/08/2019   BACTERIA None seen 02/08/2019    Pertinent Imaging: MRI report of the pelvis from 08/31/2018 was reviewed.  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  If this is normal, would not recommend any further evaluation or follow-up at this point in time.  She is agreeable this plan.  We will call her with her renal ultrasound report. - US Renal; Future - Urinalysis, Complete  2. Suprapubic pressure Previously seen and evaluated by urogynecology with improving symptoms - Urinalysis, Complete   Hollice Espy, MD  [facility] [address], [place]  [place],  [address] [phone]

## 2019-02-24 ENCOUNTER — Telehealth: Payer: Managed Care, Other (non HMO) | Admitting: Emergency Medicine

## 2019-02-24 DIAGNOSIS — R300 Dysuria: Secondary | ICD-10-CM | POA: Diagnosis not present

## 2019-02-24 MED ORDER — NITROFURANTOIN MONOHYD MACRO 100 MG PO CAPS: 100.0000 mg | ORAL_CAPSULE | Freq: Two times a day (BID) | ORAL | 0 refills | Status: DC

## 2019-02-24 MED ORDER — FLUCONAZOLE 150 MG PO TABS: ORAL_TABLET | ORAL | 0 refills | Status: DC

## 2019-02-24 NOTE — Progress Notes (Signed)
We are sorry that you are not feeling well.  Here is how we plan to help!  Based on what you shared with me it looks like you most likely have a simple urinary tract infection.  A UTI (Urinary Tract Infection) is a bacterial infection of the bladder.  Most cases of urinary tract infections are simple to treat but a key part of your care is to encourage you to drink plenty of fluids and watch your symptoms carefully.  I have prescribed MacroBid 100 mg twice a day for 5 days.  Your symptoms should gradually improve. Call us if the burning in your urine worsens, you develop worsening fever, back pain or pelvic pain or if your symptoms do not resolve after completing the antibiotic.  I have also prescribed Diflucan 150mg  to be taken once after completing your antibiotics to prevent yeast infections.  Urinary tract infections can be prevented by drinking plenty of water to keep your body hydrated.  Also be sure when you wipe, wipe from front to back and don't hold it in!  If possible, empty your bladder every 4 hours.  Your history of bicornuate uterus can cause upper urinary tract irregularities.  If you develop worsening abdominal pain, fever, vomiting, or any other symptoms you find concerning, please make an in-person visit or be seen at an urgent care or ER.  Your e-visit answers were reviewed by a board certified advanced clinical practitioner to complete your personal care plan.  Depending on the condition, your plan could have included both over the counter or prescription medications.  If there is a problem please reply  once you have received a response from your provider.  Your safety is important to [place].  If you have drug allergies check your prescription carefully.    You can use MyChart to ask questions about today's visit, request a non-urgent call back, or ask for a work or school excuse for 24 hours related to this e-Visit. If it has been greater than 24 hours you will need to follow  up with your provider, or enter a new e-Visit to address those concerns.   You will get an e-mail in the next two days asking about your experience.  I hope that your e-visit has been valuable and will speed your recovery. Thank you for using e-visits.  Greater than 5 minutes, yet less than 10 minutes was used in reviewing the patient's chart, questionnaire, prescribing medications, and documentation for this visit.

## 2019-03-02 ENCOUNTER — Ambulatory Visit: Payer: Managed Care, Other (non HMO)

## 2019-03-10 IMAGING — US US PELVIS COMPLETE WITH TRANSVAGINAL
1 series · 14 of 25 positions shown · non-contrast
Comparison: Ultrasound [DATE]

CLINICAL DATA: Suprapubic pressure

EXAM:
TRANSABDOMINAL AND TRANSVAGINAL ULTRASOUND OF PELVIS
TECHNIQUE: Both transabdominal and transvaginal ultrasound examinations of the
pelvis were performed. Transabdominal technique was performed for
global imaging of the pelvis including uterus, ovaries, adnexal
regions, and pelvic cul-de-sac. It was necessary to proceed with
endovaginal exam following the transabdominal exam to visualize the
uterus and endometrium.

[Series 1: us pelvis complete with transvaginal · 0.24mm/px · 14 of 227 slices shown]
[im 1/227]
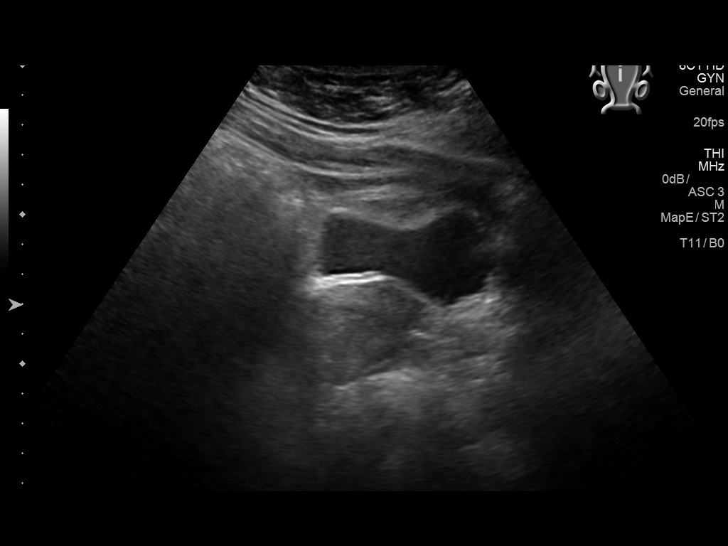
[im 19/227]
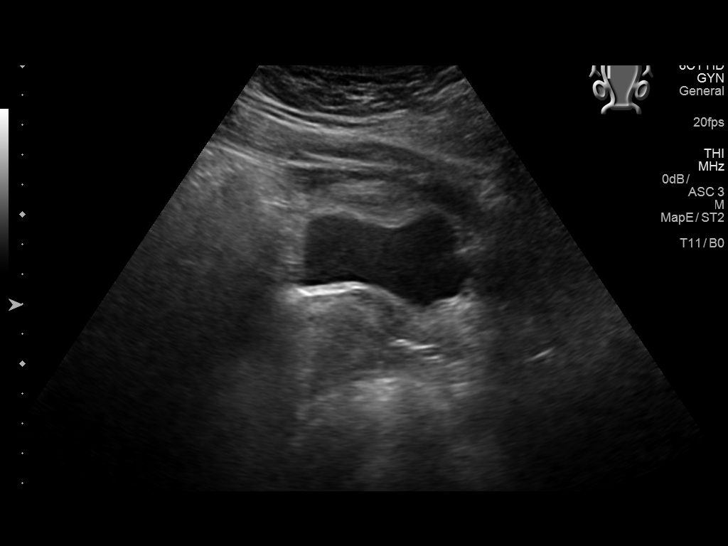
[im 38/227]
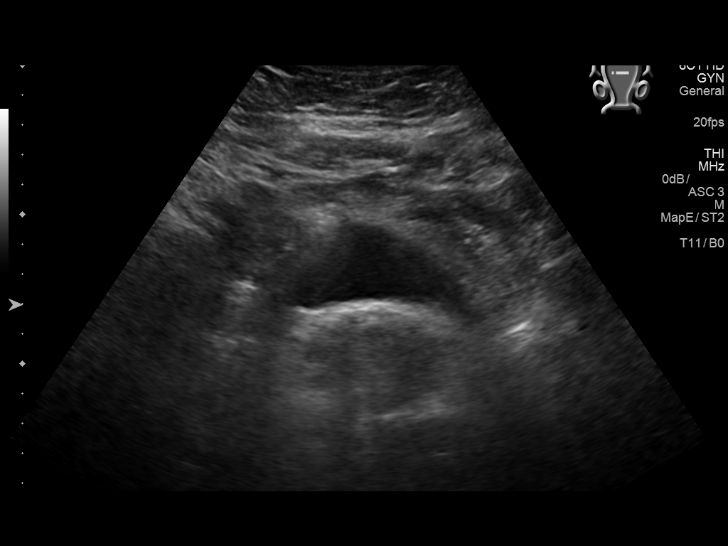
[im 57/227]
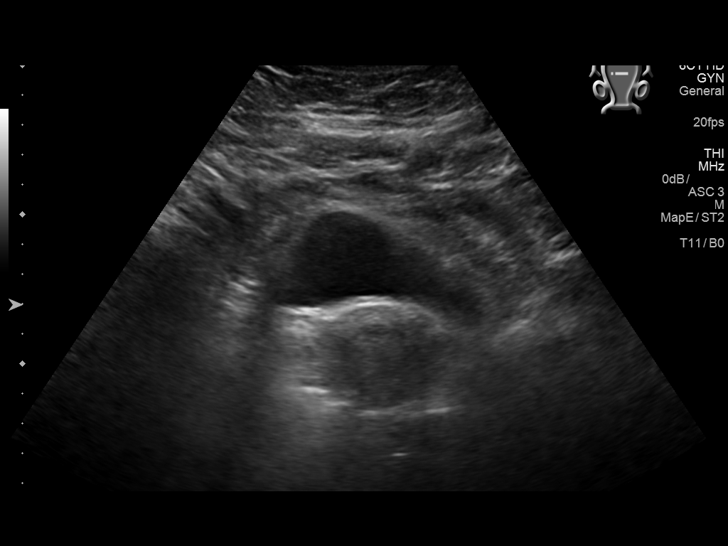
[im 76/227]
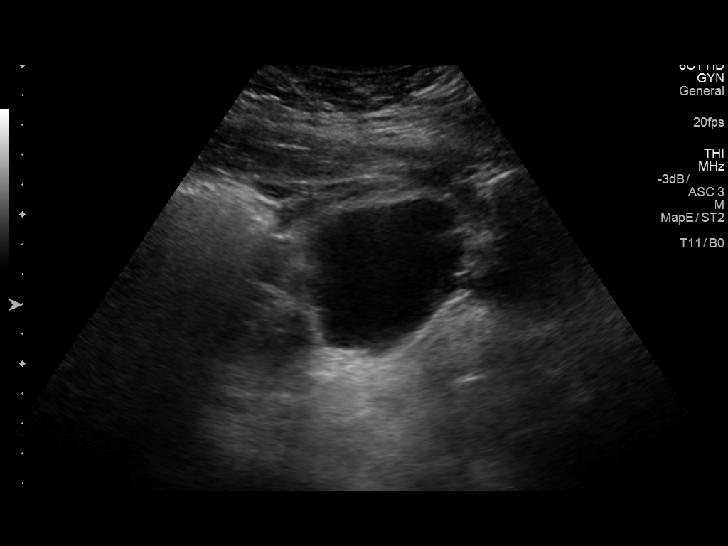
[im 85/227]
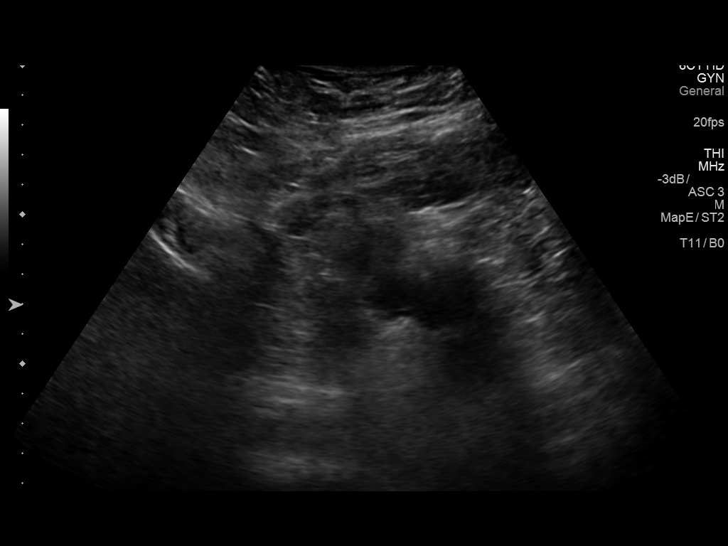
[im 104/227]
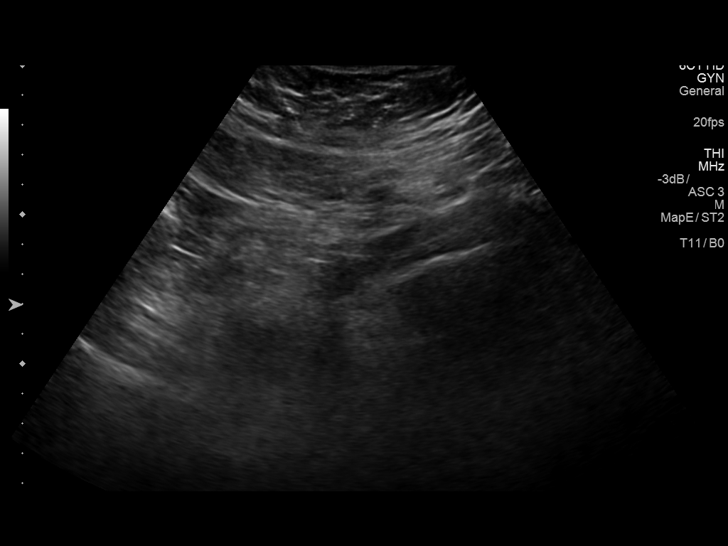
[im 123/227]
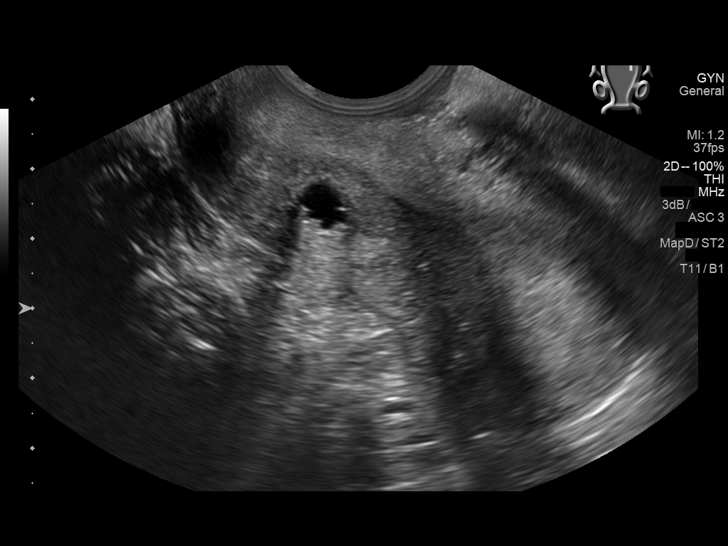
[im 142/227]
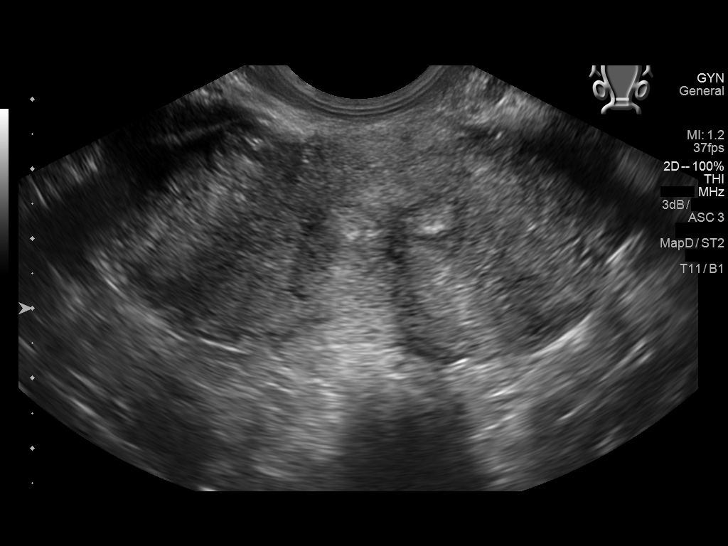
[im 151/227]
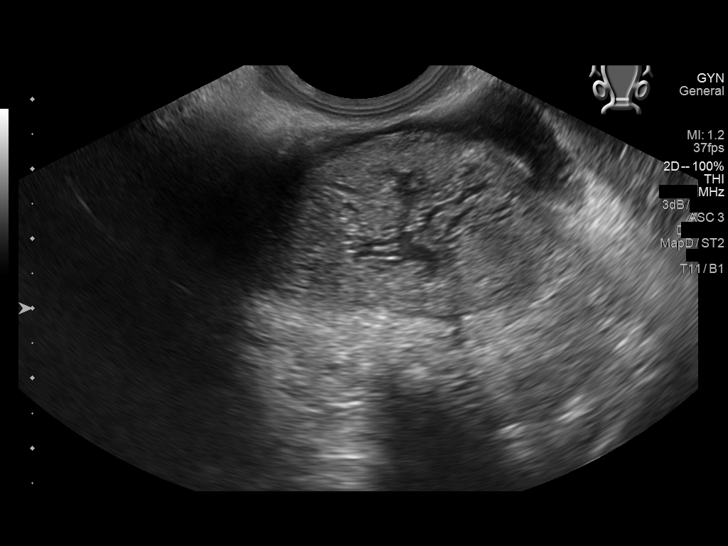
[im 170/227]
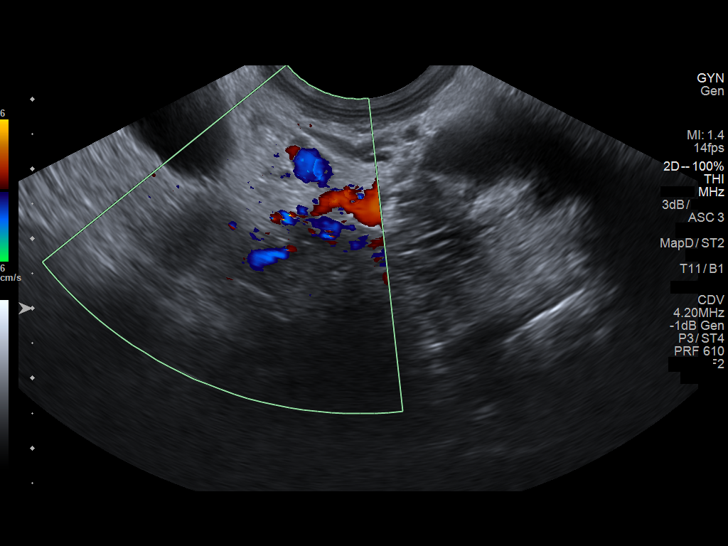
[im 189/227]
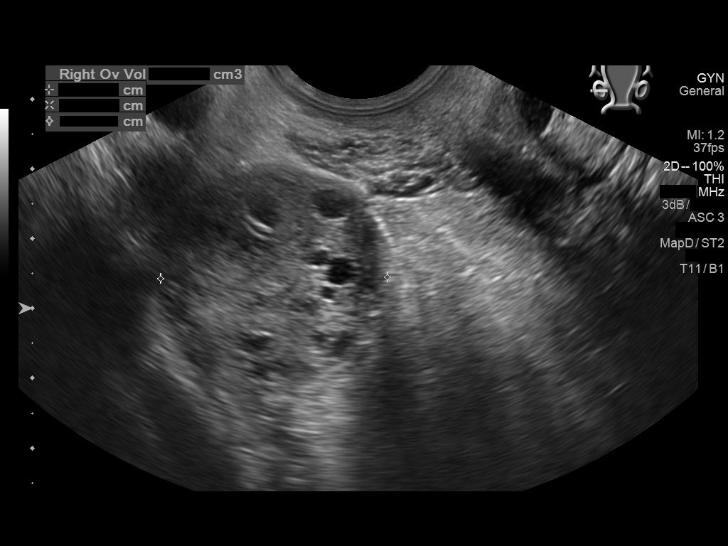
[im 208/227]
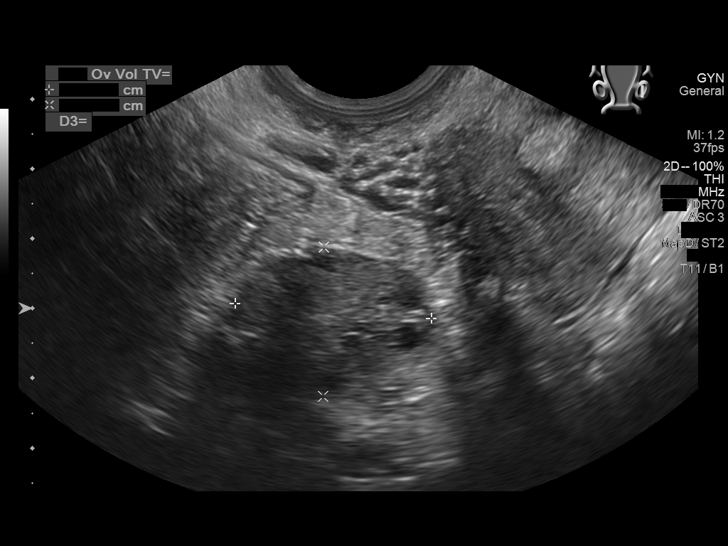
[im 227/227]
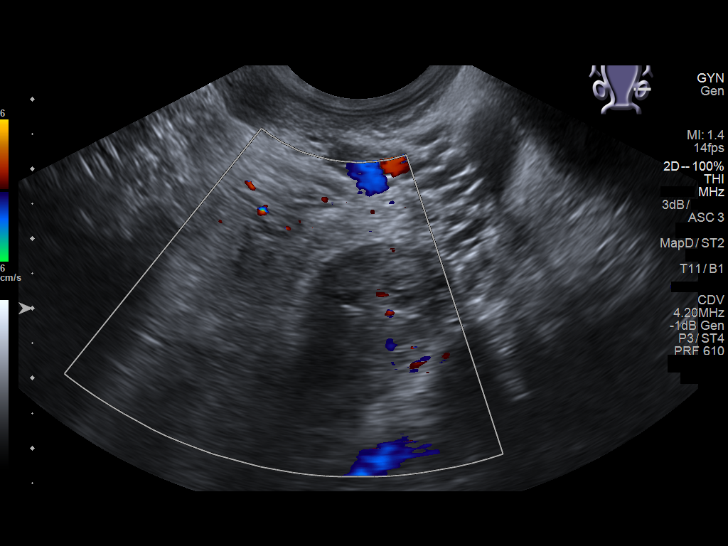

[14 of 25 positions shown; findings below may reference images not displayed]

FINDINGS: Uterus

Measurements: 7 x 3.3 x 6.8 cm = volume: 43.6 mL. Splaying of the
uterine horns. No focal mass.

Endometrium

Thickness: 11 mm right and left horns. Splayed appearance of the
endometrium.

Right ovary

Measurements: 3.6 x 3.7 x 3.3 cm = volume: 23 mL. Normal
appearance/no adnexal mass.

Left ovary

Measurements: 2.3 x 2.1 x 2.1 cm = volume: 6.5 mL. Normal
appearance/no adnexal mass.

Other findings

No abnormal free fluid.
IMPRESSION: Splayed appearance of the endometrial echoes, either reflecting
deeply septated uterus versus bicornuate uterus. Pelvic MRI could be
obtained to better characterize the anatomy.

## 2019-03-12 ENCOUNTER — Encounter: Payer: Self-pay | Admitting: Family

## 2019-03-13 ENCOUNTER — Ambulatory Visit (INDEPENDENT_AMBULATORY_CARE_PROVIDER_SITE_OTHER)
Admission: RE | Admit: 2019-03-13 | Discharge: 2019-03-13 | Disposition: A | Discharge status: Home / Self Care | Payer: Managed Care, Other (non HMO) | Source: Ambulatory Visit

## 2019-03-13 DIAGNOSIS — L03116 Cellulitis of left lower limb: Secondary | ICD-10-CM

## 2019-03-13 MED ORDER — SULFAMETHOXAZOLE-TRIMETHOPRIM 800-160 MG PO TABS: 1.0000 | ORAL_TABLET | Freq: Two times a day (BID) | ORAL | 0 refills | Status: DC

## 2019-03-13 NOTE — ED Provider Notes (Signed)
Virtual Visit via Video Note:  Melissa Walton  initiated request for Telemedicine visit with [facility] team. I connected with Melissa Walton  on 03/13/2019 at 3:06 PM  for a synchronized telemedicine visit using a video enabled HIPPA compliant telemedicine application. I verified that I am speaking with Melissa Walton  using two identifiers. Melissa Balloon, Melissa Walton  was physically located in a [facility] Urgent care site and Melissa Walton was located at a different location.   The limitations of evaluation and management by telemedicine as well as the availability of in-person appointments were discussed. Patient was informed that she  may incur a bill ( including co-pay) for this virtual visit encounter. Melissa Walton  expressed understanding and gave verbal consent to proceed with virtual visit.     History of Present Illness:Melissa Walton  is a 28 y.o. female presents for evaluation of 4 day history of left great toe infection.  She pulled a piece of dead skin off and now the area is tender, swollen, redness, and pus-like drainage.  The redness is confined to her toe only.  She denies fever or chills.  Treatment at home attempted with Neosporin and Advil.  LMP: 2 weeks, denies pregnancy or breastfeeding.     Allergies  Allergen Reactions  . Loracarbef Rash  . Penicillins Rash     Past Medical History:  Diagnosis Date  . Concussion    high school     Social History   Tobacco Use  . Smoking status: Never Smoker  . Smokeless tobacco: Never Used  Substance Use Topics  . Alcohol use: No  . Drug use: No        Observations/Objective: Physical Exam  VITALS: Patient denies fever. GENERAL: Alert, appears well and in no acute distress. HEENT: Atraumatic. NECK: Normal movements of the head and neck. CARDIOPULMONARY: No increased WOB. Speaking in clear sentences. I:E ratio WNL.  MS: Moves all visible extremities without noticeable abnormality. PSYCH:  Pleasant and cooperative, well-groomed. Speech normal rate and rhythm. Affect is appropriate. Insight and judgement are appropriate. Attention is focused, linear, and appropriate.  NEURO: CN grossly intact. Oriented as arrived to appointment on time with no prompting. Moves both UE equally.  SKIN: No obvious lesions, wounds, erythema, or cyanosis noted on face or hands.   Assessment and Plan:    ICD-10-CM   1. Cellulitis of left lower extremity  L03.116        Follow Up Instructions: Treating with Septra DS.  Wound care instructions and signs of worsening infection discussed with patient.  Instructed her to follow-up with a podiatrist or come here to be seen in person if she has signs of worsening infection.  Patient agrees to this plan of care.      I discussed the assessment and treatment plan with the patient. The patient was provided an opportunity to ask questions and all were answered. The patient agreed with the plan and demonstrated an understanding of the instructions.   The patient was advised to call back or seek an in-person evaluation if the symptoms worsen or if the condition fails to improve as anticipated.      Melissa Balloon, Melissa Walton  03/13/2019 3:06 PM         Melissa Balloon, Melissa Walton 03/13/19 [phone]

## 2019-03-13 NOTE — Discharge Instructions (Addendum)
Take the antibiotic as directed.  Keep your wound clean and dry.  Wash it gently twice a day with soap and water.  Apply an antibiotic cream twice a day.    Call the podiatrist listed below to schedule an appointment or come here to be seen in person if you see signs of worsening infection: such as increased pain, redness, pus-like drainage, warmth, fever, chills, or other concerning symptoms.

## 2019-04-03 ENCOUNTER — Ambulatory Visit: Payer: Managed Care, Other (non HMO) | Attending: Internal Medicine

## 2019-04-03 DIAGNOSIS — Z20822 Contact with and (suspected) exposure to covid-19: Secondary | ICD-10-CM

## 2019-04-04 LAB — NOVEL CORONAVIRUS, NAA: SARS-CoV-2, NAA: NOT DETECTED

## 2019-05-31 ENCOUNTER — Telehealth: Payer: Managed Care, Other (non HMO) | Admitting: Family

## 2019-05-31 DIAGNOSIS — R109 Unspecified abdominal pain: Secondary | ICD-10-CM

## 2019-05-31 DIAGNOSIS — R399 Unspecified symptoms and signs involving the genitourinary system: Secondary | ICD-10-CM

## 2019-05-31 NOTE — Progress Notes (Signed)
Based on what you shared with me, I feel your condition warrants further evaluation and I recommend that you be seen for a face to face office visit. ° °Given your symptoms of a UTI with back pain you need to be seen face to face to rule out a more serious infection.  °  °NOTE: If you entered your credit card information for this eVisit, you will not be charged. You may see a "hold" on your card for the $35 but that hold will drop off and you will not have a charge processed. °  °If you are having a true medical emergency please call 911.   °  ° For an urgent face to face visit, [facility] has five urgent care centers for your convenience:  °  °  °NEW:  [facility] [place] °Get Driving Directions °[phone] °[address] °[place], [place] [address] °• 10 am - 6pm Monday - Friday °  ° [facility] ([place]) °Get Driving Directions °[phone] °[address] °, [place] [address] °• 10 am to 8 pm Monday-Friday °• 12 pm to 8 pm Saturday-Sunday °  °  °[facility] °Get Driving Directions °[phone] °[address] °[place], [place] [address] °• 8 am to 8 pm Monday-Friday °• 9 am to 6 pm Saturday °• 11 am to 6 pm Sunday °  °  °[facility] °Get Driving Directions  °[phone] °[address] °[address] °[place], [place] [address] °• 8 am to 8 pm Monday-Friday °• 8 am to 4 pm Saturday-Sunday °  °[facility] [place] °Get Driving Directions °[phone] °[address] °[place], [place] [address] °• 12 pm to 6 pm Monday-Friday  °  °  °Your e-visit answers were reviewed by a board certified advanced clinical practitioner to complete your personal care plan.  Thank you for using e-Visits. °  ° ° °

## 2019-07-04 ENCOUNTER — Telehealth: Payer: Managed Care, Other (non HMO) | Admitting: Physician Assistant

## 2019-07-04 DIAGNOSIS — J349 Unspecified disorder of nose and nasal sinuses: Secondary | ICD-10-CM

## 2019-07-04 MED ORDER — DM-GUAIFENESIN ER 30-600 MG PO TB12: 1.0000 | ORAL_TABLET | Freq: Two times a day (BID) | ORAL | 0 refills | Status: DC | PRN

## 2019-07-04 MED ORDER — IPRATROPIUM BROMIDE 0.03 % NA SOLN: 2.0000 | Freq: Three times a day (TID) | NASAL | 0 refills | Status: DC | PRN

## 2019-07-04 NOTE — Progress Notes (Signed)
We are sorry that you are not feeling well.  Here is how we plan to help!  Based on what you have shared with me it looks like you have sinusitis.  Sinusitis is inflammation and infection in the sinus cavities of the head.  Based on your presentation I believe you most likely have Acute Viral Sinusitis.This is an infection most likely caused by a virus. There is not specific treatment for viral sinusitis other than to help you with the symptoms until the infection runs its course.  You may use an oral decongestant such as Mucinex D or if you have glaucoma or high blood pressure use plain Mucinex. Saline nasal spray help and can safely be used as often as needed for congestion, I have prescribed: Ipratropium Bromide nasal spray 0.03% 2 sprays in each nostril 2-3 times a day.  I have also prescribed mucinex D for you.   Some authorities believe that zinc sprays or the use of Echinacea may shorten the course of your symptoms.  Sinus infections are not as easily transmitted as other respiratory infection, however we still recommend that you avoid close contact with loved ones, especially the very young and elderly.  Remember to wash your hands thoroughly throughout the day as this is the number one way to prevent the spread of infection!  Home Care:  Only take medications as instructed by your medical team.  Do not take these medications with alcohol.  A steam or ultrasonic humidifier can help congestion.  You can place a towel over your head and breathe in the steam from hot water coming from a faucet.  Avoid close contacts especially the very young and the elderly.  Cover your mouth when you cough or sneeze.  Always remember to wash your hands.  Get Help Right Away If:  You develop worsening fever or sinus pain.  You develop a severe head ache or visual changes.  Your symptoms persist after you have completed your treatment plan.  Make sure you  Understand these instructions.  Will  watch your condition.  Will get help right away if you are not doing well or get worse.  Your e-visit answers were reviewed by a board certified advanced clinical practitioner to complete your personal care plan.  Depending on the condition, your plan could have included both over the counter or prescription medications.  If there is a problem please reply  once you have received a response from your provider.  Your safety is important to [place].  If you have drug allergies check your prescription carefully.    You can use MyChart to ask questions about today's visit, request a non-urgent call back, or ask for a work or school excuse for 24 hours related to this e-Visit. If it has been greater than 24 hours you will need to follow up with your provider, or enter a new e-Visit to address those concerns.  You will get an e-mail in the next two days asking about your experience.  I hope that your e-visit has been valuable and will speed your recovery. Thank you for using e-visits.  Greater than 5 minutes, yet less than 10 minutes of time have been spent researching, coordinating, and implementing care for this patient today.

## 2019-07-26 ENCOUNTER — Telehealth: Payer: Managed Care, Other (non HMO) | Admitting: Nurse Practitioner

## 2019-07-26 DIAGNOSIS — J329 Chronic sinusitis, unspecified: Secondary | ICD-10-CM

## 2019-07-26 DIAGNOSIS — B9789 Other viral agents as the cause of diseases classified elsewhere: Secondary | ICD-10-CM

## 2019-07-26 MED ORDER — FLUTICASONE PROPIONATE 50 MCG/ACT NA SUSP: 2.0000 | Freq: Every day | NASAL | 6 refills | Status: DC

## 2019-07-26 NOTE — Progress Notes (Signed)

## 2019-07-31 ENCOUNTER — Telehealth: Payer: Managed Care, Other (non HMO) | Admitting: Nurse Practitioner

## 2019-07-31 DIAGNOSIS — R300 Dysuria: Secondary | ICD-10-CM

## 2019-07-31 MED ORDER — NITROFURANTOIN MONOHYD MACRO 100 MG PO CAPS: 100.0000 mg | ORAL_CAPSULE | Freq: Two times a day (BID) | ORAL | 0 refills | Status: DC

## 2019-07-31 NOTE — Progress Notes (Signed)

## 2019-10-16 ENCOUNTER — Telehealth: Payer: Managed Care, Other (non HMO) | Admitting: Emergency Medicine

## 2019-10-16 DIAGNOSIS — R300 Dysuria: Secondary | ICD-10-CM

## 2019-10-16 MED ORDER — SULFAMETHOXAZOLE-TRIMETHOPRIM 800-160 MG PO TABS: 1.0000 | ORAL_TABLET | Freq: Two times a day (BID) | ORAL | 0 refills | Status: DC

## 2019-10-16 MED ORDER — FLUCONAZOLE 150 MG PO TABS: 150.0000 mg | ORAL_TABLET | Freq: Once | ORAL | 0 refills | Status: AC

## 2019-10-16 NOTE — Progress Notes (Signed)
We are sorry that you are not feeling well.  Here is how we plan to help! ° °Based on what you shared with me it looks like you most likely have a simple urinary tract infection. ° °A UTI (Urinary Tract Infection) is a bacterial infection of the bladder. ° °Most cases of urinary tract infections are simple to treat but a key part of your care is to encourage you to drink plenty of fluids and watch your symptoms carefully. ° °I have prescribed Bactrim DS One tablet twice a day for 5 days.  Your symptoms should gradually improve. Call us if the burning in your urine worsens, you develop worsening fever, back pain or pelvic pain or if your symptoms do not resolve after completing the antibiotic. ° °Urinary tract infections can be prevented by drinking plenty of water to keep your body hydrated.  Also be sure when you wipe, wipe from front to back and don't hold it in!  If possible, empty your bladder every 4 hours. ° °Your e-visit answers were reviewed by a board certified advanced clinical practitioner to complete your personal care plan.  Depending on the condition, your plan could have included both over the counter or prescription medications. ° °If there is a problem please reply  once you have received a response from your provider. ° °Your safety is important to us.  If you have drug allergies check your prescription carefully.   ° °You can use MyChart to ask questions about today’s visit, request a non-urgent call back, or ask for a work or school excuse for 24 hours related to this e-Visit. If it has been greater than 24 hours you will need to follow up with your provider, or enter a new e-Visit to address those concerns. ° ° °You will get an e-mail in the next two days asking about your experience.  I hope that your e-visit has been valuable and will speed your recovery. Thank you for using e-visits. ° ° °**Please do not respond to this message unless you have follow up questions.** °Greater than 5 but less  than 10 minutes spent researching, coordinating, and implementing care for this patient today ° °

## 2019-11-08 ENCOUNTER — Telehealth: Payer: Managed Care, Other (non HMO) | Admitting: Nurse Practitioner

## 2019-11-08 DIAGNOSIS — K591 Functional diarrhea: Secondary | ICD-10-CM

## 2019-11-08 DIAGNOSIS — R12 Heartburn: Secondary | ICD-10-CM

## 2019-11-08 MED ORDER — OMEPRAZOLE 20 MG PO CPDR: 20.0000 mg | DELAYED_RELEASE_CAPSULE | Freq: Every day | ORAL | 3 refills | Status: DC

## 2019-11-08 NOTE — Progress Notes (Signed)
We are sorry that you are not feeling well.  Here is how we plan to help!  Based on what you shared with me it looks like you most likely have Gastroesophageal Reflux Disease (GERD)  Gastroesophageal reflux disease (GERD) happens when acid from your stomach flows up into the esophagus.  When acid comes in contact with the esophagus, the acid causes sorenss (inflammation) in the esophagus.  Over time, GERD may create small holes (ulcers) in the lining of the esophagus.  I have prescribed Omeprazole 20 mg one by mouth daily until you follow up with a provider.  For your diarrhea you can use imodium AD over the counter.  Your symptoms should improve in the next day or two.  You can use antacids as needed until symptoms resolve.  Call us if your heartburn worsens, you have trouble swallowing, weight loss, spitting up blood or recurrent vomiting.  Home Care:  May include lifestyle changes such as weight loss, quitting smoking and alcohol consumption  Avoid foods and drinks that make your symptoms worse, such as:  Caffeine or alcoholic drinks  Chocolate  Peppermint or mint flavorings  Garlic and onions  Spicy foods  Citrus fruits, such as oranges, lemons, or limes  Tomato-based foods such as sauce, chili, salsa and pizza  Fried and fatty foods  Avoid lying down for 3 hours prior to your bedtime or prior to taking a nap  Eat small, frequent meals instead of a large meals  Wear loose-fitting clothing.  Do not wear anything tight around your waist that causes pressure on your stomach.  Raise the head of your bed 6 to 8 inches with wood blocks to help you sleep.  Extra pillows will not help.  Seek Help Right Away If:  You have pain in your arms, neck, jaw, teeth or back  Your pain increases or changes in intensity or duration  You develop nausea, vomiting or sweating (diaphoresis)  You develop shortness of breath or you faint  Your vomit is green, yellow, black or looks like  coffee grounds or blood  Your stool is red, bloody or black  These symptoms could be signs of other problems, such as heart disease, gastric bleeding or esophageal bleeding.  Make sure you :  Understand these instructions.  Will watch your condition.  Will get help right away if you are not doing well or get worse.  Your e-visit answers were reviewed by a board certified advanced clinical practitioner to complete your personal care plan.  Depending on the condition, your plan could have included both over the counter or prescription medications.  If there is a problem please reply  once you have received a response from your provider.  Your safety is important to [place].  If you have drug allergies check your prescription carefully.    You can use MyChart to ask questions about today's visit, request a non-urgent call back, or ask for a work or school excuse for 24 hours related to this e-Visit. If it has been greater than 24 hours you will need to follow up with your provider, or enter a new e-Visit to address those concerns.  You will get an e-mail in the next two days asking about your experience.  I hope that your e-visit has been valuable and will speed your recovery. Thank you for using e-visits.   5-10 minutes spent reviewing and documenting in chart.

## 2019-11-16 ENCOUNTER — Telehealth: Payer: Managed Care, Other (non HMO) | Admitting: Physician Assistant

## 2019-11-16 DIAGNOSIS — N898 Other specified noninflammatory disorders of vagina: Secondary | ICD-10-CM

## 2019-11-16 DIAGNOSIS — R197 Diarrhea, unspecified: Secondary | ICD-10-CM

## 2019-11-16 DIAGNOSIS — R300 Dysuria: Secondary | ICD-10-CM

## 2019-11-16 NOTE — Progress Notes (Signed)
Based on what you shared with me, I feel your condition warrants further evaluation and I recommend that you be seen for a face to face office visit.  Given your recurrent UTIs, persistent diarrhea and vaginal discharge you need a more thorough assessment, urinalysis and urine culture to determine the cause of your symptoms.   NOTE: If you entered your credit card information for this eVisit, you will not be charged. You may see a "hold" on your card for the $35 but that hold will drop off and you will not have a charge processed.   If you are having a true medical emergency please call 911.      For an urgent face to face visit, [facility] has five urgent care centers for your convenience:      NEW:  [facility] at [facility] Directions [phone] [address] [place], [place] [address] . 10 am - 6pm Monday - Friday    [facility]) Get Driving Directions [phone] [address] [place], [place] [address] . 10 am to 8 pm Monday-Friday . 12 pm to 8 pm [facility] Urgent Care at [facility] Get Driving Directions [phone] [address] [place], [place] [address] . 8 am to 8 pm Monday-Friday . 9 am to 6 pm Saturday . 11 am to 6 pm Sunday     [facility] at [facility] Get Driving Directions  [phone] [address] [place], [place] [address] . 8 am to 8 pm Monday-Friday . 8 am to 4 pm [facility] Urgent Care at [facility] Directions [phone] [address] [place], [place] [address] . 12 pm to 6 pm Monday-Friday      Your e-visit answers were reviewed by a board certified advanced clinical practitioner to complete your personal care plan.  Thank you for using e-Visits.  Greater than 5 minutes, yet less than 10 minutes of time have been spent researching, coordinating,  and implementing care for this patient today

## 2019-11-21 ENCOUNTER — Telehealth: Payer: Managed Care, Other (non HMO) | Admitting: Physician Assistant

## 2019-11-21 ENCOUNTER — Other Ambulatory Visit: Payer: Self-pay

## 2019-11-21 ENCOUNTER — Other Ambulatory Visit: Payer: Managed Care, Other (non HMO)

## 2019-11-21 DIAGNOSIS — Z20822 Contact with and (suspected) exposure to covid-19: Secondary | ICD-10-CM

## 2019-11-21 DIAGNOSIS — N3000 Acute cystitis without hematuria: Secondary | ICD-10-CM

## 2019-11-21 MED ORDER — NITROFURANTOIN MONOHYD MACRO 100 MG PO CAPS: 100.0000 mg | ORAL_CAPSULE | Freq: Two times a day (BID) | ORAL | 0 refills | Status: AC

## 2019-11-21 NOTE — Progress Notes (Signed)
We are sorry that you are not feeling well.  Here is how we plan to help!  Based on what you shared with me it looks like you most likely have a simple urinary tract infection.  A UTI (Urinary Tract Infection) is a bacterial infection of the bladder.  Most cases of urinary tract infections are simple to treat but a key part of your care is to encourage you to drink plenty of fluids and watch your symptoms carefully.  I have prescribed MacroBid 100 mg twice a day for 5 days.  Your symptoms should gradually improve. Call us if the burning in your urine worsens, you develop worsening fever, back pain or pelvic pain or if your symptoms do not resolve after completing the antibiotic.  Urinary tract infections can be prevented by drinking plenty of water to keep your body hydrated.  Also be sure when you wipe, wipe from front to back and don't hold it in!  If possible, empty your bladder every 4 hours.  Your e-visit answers were reviewed by a board certified advanced clinical practitioner to complete your personal care plan.  Depending on the condition, your plan could have included both over the counter or prescription medications.  If there is a problem please reply  once you have received a response from your provider.  Your safety is important to [place].  If you have drug allergies check your prescription carefully.    You can use MyChart to ask questions about todays visit, request a non-urgent call back, or ask for a work or school excuse for 24 hours related to this e-Visit. If it has been greater than 24 hours you will need to follow up with your provider, or enter a new e-Visit to address those concerns.   You will get an e-mail in the next two days asking about your experience.  I hope that your e-visit has been valuable and will speed your recovery. Thank you for using e-visits. 5 minutes on chart

## 2019-11-22 LAB — NOVEL CORONAVIRUS, NAA: SARS-CoV-2, NAA: NOT DETECTED

## 2019-12-06 ENCOUNTER — Telehealth: Payer: Managed Care, Other (non HMO) | Admitting: Nurse Practitioner

## 2019-12-06 DIAGNOSIS — B9789 Other viral agents as the cause of diseases classified elsewhere: Secondary | ICD-10-CM

## 2019-12-06 DIAGNOSIS — J329 Chronic sinusitis, unspecified: Secondary | ICD-10-CM | POA: Diagnosis not present

## 2019-12-06 MED ORDER — FLUTICASONE PROPIONATE 50 MCG/ACT NA SUSP: 2.0000 | Freq: Every day | NASAL | 6 refills | Status: DC

## 2019-12-06 NOTE — Progress Notes (Signed)

## 2019-12-12 ENCOUNTER — Encounter: Payer: Self-pay | Admitting: Family

## 2019-12-13 ENCOUNTER — Other Ambulatory Visit: Payer: Self-pay | Admitting: Family

## 2019-12-15 ENCOUNTER — Other Ambulatory Visit: Payer: Self-pay | Admitting: Family

## 2019-12-18 ENCOUNTER — Encounter: Payer: Self-pay | Admitting: Nurse Practitioner

## 2019-12-18 ENCOUNTER — Other Ambulatory Visit: Payer: Self-pay

## 2019-12-18 ENCOUNTER — Telehealth (INDEPENDENT_AMBULATORY_CARE_PROVIDER_SITE_OTHER): Payer: Managed Care, Other (non HMO) | Admitting: Nurse Practitioner

## 2019-12-18 VITALS — Ht 67.0 in | Wt 210.0 lb

## 2019-12-18 DIAGNOSIS — F419 Anxiety disorder, unspecified: Secondary | ICD-10-CM

## 2019-12-18 MED ORDER — HYDROXYZINE HCL 10 MG PO TABS: ORAL_TABLET | ORAL | 0 refills | Status: DC

## 2019-12-18 NOTE — Patient Instructions (Addendum)
Try hydroxyzine 10 mg and take  30 to 60 minutes before the dentist and plane ride.  You will need a driver as it will likely make you drowsy. You may take a dose at bedtime to see how it works.  Consider referral to behavioral therapy for anxiety management.  If your anxiety is present most days, consider taking any medication such as sertraline to manage it to keep you from having ups and downs.  Please follow-up with Claris Che as planned and discuss this further.

## 2019-12-20 ENCOUNTER — Encounter: Payer: Self-pay | Admitting: Nurse Practitioner

## 2019-12-20 NOTE — Progress Notes (Signed)
Virtual Visit via Video Note  This visit type was conducted due to national recommendations for restrictions regarding the COVID-19 pandemic (e.g. social distancing).  This format is felt to be most appropriate for this patient at this time.  All issues noted in this document were discussed and addressed.  No physical exam was performed (except for noted visual exam findings with Video Visits).   I connected with@ on 12/20/19 at  4:00 PM EDT by a video enabled telemedicine application or telephone and verified that I am speaking with the correct person using two identifiers. Location patient: home Location provider: work or home office Persons participating in the virtual visit: patient, provider  I discussed the limitations, risks, security and privacy concerns of performing an evaluation and management service by telephone and the availability of in person appointments. I also discussed with the patient that there may be a patient responsible charge related to this service. The patient expressed understanding and agreed to proceed.   Reason for visit: Patient made this appointment to discuss anxiety medication before a plane trip.  HPI: This healthy 29 year old patient has a history of  low level anxiety that she has managed on her own. Over the last 3 weeks, her anxiety levels have been very high.  She has severe anxiety regarding going to the dentist and she has an upcoming dentist appointment. She has never been on an airplane before and is flying to [place] next Wed. She is feeling anxiety about that. She has never talked to a therapist about anxiety or sought psychiatric care.  She does not take any medication for anxiety, but would now like to take something as needed.  ROS: See pertinent positives and negatives per HPI.  Past Medical History:  Diagnosis Date  . Concussion    high school    No past surgical history on file.  Family History  Problem Relation Age of Onset  .  Diabetes Mother   . Hyperlipidemia Mother   . Hypertension Father   . Gout Father   . Heart disease Maternal Grandfather   . Ovarian cancer Paternal Grandmother     SOCIAL HX: No tobacco, alcohol, illicit drug use.   Current Outpatient Medications:  .  cetirizine (ZYRTEC ALLERGY) 10 MG tablet, Take 10 mg by mouth daily., Disp: , Rfl:  .  ipratropium (ATROVENT) 0.03 % nasal spray, Place 2 sprays into both nostrils 3 (three) times daily as needed for rhinitis., Disp: 30 mL, Rfl: 0 .  omeprazole (PRILOSEC) 20 MG capsule, Take 1 capsule (20 mg total) by mouth daily., Disp: 30 capsule, Rfl: 3 .  dextromethorphan-guaiFENesin (MUCINEX DM) 30-600 MG 12hr tablet, Take 1 tablet by mouth 2 (two) times daily as needed. (Patient not taking: Reported on 12/18/2019), Disp: 30 tablet, Rfl: 0 .  fluticasone (FLONASE) 50 MCG/ACT nasal spray, Place 2 sprays into both nostrils daily. (Patient not taking: Reported on 12/18/2019), Disp: 16 g, Rfl: 6 .  hydrOXYzine (ATARAX/VISTARIL) 10 MG tablet, Take  one tablet 30-60 minutes prior to dentist and plane ride as needed  You will need a driver as it will likely make you drowsy. You may take a dose at bedtime to see how it works., Disp: 15 tablet, Rfl: 0 .  Iron, Ferrous Sulfate, 325 (65 Fe) MG TABS, Take 1 tablet by mouth daily. (Patient not taking: Reported on 12/18/2019), Disp: 30 tablet, Rfl: 3 .  Probiotic Product (PROBIOTIC-10 PO), Take by mouth. (Patient not taking: Reported on 12/18/2019), Disp: , Rfl:  EXAM:  VITALS per patient if applicable:  GENERAL: alert, oriented, appears well and in no acute distress  HEENT: atraumatic, conjunctiva clear, no obvious abnormalities on inspection of external nose and ears  NECK: normal movements of the head and neck  LUNGS: on inspection no signs of respiratory distress, breathing rate appears normal, no obvious gross SOB, gasping or wheezing  CV: no obvious cyanosis  MS: moves all visible extremities without  noticeable abnormality  PSYCH/NEURO: pleasant and cooperative, no obvious depression or anxiety, speech and thought processing grossly intact  ASSESSMENT AND PLAN:  Discussed the following assessment and plan:  Anxiety  No problem-specific Assessment & Plan notes found for this encounter.  We discussed her anxiety disorder in general.  She does have mild anxiety.  PHQ-9 score was 0.  She reports she has been able to manage her anxiety well except for the last 3 weeks and blames this on going to the dentist and a new experience plane ride.  She would prefer to take something just as needed.  We discussed low-dose sertraline and she does not want to take medication daily.  We discussed behavioral therapy consult and she will consider.  She has follow-up appointment already arranged with her primary care provider and was advised to discuss this further with Claris Che.  I discussed the assessment and treatment plan with the patient. The patient was provided an opportunity to ask questions and all were answered. The patient agreed with the plan and demonstrated an understanding of the instructions.   The patient was advised to call back or seek an in-person evaluation if the symptoms worsen or if the condition fails to improve as anticipated.  Amedeo Kinsman, NP Adult Nurse Practitioner [facility] [employer] [phone]

## 2020-01-08 ENCOUNTER — Ambulatory Visit (INDEPENDENT_AMBULATORY_CARE_PROVIDER_SITE_OTHER): Payer: Managed Care, Other (non HMO) | Admitting: Family

## 2020-01-08 ENCOUNTER — Other Ambulatory Visit: Payer: Self-pay

## 2020-01-08 ENCOUNTER — Encounter: Payer: Self-pay | Admitting: Family

## 2020-01-08 VITALS — BP 116/78 | HR 85 | Temp 98.3°F | Ht 67.25 in | Wt 209.8 lb

## 2020-01-08 DIAGNOSIS — Z0000 Encounter for general adult medical examination without abnormal findings: Secondary | ICD-10-CM

## 2020-01-08 DIAGNOSIS — F419 Anxiety disorder, unspecified: Secondary | ICD-10-CM

## 2020-01-08 DIAGNOSIS — R197 Diarrhea, unspecified: Secondary | ICD-10-CM

## 2020-01-08 DIAGNOSIS — F32A Depression, unspecified: Secondary | ICD-10-CM

## 2020-01-08 DIAGNOSIS — K219 Gastro-esophageal reflux disease without esophagitis: Secondary | ICD-10-CM | POA: Diagnosis not present

## 2020-01-08 LAB — LIPID PANEL
Cholesterol: 183 mg/dL (ref 0–200)
HDL: 45.9 mg/dL (ref >39.00)
LDL Cholesterol: 112 mg/dL — ABNORMAL HIGH (ref 0–99)
NonHDL: 136.95
Total CHOL/HDL Ratio: 4
Triglycerides: 126 mg/dL (ref 0.0–149.0)
VLDL: 25.2 mg/dL (ref 0.0–40.0)

## 2020-01-08 LAB — COMPREHENSIVE METABOLIC PANEL
ALT: 16 U/L (ref 0–35)
AST: 13 U/L (ref 0–37)
Albumin: 4.5 g/dL (ref 3.5–5.2)
Alkaline Phosphatase: 58 U/L (ref 39–117)
BUN: 13 mg/dL (ref 6–23)
CO2: 26 mEq/L (ref 19–32)
Calcium: 9.4 mg/dL (ref 8.4–10.5)
Chloride: 102 mEq/L (ref 96–112)
Creatinine, Ser: 0.78 mg/dL (ref 0.40–1.20)
GFR: 104.38 mL/min (ref >60.00)
Glucose, Bld: 79 mg/dL (ref 70–99)
Potassium: 3.9 mEq/L (ref 3.5–5.1)
Sodium: 138 mEq/L (ref 135–145)
Total Bilirubin: 0.4 mg/dL (ref 0.2–1.2)
Total Protein: 6.9 g/dL (ref 6.0–8.3)

## 2020-01-08 LAB — CBC WITH DIFFERENTIAL/PLATELET
Basophils Absolute: 0.1 10*3/uL (ref 0.0–0.1)
Basophils Relative: 1.1 % (ref 0.0–3.0)
Eosinophils Absolute: 0.1 10*3/uL (ref 0.0–0.7)
Eosinophils Relative: 1.2 % (ref 0.0–5.0)
HCT: 38.7 % (ref 36.0–46.0)
Hemoglobin: 13.2 g/dL (ref 12.0–15.0)
Lymphocytes Relative: 23.2 % (ref 12.0–46.0)
Lymphs Abs: 1.2 10*3/uL (ref 0.7–4.0)
MCHC: 34 g/dL (ref 30.0–36.0)
MCV: 93.3 fl (ref 78.0–100.0)
Monocytes Absolute: 0.5 10*3/uL (ref 0.1–1.0)
Monocytes Relative: 9.4 % (ref 3.0–12.0)
Neutro Abs: 3.3 10*3/uL (ref 1.4–7.7)
Neutrophils Relative %: 65.1 % (ref 43.0–77.0)
Platelets: 252 10*3/uL (ref 150.0–400.0)
RBC: 4.15 Mil/uL (ref 3.87–5.11)
RDW: 12.7 % (ref 11.5–15.5)
WBC: 5.1 10*3/uL (ref 4.0–10.5)

## 2020-01-08 LAB — TSH: TSH: 2.26 u[IU]/mL (ref 0.35–4.50)

## 2020-01-08 LAB — IBC + FERRITIN
Ferritin: 13.9 ng/mL (ref 10.0–291.0)
Iron: 133 ug/dL (ref 42–145)
Saturation Ratios: 30.3 % (ref 20.0–50.0)
Transferrin: 314 mg/dL (ref 212.0–360.0)

## 2020-01-08 LAB — HEMOGLOBIN A1C: Hgb A1c MFr Bld: 5.3 % (ref 4.6–6.5)

## 2020-01-08 LAB — VITAMIN D 25 HYDROXY (VIT D DEFICIENCY, FRACTURES): VITD: 18.77 ng/mL — ABNORMAL LOW (ref 30.00–100.00)

## 2020-01-08 MED ORDER — PANTOPRAZOLE SODIUM 40 MG PO TBEC: 40.0000 mg | DELAYED_RELEASE_TABLET | Freq: Every day | ORAL | 3 refills | Status: DC

## 2020-01-08 MED ORDER — HYDROXYZINE HCL 10 MG PO TABS: ORAL_TABLET | ORAL | 1 refills | Status: DC

## 2020-01-08 NOTE — Assessment & Plan Note (Signed)
cbe performed today. Deferred pelvic exam as pap UTD and she follows with GYN. Encouraged continued exercise.

## 2020-01-08 NOTE — Assessment & Plan Note (Signed)
Uncontrolled. Advised to trial zoloft. She felt most comfortable with using PRN medication, atarax , and I have advised she may continue this at 10mg  qhs.

## 2020-01-08 NOTE — Assessment & Plan Note (Signed)
Chronic. Pending lab evaluation. Benign exam today. Due chronicity, I have advised GI consult which I placed today.

## 2020-01-08 NOTE — Progress Notes (Signed)
Subjective:    Patient ID: Melissa Walton, female    DOB: 1990-06-08, 29 y.o.   MRN: [id number]  CC: Melissa Walton is a 29 y.o. female who presents today for physical exam and follow up.    HPI:   Complains of depression and anxiety.'ive been like this years.' Improved from a couple of years ago.   reports feeling down. She also reports trouble staying and falling asleep which ' comes in waves' and related to anxiety. Anxiety is more bothersome than depression.Working on Scientist, [employer] in psychology , plans to graduate in one year.    She has never been hospitalized for this.  No si/hi.  Recently prescribed atarax for prior to dentist appoinment and plane ride with relief of anxiety.  Complains of chronic bloating , gas for 2 years, comes and goes.  She is lactose intolerant and avoids all dairy. She has intermittent brown watery to loose stools; this occurs 1-2 twice per week. No identifiable trigger.  Endorses epigastric burning.  Taking omeprazole prn with relief. Not sure if food trigger. Avoids eating a meal and laying down. No blood in stool. No further emesis with BRB.  Weight is stable.  Upper endoscopy 01/2019 due to GIB.   No longer on iron. No NSAIDs.   Seen my Joyice Faster 01/04/20 .prescribed pantroprozole 40mg  BID.   Colorectal Cancer Screening: no family h/o colon cancer. Breast Cancer Screening: no family h/o colon cancer Cervical Cancer Screening: UTD, per 11/2017; follows with 12/2017 clinic and plans to make an appointment.   No family history of AAA.        Tetanus - utd    Hepatitis C screening - Candidate for, consents HIV Screening- Candidate for , consents Labs: Screening labs today. Exercise: Gets regular exercise.   Alcohol use:  none Smoking/tobacco use: Nonsmoker.     HISTORY:  Past Medical History:  Diagnosis Date  . Concussion    high school    History reviewed. No pertinent surgical history. Family History  Problem Relation  Age of Onset  . Diabetes Mother   . Hyperlipidemia Mother   . Hypertension Father   . Gout Father   . Heart disease Maternal Grandfather   . Ovarian cancer Paternal Grandmother       ALLERGIES: Loracarbef and Penicillins  Current Outpatient Medications on File Prior to Visit  Medication Sig Dispense Refill  . ipratropium (ATROVENT) 0.03 % nasal spray Place 2 sprays into both nostrils 3 (three) times daily as needed for rhinitis. 30 mL 0  . [DISCONTINUED] omeprazole (PRILOSEC OTC) 20 MG tablet Take 1 tablet (20 mg total) by mouth daily. 30 tablet 6   No current facility-administered medications on file prior to visit.    Social History   Tobacco Use  . Smoking status: Never Smoker  . Smokeless tobacco: Never Used  Substance Use Topics  . Alcohol use: No  . Drug use: No    Review of Systems  Constitutional: Negative for chills, fever and unexpected weight change.  HENT: Negative for congestion.   Respiratory: Negative for cough and shortness of breath.   Cardiovascular: Negative for chest pain and palpitations.  Gastrointestinal: Positive for abdominal distention and diarrhea. Negative for abdominal pain, anal bleeding, blood in stool, constipation, nausea and vomiting.  Musculoskeletal: Negative for arthralgias and myalgias.  Skin: Negative for rash.  Neurological: Negative for headaches.  Hematological: Negative for adenopathy.  Psychiatric/Behavioral: Positive for sleep disturbance. Negative for confusion and suicidal ideas. The patient  is nervous/anxious.       Objective:    BP 116/78   Pulse 85   Temp 98.3 F (36.8 C)   Ht 5' 7.25" (1.708 m)   Wt 209 lb 12.8 oz (95.2 kg)   SpO2 99%   BMI 32.62 kg/m   BP Readings from Last 3 Encounters:  01/08/20 116/78  02/08/19 124/77  01/06/19 116/76   Wt Readings from Last 3 Encounters:  01/08/20 209 lb 12.8 oz (95.2 kg)  12/18/19 210 lb (95.3 kg)  02/08/19 196 lb (88.9 kg)    Physical Exam Vitals reviewed.    Constitutional:      Appearance: Normal appearance. She is well-developed.  Eyes:     Conjunctiva/sclera: Conjunctivae normal.  Neck:     Thyroid: No thyroid mass or thyromegaly.  Cardiovascular:     Rate and Rhythm: Normal rate and regular rhythm.     Pulses: Normal pulses.     Heart sounds: Normal heart sounds.  Pulmonary:     Effort: Pulmonary effort is normal.     Breath sounds: Normal breath sounds. No wheezing, rhonchi or rales.  Chest:     Breasts: Breasts are symmetrical.        Right: No inverted nipple, mass, nipple discharge, skin change or tenderness.        Left: No inverted nipple, mass, nipple discharge, skin change or tenderness.  Abdominal:     General: Bowel sounds are normal. There is no distension.     Palpations: Abdomen is soft. Abdomen is not rigid. There is no fluid wave or mass.     Tenderness: There is no abdominal tenderness. There is no guarding or rebound.  Lymphadenopathy:     Head:     Right side of head: No submental, submandibular, tonsillar, preauricular, posterior auricular or occipital adenopathy.     Left side of head: No submental, submandibular, tonsillar, preauricular, posterior auricular or occipital adenopathy.     Cervical: No cervical adenopathy.     Right cervical: No superficial, deep or posterior cervical adenopathy.    Left cervical: No superficial, deep or posterior cervical adenopathy.  Skin:    General: Skin is warm and dry.  Neurological:     Mental Status: She is alert.  Psychiatric:        Speech: Speech normal.        Behavior: Behavior normal.        Thought Content: Thought content normal.        Assessment & Plan:   Problem List Items Addressed This Visit      Digestive   GERD (gastroesophageal reflux disease)    Uncontrolled. 6-8 week course of protonix 40mg  QD as she had been on 40mg  BID after GIB. Close follow up.       Relevant Medications   pantoprazole (PROTONIX) 40 MG tablet   Other Relevant Orders    CBC with Differential/Platelet (Completed)   IBC + Ferritin (Completed)   Celiac Disease Panel   H. pylori breath test     Other   Anxiety and depression    Uncontrolled. Advised to trial zoloft. She felt most comfortable with using PRN medication, atarax , and I have advised she may continue this at 10mg  qhs.       Relevant Medications   hydrOXYzine (ATARAX/VISTARIL) 10 MG tablet   Diarrhea    Chronic. Pending lab evaluation. Benign exam today. Due chronicity, I have advised GI consult which I placed today.  Relevant Orders   Ambulatory referral to Gastroenterology   IBC + Ferritin (Completed)   Celiac Disease Panel   H. pylori breath test   Routine general medical examination at a health care facility - Primary    cbe performed today. Deferred pelvic exam as pap UTD and she follows with GYN. Encouraged continued exercise.       Relevant Medications   hydrOXYzine (ATARAX/VISTARIL) 10 MG tablet   Other Relevant Orders   TSH (Completed)   CBC with Differential/Platelet (Completed)   Comprehensive metabolic panel (Completed)   Hemoglobin A1c (Completed)   Hepatitis C antibody   HIV Antibody (routine testing w rflx)   Lipid panel (Completed)   VITAMIN D 25 Hydroxy (Vit-D Deficiency, Fractures) (Completed)   IBC + Ferritin (Completed)   Celiac Disease Panel       I have discontinued Qianna L. Colledge's Probiotic Product (PROBIOTIC-10 PO), Iron (Ferrous Sulfate), cetirizine, dextromethorphan-guaiFENesin, omeprazole, and fluticasone. I am also having her start on pantoprazole. Additionally, I am having her maintain her ipratropium and hydrOXYzine.   Meds ordered this encounter  Medications  . hydrOXYzine (ATARAX/VISTARIL) 10 MG tablet    Sig: Take  one tablet 30-60 minutes prior to dentist and plane ride as needed  You will need a driver as it will likely make you drowsy. You may take a dose at bedtime to see how it works.    Dispense:  30 tablet    Refill:  1     Order Specific Question:   Supervising Provider    Answer:   Duncan Dull L [[id number]]  . pantoprazole (PROTONIX) 40 MG tablet    Sig: Take 1 tablet (40 mg total) by mouth daily.    Dispense:  30 tablet    Refill:  3    Order Specific Question:   Supervising Provider    Answer:   Sherlene Shams [[id number]]    Return precautions given.   Risks, benefits, and alternatives of the medications and treatment plan prescribed today were discussed, and patient expressed understanding.   Education regarding symptom management and diagnosis given to patient on AVS.   Continue to follow with [name], FNP for routine health maintenance.   Melissa Walton and I agreed with plan.   Rennie Plowman, FNP

## 2020-01-08 NOTE — Assessment & Plan Note (Signed)
Uncontrolled. 6-8 week course of protonix 40mg  QD as she had been on 40mg  BID after GIB. Close follow up.

## 2020-01-08 NOTE — Patient Instructions (Signed)
Pantoprazole for 6-8 weeks until follow up. Then we will WEAN you off.   Use atarax as needed to help sleep at night. Let me know how you are doing.  Referral to GI Let us know if you dont hear back within a week in regards to an appointment being scheduled.    Health Maintenance, Female Adopting a healthy lifestyle and getting preventive care are important in promoting health and wellness. Ask your health care provider about:  The right schedule for you to have regular tests and exams.  Things you can do on your own to prevent diseases and keep yourself healthy. What should I know about diet, weight, and exercise? Eat a healthy diet   Eat a diet that includes plenty of vegetables, fruits, low-fat dairy products, and lean protein.  Do not eat a lot of foods that are high in solid fats, added sugars, or sodium. Maintain a healthy weight Body mass index (BMI) is used to identify weight problems. It estimates body fat based on height and weight. Your health care provider can help determine your BMI and help you achieve or maintain a healthy weight. Get regular exercise Get regular exercise. This is one of the most important things you can do for your health. Most adults should:  Exercise for at least 150 minutes each week. The exercise should increase your heart rate and make you sweat (moderate-intensity exercise).  Do strengthening exercises at least twice a week. This is in addition to the moderate-intensity exercise.  Spend less time sitting. Even light physical activity can be beneficial. Watch cholesterol and blood lipids Have your blood tested for lipids and cholesterol at 27 years of age, then have this test every 5 years. Have your cholesterol levels checked more often if:  Your lipid or cholesterol levels are high.  You are older than 27 years of age.  You are at high risk for heart disease. What should I know about cancer screening? Depending on your health history  and family history, you may need to have cancer screening at various ages. This may include screening for:  Breast cancer.  Cervical cancer.  Colorectal cancer.  Skin cancer.  Lung cancer. What should I know about heart disease, diabetes, and high blood pressure? Blood pressure and heart disease  High blood pressure causes heart disease and increases the risk of stroke. This is more likely to develop in people who have high blood pressure readings, are of African descent, or are overweight.  Have your blood pressure checked: ? Every 3-5 years if you are 25-72 years of age. ? Every year if you are 71 years old or older. Diabetes Have regular diabetes screenings. This checks your fasting blood sugar level. Have the screening done:  Once every three years after age 28 if you are at a normal weight and have a low risk for diabetes.  More often and at a younger age if you are overweight or have a high risk for diabetes. What should I know about preventing infection? Hepatitis B If you have a higher risk for hepatitis B, you should be screened for this virus. Talk with your health care provider to find out if you are at risk for hepatitis B infection. Hepatitis C Testing is recommended for:  Everyone born from [birth year] through 1963.  Anyone with known risk factors for hepatitis C. Sexually transmitted infections (STIs)  Get screened for STIs, including gonorrhea and chlamydia, if: ? You are sexually active and are younger than 27  years of age. ? You are older than 27 years of age and your health care provider tells you that you are at risk for this type of infection. ? Your sexual activity has changed since you were last screened, and you are at increased risk for chlamydia or gonorrhea. Ask your health care provider if you are at risk.  Ask your health care provider about whether you are at high risk for HIV. Your health care provider may recommend a prescription medicine to help  prevent HIV infection. If you choose to take medicine to prevent HIV, you should first get tested for HIV. You should then be tested every 3 months for as long as you are taking the medicine. Pregnancy  If you are about to stop having your period (premenopausal) and you may become pregnant, seek counseling before you get pregnant.  Take 400 to 800 micrograms (mcg) of folic acid every day if you become pregnant.  Ask for birth control (contraception) if you want to prevent pregnancy. Osteoporosis and menopause Osteoporosis is a disease in which the bones lose minerals and strength with aging. This can result in bone fractures. If you are 58 years old or older, or if you are at risk for osteoporosis and fractures, ask your health care provider if you should:  Be screened for bone loss.  Take a calcium or vitamin D supplement to lower your risk of fractures.  Be given hormone replacement therapy (HRT) to treat symptoms of menopause. Follow these instructions at home: Lifestyle  Do not use any products that contain nicotine or tobacco, such as cigarettes, e-cigarettes, and chewing tobacco. If you need help quitting, ask your health care provider.  Do not use street drugs.  Do not share needles.  Ask your health care provider for help if you need support or information about quitting drugs. Alcohol use  Do not drink alcohol if: ? Your health care provider tells you not to drink. ? You are pregnant, may be pregnant, or are planning to become pregnant.  If you drink alcohol: ? Limit how much you use to 0-1 drink a day. ? Limit intake if you are breastfeeding.  Be aware of how much alcohol is in your drink. In the [place] one drink equals one 12 oz bottle of beer (355 mL), one 5 oz glass of wine (148 mL), or one 1 oz glass of hard liquor (44 mL). General instructions  Schedule regular health, dental, and eye exams.  Stay current with your vaccines.  Tell your health care provider  if: ? You often feel depressed. ? You have ever been abused or do not feel safe at home. Summary  Adopting a healthy lifestyle and getting preventive care are important in promoting health and wellness.  Follow your health care provider's instructions about healthy diet, exercising, and getting tested or screened for diseases.  Follow your health care provider's instructions on monitoring your cholesterol and blood pressure. This information is not intended to replace advice given to you by your health care provider. Make sure you discuss any questions you have with your health care provider. Document Revised: 02/23/2018 Document Reviewed: 02/23/2018 Elsevier Patient Education  2020 [employer].

## 2020-01-09 LAB — H. PYLORI BREATH TEST: H. pylori Breath Test: NOT DETECTED

## 2020-01-09 LAB — CELIAC DISEASE PANEL
(tTG) Ab, IgA: <1 U/mL
(tTG) Ab, IgG: <1 U/mL
Gliadin IgA: <1 U/mL
Gliadin IgG: <1 U/mL
Immunoglobulin A: 156 mg/dL (ref 47–310)

## 2020-01-09 LAB — HIV ANTIBODY (ROUTINE TESTING W REFLEX): HIV 1&2 Ab, 4th Generation: NONREACTIVE

## 2020-01-09 LAB — HEPATITIS C ANTIBODY
Hepatitis C Ab: NONREACTIVE
SIGNAL TO CUT-OFF: 0.01 (ref <1.00)

## 2020-02-06 ENCOUNTER — Telehealth: Payer: Managed Care, Other (non HMO) | Admitting: Nurse Practitioner

## 2020-02-06 DIAGNOSIS — B9689 Other specified bacterial agents as the cause of diseases classified elsewhere: Secondary | ICD-10-CM

## 2020-02-06 DIAGNOSIS — N760 Acute vaginitis: Secondary | ICD-10-CM

## 2020-02-06 MED ORDER — METRONIDAZOLE 500 MG PO TABS: 500.0000 mg | ORAL_TABLET | Freq: Two times a day (BID) | ORAL | 0 refills | Status: DC

## 2020-02-06 NOTE — Progress Notes (Signed)

## 2020-02-19 ENCOUNTER — Telehealth: Payer: Managed Care, Other (non HMO) | Admitting: Emergency Medicine

## 2020-02-19 DIAGNOSIS — R8290 Unspecified abnormal findings in urine: Secondary | ICD-10-CM | POA: Diagnosis not present

## 2020-02-19 MED ORDER — NITROFURANTOIN MONOHYD MACRO 100 MG PO CAPS: 100.0000 mg | ORAL_CAPSULE | Freq: Two times a day (BID) | ORAL | 0 refills | Status: DC

## 2020-02-19 NOTE — Progress Notes (Signed)
We are sorry that you are not feeling well.  Here is how we plan to help!  Based on what you shared with me it looks like you most likely have a simple urinary tract infection.  A UTI (Urinary Tract Infection) is a bacterial infection of the bladder.  Most cases of urinary tract infections are simple to treat but a key part of your care is to encourage you to drink plenty of fluids and watch your symptoms carefully.  I have prescribed MacroBid 100 mg twice a day for 5 days.  Your symptoms should gradually improve. Call us if the burning in your urine worsens, you develop worsening fever, back pain or pelvic pain or if your symptoms do not resolve after completing the antibiotic.  Urinary tract infections can be prevented by drinking plenty of water to keep your body hydrated.  Also be sure when you wipe, wipe from front to back and don't hold it in!  If possible, empty your bladder every 4 hours.  Your e-visit answers were reviewed by a board certified advanced clinical practitioner to complete your personal care plan.  Depending on the condition, your plan could have included both over the counter or prescription medications.  If there is a problem please reply  once you have received a response from your provider.  Your safety is important to us.  If you have drug allergies check your prescription carefully.    You can use MyChart to ask questions about today's visit, request a non-urgent call back, or ask for a work or school excuse for 24 hours related to this e-Visit. If it has been greater than 24 hours you will need to follow up with your provider, or enter a new e-Visit to address those concerns.   You will get an e-mail in the next two days asking about your experience.  I hope that your e-visit has been valuable and will speed your recovery. Thank you for using e-visits.   Approximately 5 minutes was used in reviewing the patient's chart, questionnaire, prescribing medications, and  documentation.  

## 2020-02-26 ENCOUNTER — Encounter: Payer: Self-pay | Admitting: Family

## 2020-03-04 ENCOUNTER — Other Ambulatory Visit: Payer: Self-pay | Admitting: Family

## 2020-03-04 DIAGNOSIS — Z0000 Encounter for general adult medical examination without abnormal findings: Secondary | ICD-10-CM

## 2020-04-09 ENCOUNTER — Ambulatory Visit (INDEPENDENT_AMBULATORY_CARE_PROVIDER_SITE_OTHER): Payer: Managed Care, Other (non HMO) | Admitting: Family

## 2020-04-09 ENCOUNTER — Other Ambulatory Visit: Payer: Self-pay

## 2020-04-09 ENCOUNTER — Encounter: Payer: Self-pay | Admitting: Family

## 2020-04-09 VITALS — BP 122/80 | HR 72 | Temp 97.9°F | Wt 218.8 lb

## 2020-04-09 DIAGNOSIS — F419 Anxiety disorder, unspecified: Secondary | ICD-10-CM | POA: Diagnosis not present

## 2020-04-09 DIAGNOSIS — K625 Hemorrhage of anus and rectum: Secondary | ICD-10-CM | POA: Diagnosis not present

## 2020-04-09 DIAGNOSIS — Z8719 Personal history of other diseases of the digestive system: Secondary | ICD-10-CM

## 2020-04-09 DIAGNOSIS — K219 Gastro-esophageal reflux disease without esophagitis: Secondary | ICD-10-CM | POA: Diagnosis not present

## 2020-04-09 DIAGNOSIS — F32A Depression, unspecified: Secondary | ICD-10-CM

## 2020-04-09 MED ORDER — SERTRALINE HCL 50 MG PO TABS: 50.0000 mg | ORAL_TABLET | Freq: Every day | ORAL | 3 refills | Status: DC

## 2020-04-09 NOTE — Assessment & Plan Note (Signed)
Controlled on protonix. Upcoming GI appointment and advised her to discuss long term use of PPIs and risks of medication this week. She verbalized understanding.

## 2020-04-09 NOTE — Assessment & Plan Note (Signed)
Resolved. Upcoming GI appointment in 3 days therefore deferred further evaluation; advised her to discuss symptom with her. She is not anemic. She verbalized understanding.

## 2020-04-09 NOTE — Progress Notes (Signed)
Subjective:    Patient ID: Melissa Walton, female    DOB: 1990/06/02, 30 y.o.   MRN: [id number]  CC: Melissa Walton is a 30 y.o. female who presents today for follow up.   HPI: Anxiety has improved and she is using atarax nightly without grogginess the next day. She continues to have breakthrough anxiety during th day.   Complains of episode of BRB when she wiping while having a bowel movement 6 weeks ago. No recurrence. No weight loss, rectal itching.    She noted BRB Blood mixed in the stool.  Endorses constipation, straining.  Drinks apples juice to regulate  Bowel movements. No h/o hemorrhoids. No nsaids . Rare alcohol use.   GERD- compliant with protonix 40mg  QD with improvement of epigastric burning. No vomiting.  H/o GIB 11/2018 EGD With [facility] 01/27/19- unable to see results.  Never had colonoscopy. Follow up with GI in 4 days.  No anemia.   Last BM today    HISTORY:  Past Medical History:  Diagnosis Date  . Concussion    high school   No past surgical history on file. Family History  Problem Relation Age of Onset  . Diabetes Mother   . Hyperlipidemia Mother   . Hypertension Father   . Gout Father   . Heart disease Maternal Grandfather   . Ovarian cancer Paternal Grandmother     Allergies: Loracarbef and Penicillins Current Outpatient Medications on File Prior to Visit  Medication Sig Dispense Refill  . hydrOXYzine (ATARAX/VISTARIL) 10 MG tablet TAKE 1 TABLET BY MOUTH 30-60 MINUTES PRIOR TO DENTIST& PLANE RIDE AS NEEDED. NEED DRIVER, WILL MAKE DROWSY. MAY TRY AT BEDTIME TO SEE HOW IT WORKS 30 tablet 1  . ipratropium (ATROVENT) 0.03 % nasal spray Place 2 sprays into both nostrils 3 (three) times daily as needed for rhinitis. 30 mL 0  . metroNIDAZOLE (FLAGYL) 500 MG tablet Take 1 tablet (500 mg total) by mouth 2 (two) times daily. 14 tablet 0  . nitrofurantoin, macrocrystal-monohydrate, (MACROBID) 100 MG capsule Take 1 capsule (100 mg total) by mouth 2  (two) times daily. 10 capsule 0  . pantoprazole (PROTONIX) 40 MG tablet Take 1 tablet (40 mg total) by mouth daily. 30 tablet 3  . [DISCONTINUED] omeprazole (PRILOSEC OTC) 20 MG tablet Take 1 tablet (20 mg total) by mouth daily. 30 tablet 6   No current facility-administered medications on file prior to visit.    Social History   Tobacco Use  . Smoking status: Never Smoker  . Smokeless tobacco: Never Used  Substance Use Topics  . Alcohol use: No  . Drug use: No    Review of Systems  Constitutional: Negative for chills and fever.  Respiratory: Negative for cough.   Cardiovascular: Negative for chest pain and palpitations.  Gastrointestinal: Negative for abdominal pain, anal bleeding (resolved), nausea, rectal pain and vomiting.  Psychiatric/Behavioral: Positive for sleep disturbance. The patient is nervous/anxious.       Objective:    BP 122/80   Pulse 72   Temp 97.9 F (36.6 C)   Wt 218 lb 12.8 oz (99.2 kg)   SpO2 99%   BMI 34.01 kg/m  BP Readings from Last 3 Encounters:  04/09/20 122/80  01/08/20 116/78  02/08/19 124/77   Wt Readings from Last 3 Encounters:  04/09/20 218 lb 12.8 oz (99.2 kg)  01/08/20 209 lb 12.8 oz (95.2 kg)  12/18/19 210 lb (95.3 kg)    Physical Exam Vitals reviewed.  Constitutional:  Appearance: She is well-developed and well-nourished.  Eyes:     Conjunctiva/sclera: Conjunctivae normal.  Cardiovascular:     Rate and Rhythm: Normal rate and regular rhythm.     Pulses: Normal pulses.     Heart sounds: Normal heart sounds.  Pulmonary:     Effort: Pulmonary effort is normal.     Breath sounds: Normal breath sounds. No wheezing, rhonchi or rales.  Skin:    General: Skin is warm and dry.  Neurological:     Mental Status: She is alert.  Psychiatric:        Mood and Affect: Mood and affect normal.        Speech: Speech normal.        Behavior: Behavior normal.        Thought Content: Thought content normal.        Assessment &  Plan:   Problem List Items Addressed This Visit      Digestive   GERD (gastroesophageal reflux disease)    Controlled on protonix. Upcoming GI appointment and advised her to discuss long term use of PPIs and risks of medication this week. She verbalized understanding.       Rectal bleeding    Resolved. Upcoming GI appointment in 3 days therefore deferred further evaluation; advised her to discuss symptom with her. She is not anemic. She verbalized understanding.         Other   Anxiety and depression - Primary    Uncontrolled. Start zoloft 50mg . Continue atarax 10mg . Close follow up.      Relevant Medications   sertraline (ZOLOFT) 50 MG tablet   History of GI bleed       I am having Melissa Walton start on sertraline. I am also having her maintain her ipratropium, pantoprazole, metroNIDAZOLE, nitrofurantoin (macrocrystal-monohydrate), and hydrOXYzine.   Meds ordered this encounter  Medications  . sertraline (ZOLOFT) 50 MG tablet    Sig: Take 1 tablet (50 mg total) by mouth at bedtime.    Dispense:  90 tablet    Refill:  3    Order Specific Question:   Supervising Provider    Answer:   [[id number]]    Return precautions given.   Risks, benefits, and alternatives of the medications and treatment plan prescribed today were discussed, and patient expressed understanding.   Education regarding symptom management and diagnosis given to patient on AVS.  Continue to follow with , FNP for routine health maintenance.   Melissa Walton and I agreed with plan.   Melissa Grana, FNP

## 2020-04-09 NOTE — Patient Instructions (Signed)
Please discuss rectal bleeding and use of protonix for acid reflux with Gastroenterology this week as we discussed.   Trial of zoloft  Our hope is for gradual improvement of mood since starting medication; however this may take several weeks.   If you start to have unusual thoughts, thoughts of hurting yourself, or anyone else, please go immediately to the emergency department.   Follow up in 6 weeks.   Please text to [phone] and write the word 'home'. This will put you in touch with trained crisis counselor and resources.    National Suicide Prevention Hotline - available 24 hours a day, 7 days a week.  3601005925   http://NIMH.NIH.Gov">  Generalized Anxiety Disorder, Adult Generalized anxiety disorder (GAD) is a mental health condition. Unlike normal worries, anxiety related to GAD is not triggered by a specific event. These worries do not fade or get better with time. GAD interferes with relationships, work, and school. GAD symptoms can vary from mild to severe. People with severe GAD can have intense waves of anxiety with physical symptoms that are similar to panic attacks. What are the causes? The exact cause of GAD is not known, but the following are believed to have an impact:  Differences in natural brain chemicals.  Genes passed down from parents to children.  Differences in the way threats are perceived.  Development during childhood.  Personality. What increases the risk? The following factors may make you more likely to develop this condition:  Being female.  Having a family history of anxiety disorders.  Being very shy.  Experiencing very stressful life events, such as the death of a loved one.  Having a very stressful family environment. What are the signs or symptoms? People with GAD often worry excessively about many things in their lives, such as their health and family. Symptoms may also include:  Mental and emotional symptoms: ? Worrying  excessively about natural disasters. ? Fear of being late. ? Difficulty concentrating. ? Fears that others are judging your performance.  Physical symptoms: ? Fatigue. ? Headaches, muscle tension, muscle twitches, trembling, or feeling shaky. ? Feeling like your heart is pounding or beating very fast. ? Feeling out of breath or like you cannot take a deep breath. ? Having trouble falling asleep or staying asleep, or experiencing restlessness. ? Sweating. ? Nausea, diarrhea, or irritable bowel syndrome (IBS).  Behavioral symptoms: ? Experiencing erratic moods or irritability. ? Avoidance of new situations. ? Avoidance of people. ? Extreme difficulty making decisions. How is this diagnosed? This condition is diagnosed based on your symptoms and medical history. You will also have a physical exam. Your health care provider may perform tests to rule out other possible causes of your symptoms. To be diagnosed with GAD, a person must have anxiety that:  Is out of his or her control.  Affects several different aspects of his or her life, such as work and relationships.  Causes distress that makes him or her unable to take part in normal activities.  Includes at least three symptoms of GAD, such as restlessness, fatigue, trouble concentrating, irritability, muscle tension, or sleep problems. Before your health care provider can confirm a diagnosis of GAD, these symptoms must be present more days than they are not, and they must last for 6 months or longer. How is this treated? This condition may be treated with:  Medicine. Antidepressant medicine is usually prescribed for long-term daily control. Anti-anxiety medicines may be added in severe cases, especially when panic attacks  occur.  Talk therapy (psychotherapy). Certain types of talk therapy can be helpful in treating GAD by providing support, education, and guidance. Options include: ? Cognitive behavioral therapy (CBT). People learn  coping skills and self-calming techniques to ease their physical symptoms. They learn to identify unrealistic thoughts and behaviors and to replace them with more appropriate thoughts and behaviors. ? Acceptance and commitment therapy (ACT). This treatment teaches people how to be mindful as a way to cope with unwanted thoughts and feelings. ? Biofeedback. This process trains you to manage your body's response (physiological response) through breathing techniques and relaxation methods. You will work with a therapist while machines are used to monitor your physical symptoms.  Stress management techniques. These include yoga, meditation, and exercise. A mental health specialist can help determine which treatment is best for you. Some people see improvement with one type of therapy. However, other people require a combination of therapies.   Follow these instructions at home: Lifestyle  Maintain a consistent routine and schedule.  Anticipate stressful situations. Create a plan, and allow extra time to work with your plan.  Practice stress management or self-calming techniques that you have learned from your therapist or your health care provider. General instructions  Take over-the-counter and prescription medicines only as told by your health care provider.  Understand that you are likely to have setbacks. Accept this and be kind to yourself as you persist to take better care of yourself.  Recognize and accept your accomplishments, even if you judge them as small.  Keep all follow-up visits as told by your health care provider. This is important. Contact a health care provider if:  Your symptoms do not get better.  Your symptoms get worse.  You have signs of depression, such as: ? A persistently sad or irritable mood. ? Loss of enjoyment in activities that used to bring you joy. ? Change in weight or eating. ? Changes in sleeping habits. ? Avoiding friends or family members. ? Loss  of energy for normal tasks. ? Feelings of guilt or worthlessness. Get help right away if:  You have serious thoughts about hurting yourself or others. If you ever feel like you may hurt yourself or others, or have thoughts about taking your own life, get help right away. Go to your nearest emergency department or:  Call your local emergency services (911 in the [place]  Call a suicide crisis helpline, such as the National Suicide Prevention Lifeline at 9890734748. This is open 24 hours a day in the [place]  Text the Crisis Text Line at 949-067-7122 (in the [place] Summary  Generalized anxiety disorder (GAD) is a mental health condition that involves worry that is not triggered by a specific event.  People with GAD often worry excessively about many things in their lives, such as their health and family.  GAD may cause symptoms such as restlessness, trouble concentrating, sleep problems, frequent sweating, nausea, diarrhea, headaches, and trembling or muscle twitching.  A mental health specialist can help determine which treatment is best for you. Some people see improvement with one type of therapy. However, other people require a combination of therapies. This information is not intended to replace advice given to you by your health care provider. Make sure you discuss any questions you have with your health care provider. Document Revised: 12/21/2018 Document Reviewed: 12/21/2018 Elsevier Patient Education  2021 [employer].

## 2020-04-09 NOTE — Assessment & Plan Note (Signed)
Uncontrolled. Start zoloft 50mg . Continue atarax 10mg . Close follow up.

## 2020-04-10 ENCOUNTER — Other Ambulatory Visit: Payer: Self-pay | Admitting: Family

## 2020-04-10 DIAGNOSIS — K219 Gastro-esophageal reflux disease without esophagitis: Secondary | ICD-10-CM

## 2020-04-12 ENCOUNTER — Other Ambulatory Visit: Payer: Self-pay | Admitting: Gastroenterology

## 2020-04-12 DIAGNOSIS — R1013 Epigastric pain: Secondary | ICD-10-CM

## 2020-04-15 ENCOUNTER — Telehealth: Payer: Managed Care, Other (non HMO) | Admitting: Physician Assistant

## 2020-04-15 DIAGNOSIS — B9689 Other specified bacterial agents as the cause of diseases classified elsewhere: Secondary | ICD-10-CM

## 2020-04-15 DIAGNOSIS — J019 Acute sinusitis, unspecified: Secondary | ICD-10-CM

## 2020-04-15 MED ORDER — DOXYCYCLINE HYCLATE 100 MG PO CAPS: 100.0000 mg | ORAL_CAPSULE | Freq: Two times a day (BID) | ORAL | 0 refills | Status: DC

## 2020-04-15 NOTE — Progress Notes (Signed)

## 2020-04-15 NOTE — Progress Notes (Signed)
I have spent 5 minutes in review of e-visit questionnaire, review and updating patient chart, medical decision making and response to patient.   Akeen Ledyard Cody Carlous Olivares, PA-C    

## 2020-04-17 ENCOUNTER — Other Ambulatory Visit: Payer: Self-pay

## 2020-04-17 ENCOUNTER — Ambulatory Visit
Admission: RE | Admit: 2020-04-17 | Discharge: 2020-04-17 | Disposition: A | Discharge status: Home / Self Care | Payer: Managed Care, Other (non HMO) | Source: Ambulatory Visit | Attending: Gastroenterology | Admitting: Gastroenterology

## 2020-04-17 DIAGNOSIS — R1013 Epigastric pain: Secondary | ICD-10-CM | POA: Diagnosis present

## 2020-04-17 IMAGING — US US ABDOMEN LIMITED
1 series · 14 of 25 positions shown · non-contrast
Comparison: None.

CLINICAL DATA: Dyspepsia.

EXAM:
ULTRASOUND ABDOMEN LIMITED RIGHT UPPER QUADRANT

[Series 1: us abdomen limited · 0.18mm/px · 14 of 41 slices shown]
[im 1/41]
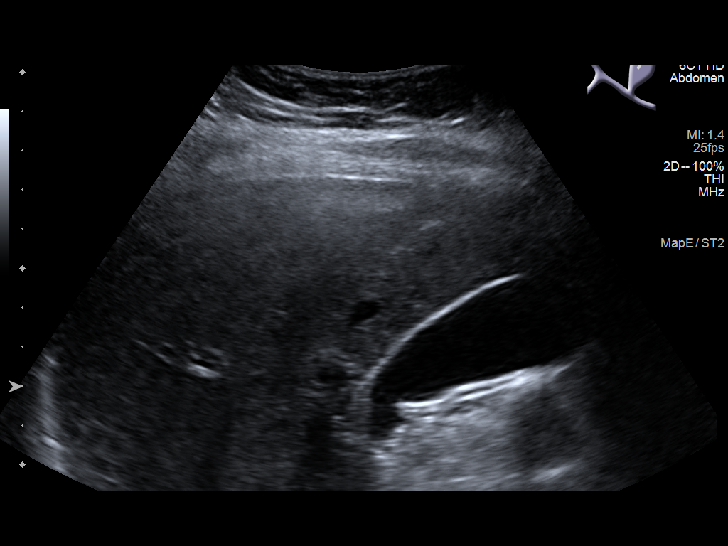
[im 4/41]
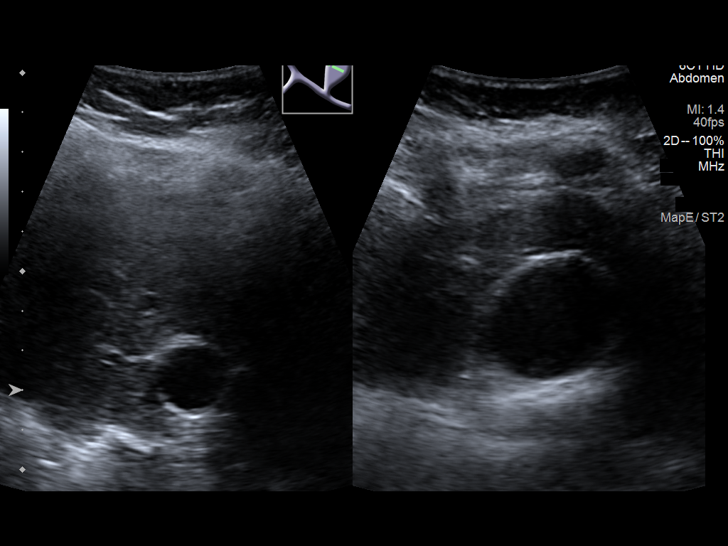
[im 7/41]
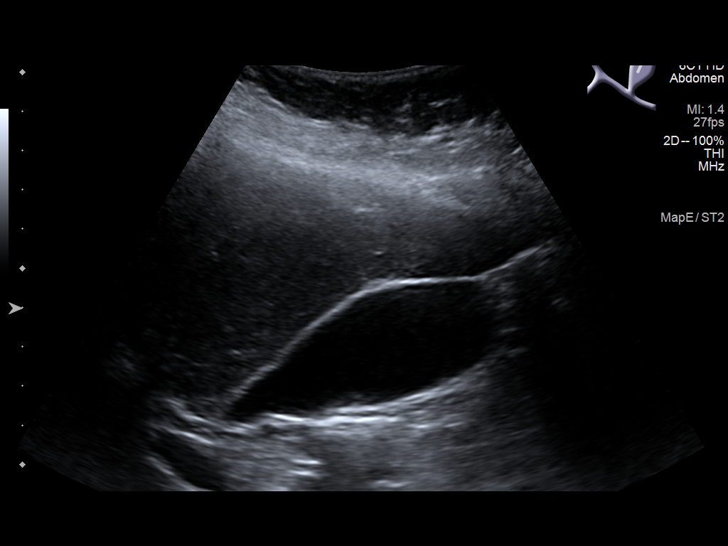
[im 11/41]
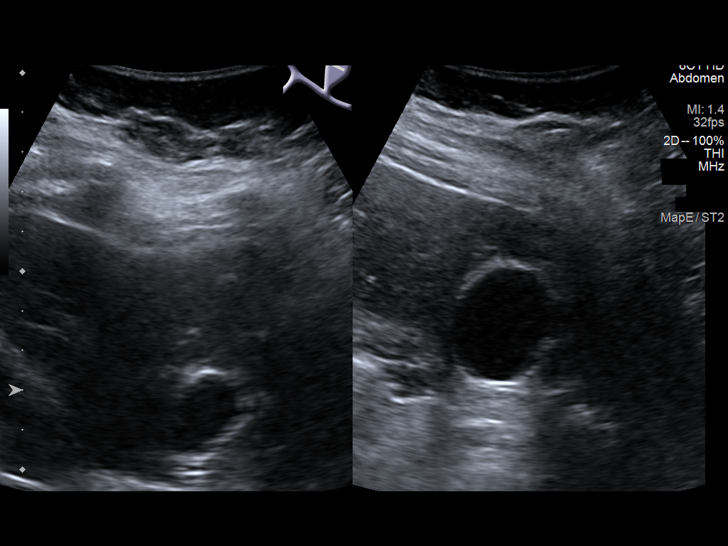
[im 14/41]
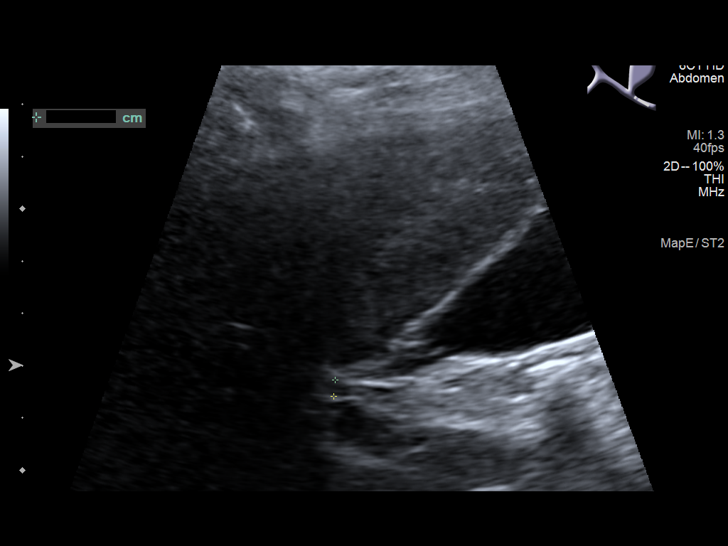
[im 16/41]
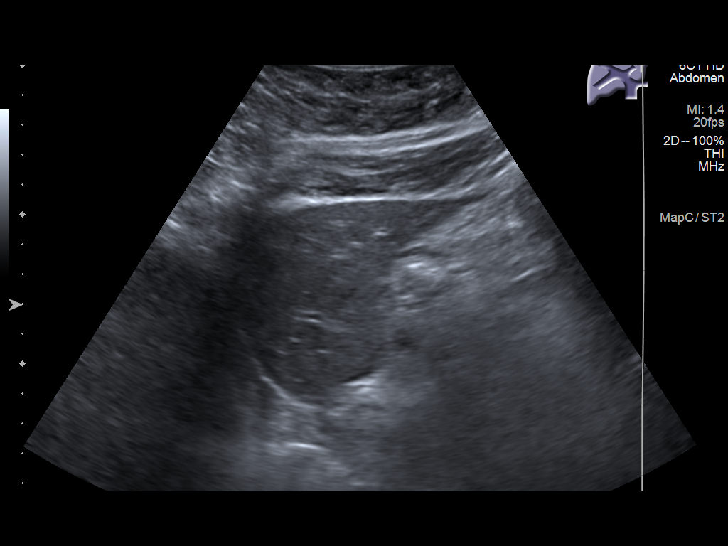
[im 19/41]
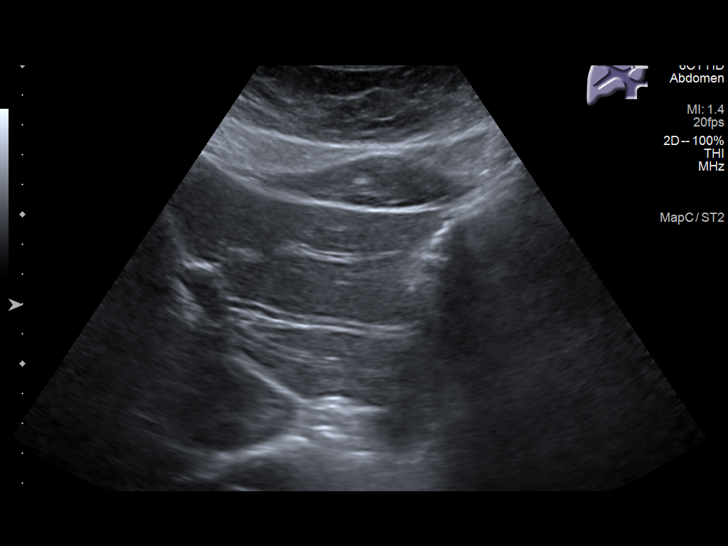
[im 22/41]
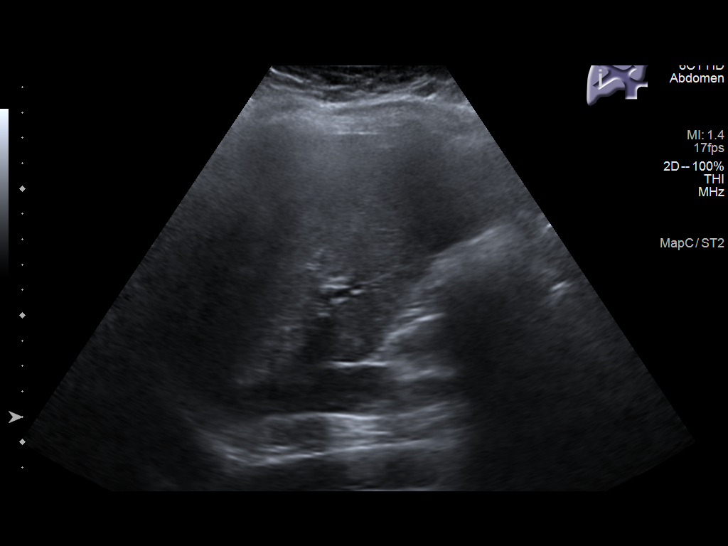
[im 26/41]
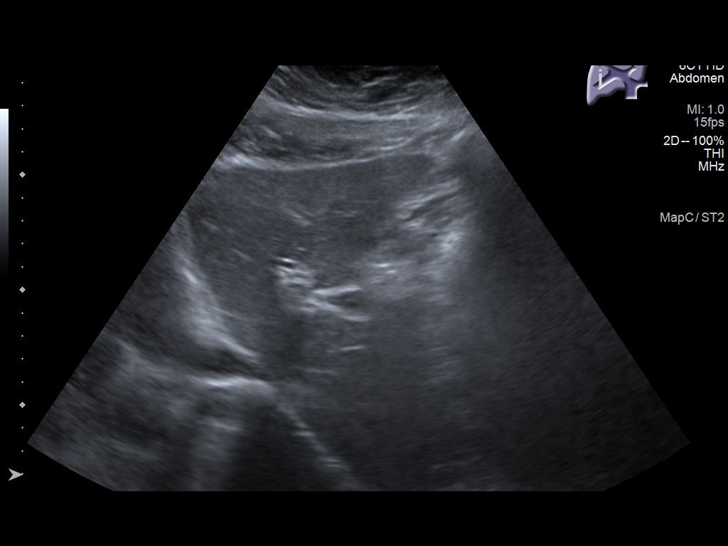
[im 27/41]
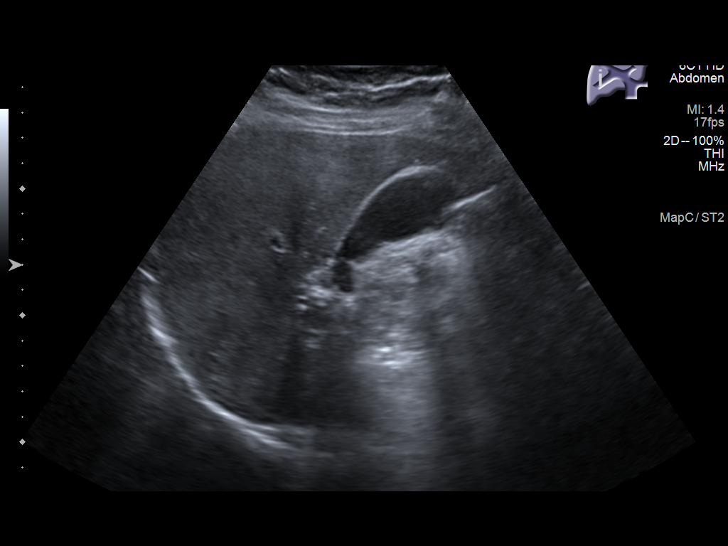
[im 31/41]
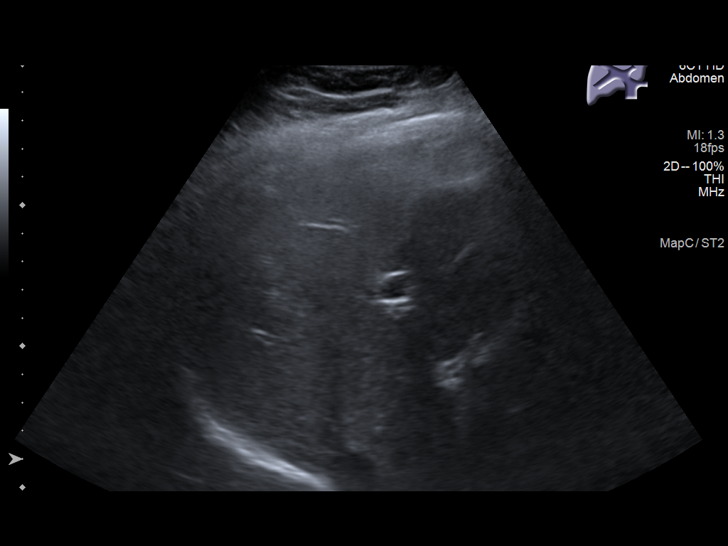
[im 34/41]
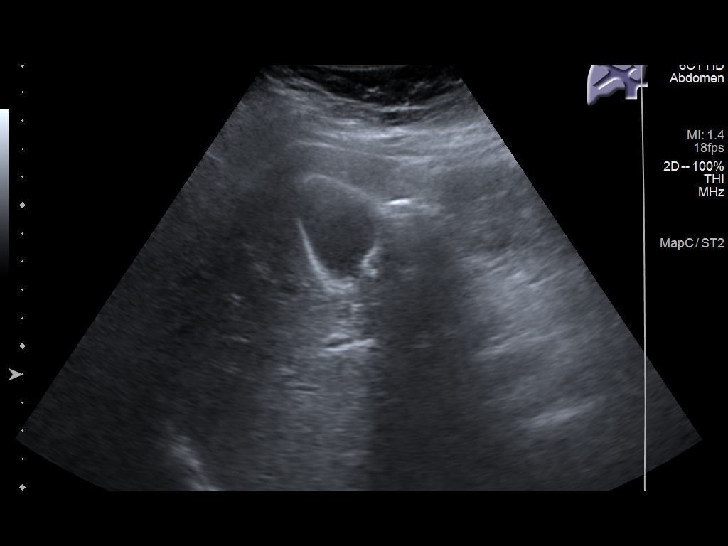
[im 37/41]
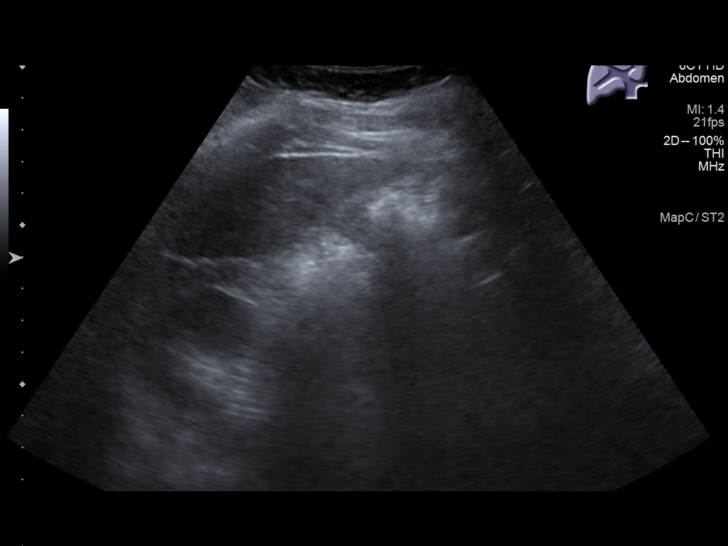
[im 41/41]
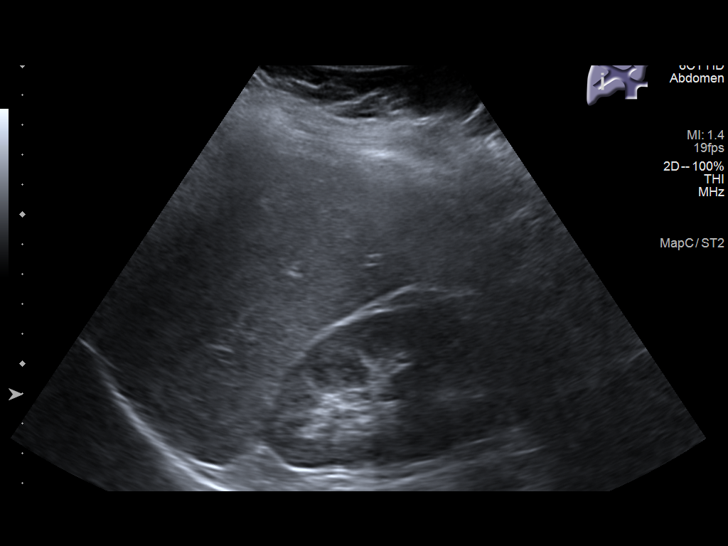

[14 of 25 positions shown; findings below may reference images not displayed]

FINDINGS: Gallbladder:

No gallstones or wall thickening visualized. No sonographic Murphy
sign noted by sonographer.

Common bile duct:

Diameter: 3 mm, normal.

Liver:

No focal lesion identified. Within normal limits in parenchymal
echogenicity. Portal vein is patent on color Doppler imaging with
normal direction of blood flow towards the liver.

Other: None.
IMPRESSION: 1. Normal right upper quadrant ultrasound.

## 2020-04-29 ENCOUNTER — Other Ambulatory Visit: Payer: Self-pay | Admitting: Family

## 2020-04-29 ENCOUNTER — Encounter: Payer: Self-pay | Admitting: Family

## 2020-04-29 DIAGNOSIS — F419 Anxiety disorder, unspecified: Secondary | ICD-10-CM

## 2020-04-29 MED ORDER — SERTRALINE HCL 50 MG PO TABS: 100.0000 mg | ORAL_TABLET | Freq: Every day | ORAL | 3 refills | Status: DC

## 2020-05-06 ENCOUNTER — Other Ambulatory Visit: Payer: Self-pay | Admitting: Family

## 2020-05-06 DIAGNOSIS — Z0000 Encounter for general adult medical examination without abnormal findings: Secondary | ICD-10-CM

## 2020-05-24 ENCOUNTER — Telehealth: Payer: Managed Care, Other (non HMO) | Admitting: Physician Assistant

## 2020-05-24 DIAGNOSIS — R300 Dysuria: Secondary | ICD-10-CM | POA: Diagnosis not present

## 2020-05-24 MED ORDER — NITROFURANTOIN MONOHYD MACRO 100 MG PO CAPS: 100.0000 mg | ORAL_CAPSULE | Freq: Two times a day (BID) | ORAL | 0 refills | Status: AC

## 2020-05-24 NOTE — Progress Notes (Signed)
We are sorry that you are not feeling well.  Here is how we plan to help! ° °Based on what you shared with me it looks like you most likely have a simple urinary tract infection. ° °A UTI (Urinary Tract Infection) is a bacterial infection of the bladder. ° °Most cases of urinary tract infections are simple to treat but a key part of your care is to encourage you to drink plenty of fluids and watch your symptoms carefully. ° °I have prescribed MacroBid 100 mg twice a day for 5 days.  Your symptoms should gradually improve. Call us if the burning in your urine worsens, you develop worsening fever, back pain or pelvic pain or if your symptoms do not resolve after completing the antibiotic. ° °Urinary tract infections can be prevented by drinking plenty of water to keep your body hydrated.  Also be sure when you wipe, wipe from front to back and don't hold it in!  If possible, empty your bladder every 4 hours. ° °Your e-visit answers were reviewed by a board certified advanced clinical practitioner to complete your personal care plan.  Depending on the condition, your plan could have included both over the counter or prescription medications. ° °If there is a problem please reply  once you have received a response from your provider. ° °Your safety is important to us.  If you have drug allergies check your prescription carefully.   ° °You can use MyChart to ask questions about today’s visit, request a non-urgent call back, or ask for a work or school excuse for 24 hours related to this e-Visit. If it has been greater than 24 hours you will need to follow up with your provider, or enter a new e-Visit to address those concerns. ° ° °You will get an e-mail in the next two days asking about your experience.  I hope that your e-visit has been valuable and will speed your recovery. Thank you for using e-visits. ° °Approximately 5 minutes was spent documenting and reviewing patient's chart.  ° ° ° °

## 2020-06-30 ENCOUNTER — Telehealth: Payer: Managed Care, Other (non HMO) | Admitting: Physician Assistant

## 2020-06-30 ENCOUNTER — Encounter: Payer: Self-pay | Admitting: Physician Assistant

## 2020-06-30 DIAGNOSIS — R300 Dysuria: Secondary | ICD-10-CM

## 2020-06-30 NOTE — Progress Notes (Signed)
Based on what you shared with me, I feel your condition warrants further evaluation and I recommend that you be seen in a face to face office visit.  Ms. Reeg Sorry you are not feeling well! It appears that you may have had similar symptoms in the past 5 weeks and were treated with antibiotic. Getting another similar infection warrants that you have a face to face visit for further evaluation and to include possible urine culture to ensure that the correct bacteria causing your symptoms is identified and that you are appropriately treated.    NOTE: If you entered your credit card information for this eVisit, you will not be charged. You may see a "hold" on your card for the $35 but that hold will drop off and you will not have a charge processed.   If you are having a true medical emergency please call 911.      For an urgent face to face visit, [facility] has six urgent care centers for your convenience:     [facility] at [facility] Directions [phone] [address] [place], [place] [address] . 8 am - 4 pm Monday - Friday    [facility]) Get Driving Directions [phone] [address] [place], [place] [address] . 8 am to 8 pm Monday-Friday . 10 am to 6 pm [facility] Urgent [facility]) Get Driving Directions [phone]  [address] [place],  [place]  [address] . 8 am to 8 pm Monday-Friday . 8 am to 4 pm [facility] Urgent Care at [facility] Get Driving Directions [phone] [address] [place], [place] [address] . 8 am to 8 pm Monday-Friday . 8 am to 4 pm [facility]gent Care at [facility] Get Driving Directions  [phone] [address] [place], [place] [address] . 8 am to 8 pm Monday-Friday . 8 am to 4 pm [facility]gent Care at [facility] Directions [phone] [address], [address] [place], [place] [address] . 8 am to 8 pm Monday-Friday . 8 am to 4 pm Saturday-Sunday     Your MyChart E-visit questionnaire answers were reviewed by a board certified advanced clinical practitioner to complete your personal care plan based on your specific symptoms.  Thank you for using e-Visits.    I spent 5-10 minutes on review and completion of this note- [facility]

## 2020-07-01 ENCOUNTER — Other Ambulatory Visit: Payer: Self-pay | Admitting: Family

## 2020-07-01 DIAGNOSIS — Z0000 Encounter for general adult medical examination without abnormal findings: Secondary | ICD-10-CM

## 2020-07-09 ENCOUNTER — Encounter: Payer: Self-pay | Admitting: Family

## 2020-07-09 ENCOUNTER — Other Ambulatory Visit: Payer: Self-pay

## 2020-07-09 ENCOUNTER — Ambulatory Visit (INDEPENDENT_AMBULATORY_CARE_PROVIDER_SITE_OTHER): Payer: Managed Care, Other (non HMO) | Admitting: Family

## 2020-07-09 VITALS — BP 92/62 | HR 92 | Temp 97.6°F | Ht 67.25 in | Wt 206.4 lb

## 2020-07-09 DIAGNOSIS — K219 Gastro-esophageal reflux disease without esophagitis: Secondary | ICD-10-CM | POA: Diagnosis not present

## 2020-07-09 DIAGNOSIS — R519 Headache, unspecified: Secondary | ICD-10-CM | POA: Insufficient documentation

## 2020-07-09 DIAGNOSIS — F32A Depression, unspecified: Secondary | ICD-10-CM

## 2020-07-09 DIAGNOSIS — R21 Rash and other nonspecific skin eruption: Secondary | ICD-10-CM

## 2020-07-09 DIAGNOSIS — F419 Anxiety disorder, unspecified: Secondary | ICD-10-CM

## 2020-07-09 MED ORDER — HYDROXYZINE HCL 10 MG PO TABS: 10.0000 mg | ORAL_TABLET | Freq: Every day | ORAL | 1 refills | Status: DC

## 2020-07-09 MED ORDER — SERTRALINE HCL 50 MG PO TABS: 100.0000 mg | ORAL_TABLET | Freq: Every day | ORAL | 3 refills | Status: DC

## 2020-07-09 MED ORDER — TOPIRAMATE 25 MG PO TABS: 25.0000 mg | ORAL_TABLET | Freq: Every day | ORAL | 1 refills | Status: DC

## 2020-07-09 MED ORDER — TRIAMCINOLONE ACETONIDE 0.025 % EX CREA: 1.0000 "application " | TOPICAL_CREAM | Freq: Two times a day (BID) | CUTANEOUS | 0 refills | Status: DC

## 2020-07-09 MED ORDER — CLOTRIMAZOLE 1 % EX OINT: 1.0000 "application " | TOPICAL_OINTMENT | Freq: Two times a day (BID) | CUTANEOUS | 1 refills | Status: DC

## 2020-07-09 MED ORDER — PANTOPRAZOLE SODIUM 20 MG PO TBEC: 20.0000 mg | DELAYED_RELEASE_TABLET | Freq: Every day | ORAL | 1 refills | Status: DC | PRN

## 2020-07-09 NOTE — Assessment & Plan Note (Signed)
Controlled. Discussed risk of long use ppi's. Advised to wean protonix to 20mg  and to use prn.

## 2020-07-09 NOTE — Assessment & Plan Note (Addendum)
Presentation consistent with candida versus atopic dermatitis. Start kenalog, clotrimazole.  Referral to dermatology for cutaneous warts on her hands.

## 2020-07-09 NOTE — Patient Instructions (Addendum)
Start topamax for headache prevention. This medication is NOT safe in pregnancy so please let me know if you become sexually active.   Increase zoloft to 100mg   MRI brain ordered Let know if you dont hear back within a week in regards to an appointment being scheduled.    Start protonix 20mg  daily as needed.  Long term use beyond 3 months of proton pump inhibitors , also called PPI's, is associated with malabsorption of vitamins, chronic kidney disease, fracture risk, and diarrheal illnesses. PPI's include Nexium, Prilosec, Protonix, Dexilant, and Prevacid.   I generally recommend trying to control acid reflux with lifestyle modifications including avoiding trigger foods, not eating 2 hours prior to bedtime. You may use histamine 2 blockers daily to twice daily ( this is Pepcid) and then when symptoms flare, start back on PPI for short course. PLEASE NOTE: Zantac is a histamine blocker like Pepcid and the FDA has requested that Zantac be taken OFF THE MARKET. Please do NOT take this medication as there are concerns that it is cancer causing.   Of note, we will need to do an endoscopy ( upper GI) to evaluate your esophagus, stomach in the future if acid reflux persists are you develop red flag symptoms: trouble swallowing, hoarseness, chronic cough, unexplained weight loss.

## 2020-07-09 NOTE — Progress Notes (Signed)
Subjective:    Patient ID: Melissa Walton, female    DOB: [birth year], 30 y.o.   MRN: [id number]  CC: Melissa Walton is a 30 y.o. female who presents today for follow up.   HPI: She complains of HA for the past 10 years.  HA are more frequent. She gets 4 HA per week. HA are bilateral frontal or occasionally posterior.   HAs feel similar to HA's in the past.    She may have bilateral vision that is slightly blurry. Endorses nausea without vomiting, photophobia.  No numbness to face, confusion, vision loss. .   Triggers include menses, dehydration, sunlight, allergies.   She will take aleve however h/o hematemesis, GIB and she doesn't use NSAIDs. Rare caffeine. No drug use. Rare alcohol use.    Sleep helps to relieve.   HA awakens her at night which is normal for her.  HA is not positional, nor increase with valsalva.   Wears glasses; last eye exam was less than one year.   She is not sexually active  Pruritic rash right crease forearm 3 weeks ago, waxing and waning.  No new lotions, creams. She has not been around poison ivy or seen a tick. She hasnt tried any medication. No fever, n.  She also had warts on her hands.   She saw [facility] 3 months ago for GERD. Compliant with protonoix 40mg . No epigastric pain, nausea.  Normal RUQ .    Anxiety and depression- compliant with zoloft 50mg  however around her period time she feels more depression, anxiety.      HISTORY:  Past Medical History:  Diagnosis Date  . Concussion    high school   History reviewed. No pertinent surgical history. Family History  Problem Relation Age of Onset  . Diabetes Mother   . Hyperlipidemia Mother   . Hypertension Father   . Gout Father   . Heart disease Maternal Grandfather   . Ovarian cancer Paternal Grandmother     Allergies: Nsaids, Loracarbef, and Penicillins Current Outpatient Medications on File Prior to Visit  Medication Sig Dispense Refill  . ipratropium  (ATROVENT) 0.03 % nasal spray Place 2 sprays into both nostrils 3 (three) times daily as needed for rhinitis. (Patient not taking: Reported on 07/09/2020) 30 mL 0  . [DISCONTINUED] omeprazole (PRILOSEC OTC) 20 MG tablet Take 1 tablet (20 mg total) by mouth daily. 30 tablet 6   No current facility-administered medications on file prior to visit.    Social History   Tobacco Use  . Smoking status: Never Smoker  . Smokeless tobacco: Never Used  Substance Use Topics  . Alcohol use: No  . Drug use: No    Review of Systems  Constitutional: Negative for chills and fever.  Eyes: Positive for photophobia. Negative for visual disturbance.  Respiratory: Negative for cough.   Cardiovascular: Negative for chest pain and palpitations.  Gastrointestinal: Negative for nausea and vomiting.  Skin: Positive for rash.  Neurological: Positive for headaches. Negative for dizziness.      Objective:    BP 92/62   Pulse 92   Temp 97.6 F (36.4 C)   Ht 5' 7.25" (1.708 m)   Wt 206 lb 6.4 oz (93.6 kg)   SpO2 99%   BMI 32.09 kg/m  BP Readings from Last 3 Encounters:  07/09/20 92/62  04/09/20 122/80  01/08/20 116/78   Wt Readings from Last 3 Encounters:  07/09/20 206 lb 6.4 oz (93.6 kg)  04/09/20 218 lb  12.8 oz (99.2 kg)  01/08/20 209 lb 12.8 oz (95.2 kg)    Physical Exam Vitals reviewed.  Constitutional:      Appearance: She is well-developed.  HENT:     Head: Normocephalic and atraumatic.     Right Ear: Hearing, tympanic membrane, ear canal and external ear normal. No decreased hearing noted. No drainage, swelling or tenderness. No middle ear effusion. No foreign body. Tympanic membrane is not erythematous or bulging.     Left Ear: Hearing, tympanic membrane, ear canal and external ear normal. No decreased hearing noted. No drainage, swelling or tenderness.  No middle ear effusion. No foreign body. Tympanic membrane is not erythematous or bulging.     Nose: Nose normal. No rhinorrhea.      Right Sinus: No maxillary sinus tenderness or frontal sinus tenderness.     Left Sinus: No maxillary sinus tenderness or frontal sinus tenderness.     Mouth/Throat:     Pharynx: Uvula midline. No oropharyngeal exudate or posterior oropharyngeal erythema.     Tonsils: No tonsillar abscesses.  Eyes:     Conjunctiva/sclera: Conjunctivae normal.     Pupils: Pupils are equal, round, and reactive to light.     Comments: Fundus normal bilaterally.   Cardiovascular:     Rate and Rhythm: Normal rate and regular rhythm.     Pulses: Normal pulses.     Heart sounds: Normal heart sounds.  Pulmonary:     Effort: Pulmonary effort is normal.     Breath sounds: Normal breath sounds. No wheezing, rhonchi or rales.  Lymphadenopathy:     Head:     Right side of head: No submental, submandibular, tonsillar, preauricular, posterior auricular or occipital adenopathy.     Left side of head: No submental, submandibular, tonsillar, preauricular, posterior auricular or occipital adenopathy.     Cervical: No cervical adenopathy.  Skin:    General: Skin is warm and dry.          Comments: Hyper erythematous patch with 2-3 discrete macules. No purulent discharge or vesicular lesions noted.   Neurological:     Mental Status: She is alert.     Cranial Nerves: No cranial nerve deficit.     Sensory: No sensory deficit.     Deep Tendon Reflexes:     Reflex Scores:      Bicep reflexes are 2+ on the right side and 2+ on the left side.      Patellar reflexes are 2+ on the right side and 2+ on the left side.    Comments: Grip equal and strong bilateral upper extremities. Gait strong and steady. Able to perform rapid alternating movement without difficulty.   Psychiatric:        Speech: Speech normal.        Behavior: Behavior normal.        Thought Content: Thought content normal.        Assessment & Plan:   Problem List Items Addressed This Visit      Digestive   GERD (gastroesophageal reflux disease)     Controlled. Discussed risk of long use ppi's. Advised to wean protonix to 20mg  and to use prn.       Relevant Medications   pantoprazole (PROTONIX) 20 MG tablet     Musculoskeletal and Integument   Rash    Presentation consistent with candida versus atopic dermatitis. Start kenalog, clotrimazole.  Referral to dermatology for cutaneous warts on her hands.       Relevant Medications  triamcinolone (KENALOG) 0.025 % cream   Clotrimazole 1 % OINT   Other Relevant Orders   Ambulatory referral to Dermatology     Other   Anxiety and depression    Suboptimal control around  Menses. Increase zoloft to 100mg . May consider effexor in the future in setting of HA.       Relevant Medications   sertraline (ZOLOFT) 50 MG tablet   hydrOXYzine (ATARAX/VISTARIL) 10 MG tablet   Headache - Primary    Chronic. HA frequency increasing and is awakening her from sleep. Pending MRI brain. Start topamax 25mg . Counseled regarding vigilance if HA were to have an abrupt onset , severe, vision loss, she would need to go to ED. Close follow up      Relevant Medications   sertraline (ZOLOFT) 50 MG tablet   topiramate (TOPAMAX) 25 MG tablet   Other Relevant Orders   MR BRAIN W WO CONTRAST       I have discontinued Tomicka L. Scarpelli's pantoprazole, doxycycline, and hydrOXYzine. I have also changed her sertraline and hydrOXYzine. Additionally, I am having her start on pantoprazole, topiramate, triamcinolone, and Clotrimazole. Lastly, I am having her maintain her ipratropium.   Meds ordered this encounter  Medications  . pantoprazole (PROTONIX) 20 MG tablet    Sig: Take 1 tablet (20 mg total) by mouth daily as needed.    Dispense:  90 tablet    Refill:  1    Order Specific Question:   Supervising Provider    Answer:   L [[id number]]  . sertraline (ZOLOFT) 50 MG tablet    Sig: Take 2 tablets (100 mg total) by mouth daily.    Dispense:  90 tablet    Refill:  3    Order Specific Question:    Supervising Provider    Answer:   L [[id number]]  . hydrOXYzine (ATARAX/VISTARIL) 10 MG tablet    Sig: Take 1 tablet (10 mg total) by mouth at bedtime.    Dispense:  90 tablet    Refill:  1    Order Specific Question:   Supervising Provider    Answer:   Duncan Dull L [[id number]]  . topiramate (TOPAMAX) 25 MG tablet    Sig: Take 1 tablet (25 mg total) by mouth at bedtime.    Dispense:  90 tablet    Refill:  1    Order Specific Question:   Supervising Provider    Answer:   Duncan Dull L [[id number]]  . triamcinolone (KENALOG) 0.025 % cream    Sig: Apply 1 application topically 2 (two) times daily.    Dispense:  80 g    Refill:  0    Order Specific Question:   Supervising Provider    Answer:   Duncan Dull, TERESA L [[id number]]  . Clotrimazole 1 % OINT    Sig: Apply 1 application topically 2 (two) times daily.    Dispense:  56.7 g    Refill:  1    Order Specific Question:   Supervising Provider    Answer:   Duncan Dull [[id number]]    Return precautions given.   Risks, benefits, and alternatives of the medications and treatment plan prescribed today were discussed, and patient expressed understanding.   Education regarding symptom management and diagnosis given to patient on AVS.  Continue to follow with Darrick Huntsman, FNP for routine health maintenance.   Sherlene Shams and I agreed with plan.   [name], FNP

## 2020-07-09 NOTE — Assessment & Plan Note (Signed)
Chronic. HA frequency increasing and is awakening her from sleep. Pending MRI brain. Start topamax 25mg . Counseled regarding vigilance if HA were to have an abrupt onset , severe, vision loss, she would need to go to ED. Close follow up

## 2020-07-09 NOTE — Assessment & Plan Note (Signed)
Suboptimal control around  Menses. Increase zoloft to 100mg . May consider effexor in the future in setting of HA.

## 2020-07-10 ENCOUNTER — Telehealth: Payer: Self-pay | Admitting: Family

## 2020-07-10 DIAGNOSIS — R519 Headache, unspecified: Secondary | ICD-10-CM

## 2020-07-10 NOTE — Telephone Encounter (Signed)
Would be willing to send one time med for patient?

## 2020-07-10 NOTE — Telephone Encounter (Signed)
Pt would like if possible to have something to calm her during the MRI? Please advise and Thank you!  Call pt @ [phone].  Pharmacy is [id number] - [place], [place] - [address] AT [facility] [address] [facility]

## 2020-07-11 ENCOUNTER — Encounter: Payer: Self-pay | Admitting: Family

## 2020-07-11 MED ORDER — ALPRAZOLAM 0.5 MG PO TABS: 0.5000 mg | ORAL_TABLET | Freq: Once | ORAL | 0 refills | Status: AC

## 2020-07-11 NOTE — Telephone Encounter (Signed)
Call pt I sent in xanax ahead of MRI  I looked up patient on [place] Controlled Substances Reporting System PMP AWARE and saw no activity that raised concern of inappropriate use.

## 2020-07-11 NOTE — Addendum Note (Signed)
Addended by: [name] on: 07/11/2020 09:20 PM   Modules accepted: Orders

## 2020-07-12 ENCOUNTER — Ambulatory Visit: Payer: Managed Care, Other (non HMO) | Admitting: Internal Medicine

## 2020-07-12 NOTE — Telephone Encounter (Signed)
Pt called and aware that Xanax sent in for MRI.

## 2020-07-15 ENCOUNTER — Telehealth: Payer: Managed Care, Other (non HMO) | Admitting: Physician Assistant

## 2020-07-15 DIAGNOSIS — L03039 Cellulitis of unspecified toe: Secondary | ICD-10-CM | POA: Diagnosis not present

## 2020-07-16 MED ORDER — SULFAMETHOXAZOLE-TRIMETHOPRIM 800-160 MG PO TABS: 1.0000 | ORAL_TABLET | Freq: Two times a day (BID) | ORAL | 0 refills | Status: AC

## 2020-07-16 NOTE — Progress Notes (Signed)
E Visit for Cellulitis  We are sorry that you are not feeling well. Here is how we plan to help!  You have a paronychia.  I recommend warm water soaks 3x per day and taking the antibiotic.  If this does not improve in 2 days you will need a face to face visit for possible drainage.    Based on what you shared with me it looks like you have cellulitis.  Cellulitis looks like areas of skin redness, swelling, and warmth; it develops as a result of bacteria entering under the skin. Little red spots and/or bleeding can be seen in skin, and tiny surface sacs containing fluid can occur. Fever can be present. Cellulitis is almost always on one side of a body, and the lower limbs are the most common site of involvement.   I have prescribed:  Bactrim DS 1 tablet by mouth twice a day for 7 days  HOME CARE:  . Take your medications as ordered and take all of them, even if the skin irritation appears to be healing.   GET HELP RIGHT AWAY IF:  . Symptoms that don't begin to go away within 48 hours. . Severe redness persists or worsens . If the area turns color, spreads or swells. . If it blisters and opens, develops yellow-brown crust or bleeds. . You develop a fever or chills. . If the pain increases or becomes unbearable.  . Are unable to keep fluids and food down.  MAKE SURE YOU    Understand these instructions.  Will watch your condition.  Will get help right away if you are not doing well or get worse.  Thank you for choosing an e-visit. Your e-visit answers were reviewed by a board certified advanced clinical practitioner to complete your personal care plan. Depending upon the condition, your plan could have included both over the counter or prescription medications. Please review your pharmacy choice. Make sure the pharmacy is open so you can pick up prescription now. If there is a problem, you may contact your provider through [employer] and have the prescription routed to another  pharmacy. Your safety is important to [place]. If you have drug allergies check your prescription carefully.  For the next 24 hours you can use MyChart to ask questions about today's visit, request a non-urgent call back, or ask for a work or school excuse. You will get an email in the next two days asking about your experience. I hope that your e-visit has been valuable and will speed your recovery.  Greater than 5 minutes, yet less than 10 minutes of time have been spent researching, coordinating, and implementing care for this patient today

## 2020-07-22 ENCOUNTER — Ambulatory Visit: Payer: Managed Care, Other (non HMO)

## 2020-08-02 ENCOUNTER — Encounter: Payer: Self-pay | Admitting: Family

## 2020-08-05 ENCOUNTER — Other Ambulatory Visit: Payer: Self-pay

## 2020-08-05 MED ORDER — SERTRALINE HCL 100 MG PO TABS: 100.0000 mg | ORAL_TABLET | Freq: Every day | ORAL | 3 refills | Status: DC

## 2020-08-08 ENCOUNTER — Encounter: Payer: Self-pay | Admitting: Family

## 2020-08-08 ENCOUNTER — Ambulatory Visit
Admission: RE | Admit: 2020-08-08 | Discharge: 2020-08-08 | Disposition: A | Discharge status: Home / Self Care | Payer: Managed Care, Other (non HMO) | Source: Ambulatory Visit | Attending: Family | Admitting: Family

## 2020-08-08 ENCOUNTER — Other Ambulatory Visit: Payer: Self-pay

## 2020-08-08 DIAGNOSIS — R519 Headache, unspecified: Secondary | ICD-10-CM | POA: Insufficient documentation

## 2020-08-08 IMAGING — MR MR HEAD WO/W CM
14 series · 48 of 48 positions shown · IV contrast (gadavist)
Comparison: CT head [DATE]

CLINICAL DATA: Headache

EXAM:
MRI HEAD WITHOUT AND WITH CONTRAST
TECHNIQUE: Multiplanar, multiecho pulse sequences of the brain and surrounding
structures were obtained without and with intravenous contrast.
CONTRAST:  7.5mL GADAVIST GADOBUTROL 1 MMOL/ML IV SOLN

[Series 5: ax dwi_tracew · axial · 3.0mm · 0.65mm/px · z∈[-86,+69]mm · 3 of 48 slices shown]
[im 1/48]
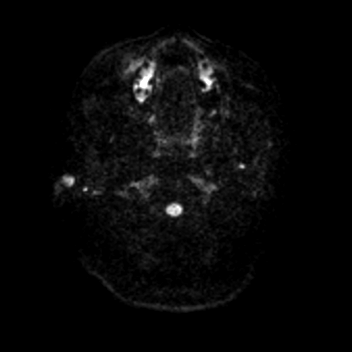
[im 24/48]
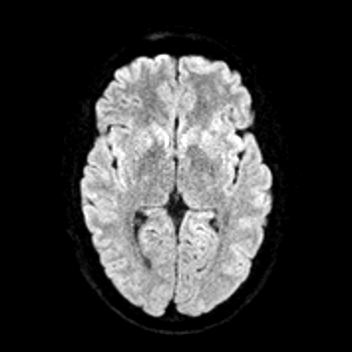
[im 48/48]
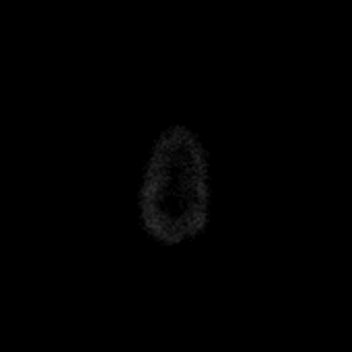

[Series 6: ax dwi_adc · axial · 3.0mm · 0.65mm/px · z∈[-86,+69]mm · 3 of 47 slices shown]
[im 1/47]
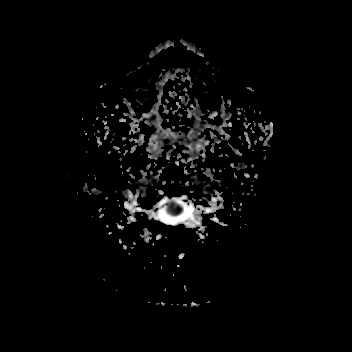
[im 24/47]
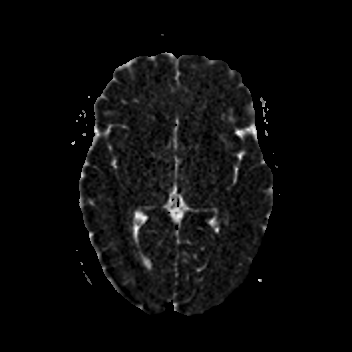
[im 47/47]
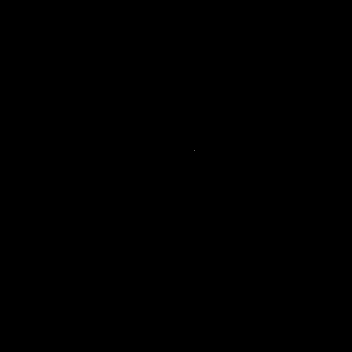

[Series 7: cor dwi_tracew · coronal · 5.0mm · 0.68mm/px · 2 of 40 slices shown]
[im 1/40]
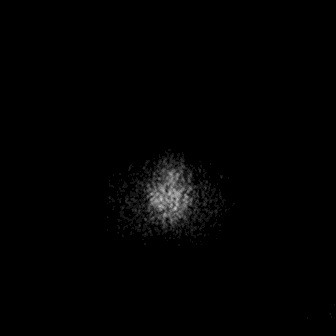
[im 40/40]
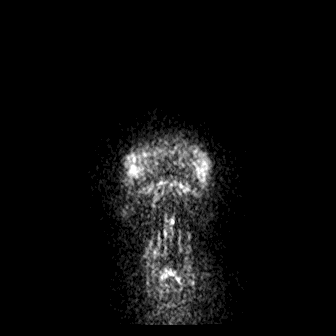

[Series 8: cor dwi_adc · coronal · 5.0mm · 0.68mm/px · 2 of 38 slices shown]
[im 1/38]
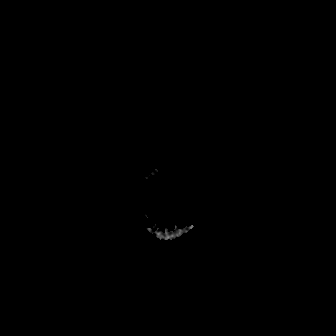
[im 38/38]
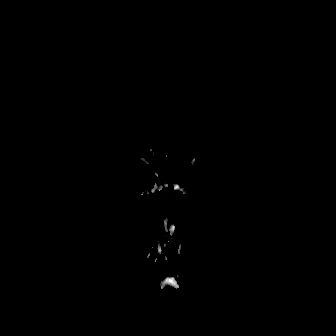

[Series 9: T1 · sagittal · 5.0mm · 0.62mm/px · 1 of 23 slices shown (1 of 2)]
[im 1/23]
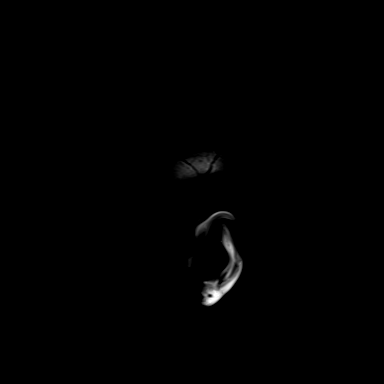

[Series 10: T2 · axial · 5.0mm · 0.53mm/px · 1 of 27 slices shown]
[im 1/27]
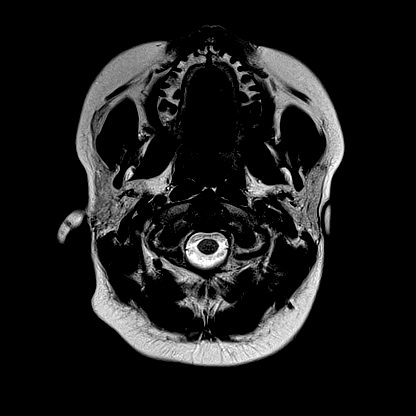

[Series 11: mag_images · axial · 3.0mm · 0.90mm/px · z∈[-96,+81]mm · 3 of 60 slices shown]
[im 1/60]
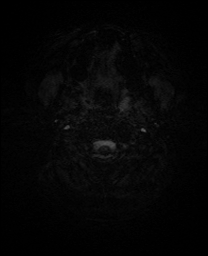
[im 30/60]
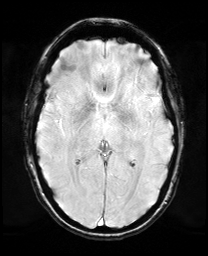
[im 60/60]
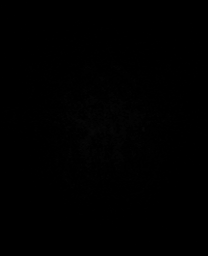

[Series 12: pha_images · axial · 3.0mm · 0.90mm/px · z∈[-96,+81]mm · 3 of 59 slices shown]
[im 1/59]
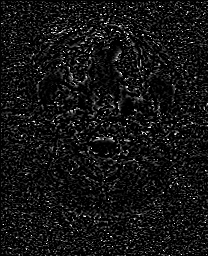
[im 30/59]
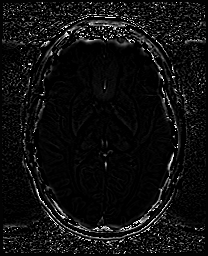
[im 59/59]
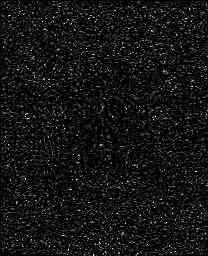

[Series 13: swi_images · axial · 3.0mm · 0.90mm/px · z∈[-96,+81]mm · 3 of 60 slices shown]
[im 1/60]
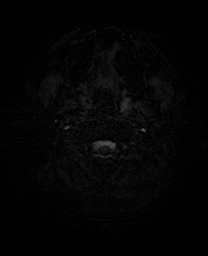
[im 30/60]
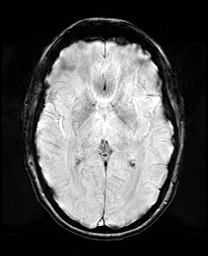
[im 60/60]
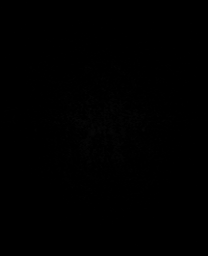

[Series 15: FLAIR · axial · 3.0mm · 0.53mm/px · z∈[-89,+73]mm · 3 of 55 slices shown]
[im 1/55]
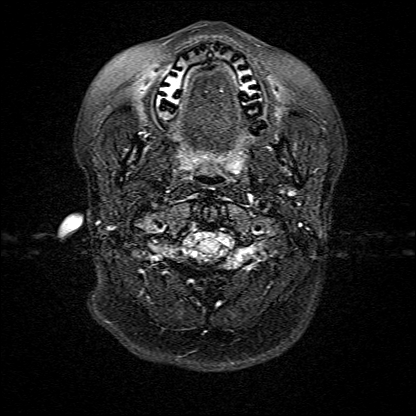
[im 28/55]
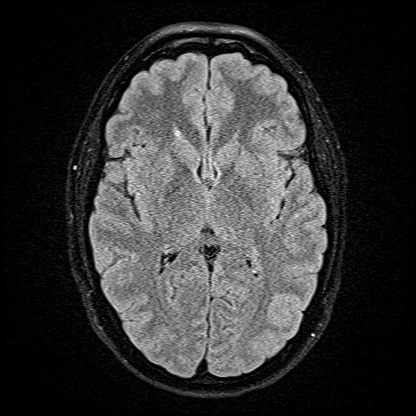
[im 55/55]
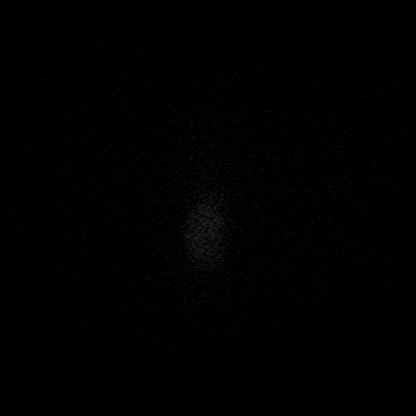

[Series 16: T1 · axial · 1.0mm · 0.98mm/px · z∈[-95,+80]mm · 10 of 176 slices shown (2 of 2)]
[im 1/176]
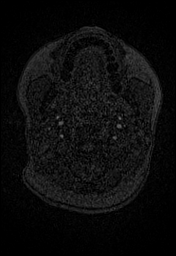
[im 20/176]
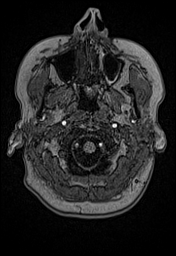
[im 39/176]
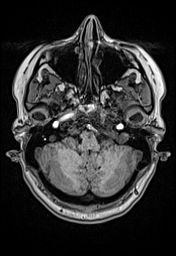
[im 59/176]
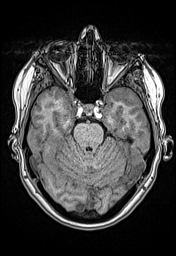
[im 78/176]
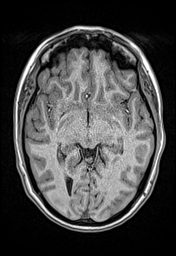
[im 98/176]
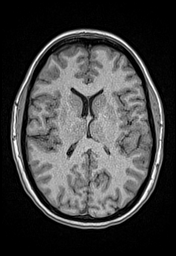
[im 117/176]
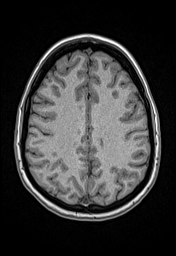
[im 137/176]
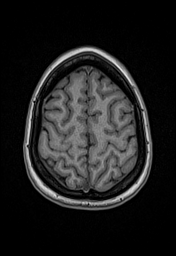
[im 156/176]
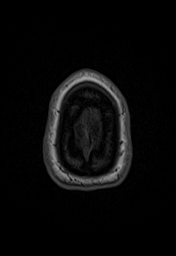
[im 176/176]
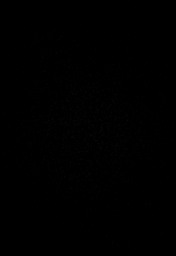

[Series 17: T2 post-contrast · coronal · 5.0mm · 0.57mm/px · 2 of 31 slices shown]
[im 1/31]
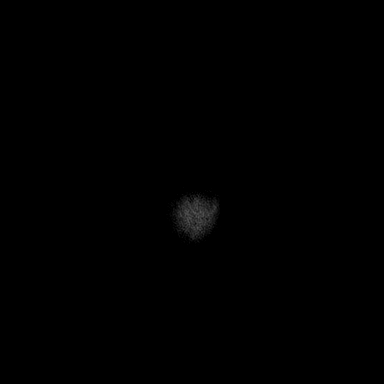
[im 31/31]
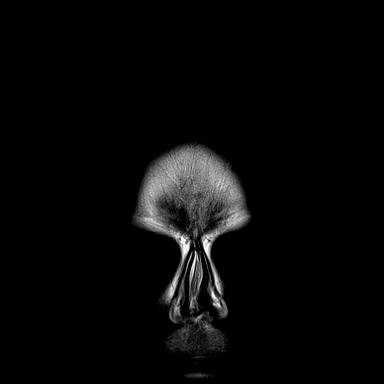

[Series 18: T1 post-contrast · coronal · 5.0mm · 0.57mm/px · 2 of 31 slices shown (1 of 2)]
[im 1/31]
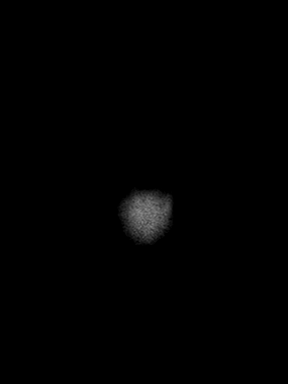
[im 31/31]
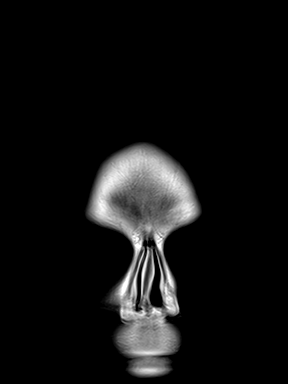

[Series 19: T1 post-contrast · axial · 1.0mm · 0.98mm/px · z∈[-95,+79]mm · 10 of 175 slices shown (2 of 2)]
[im 1/175]
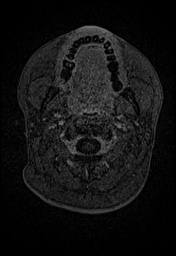
[im 20/175]
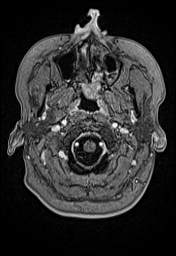
[im 39/175]
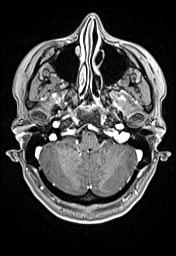
[im 59/175]
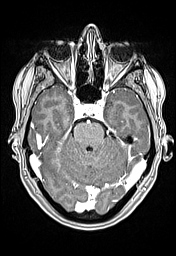
[im 78/175]
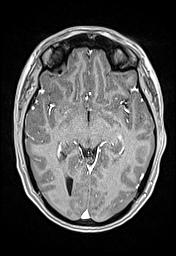
[im 97/175]
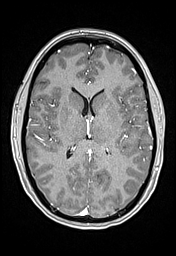
[im 117/175]
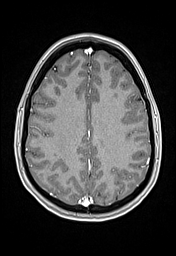
[im 136/175]
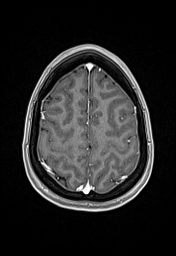
[im 155/175]
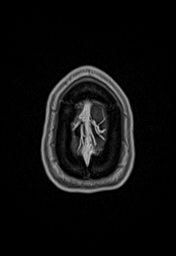
[im 175/175]
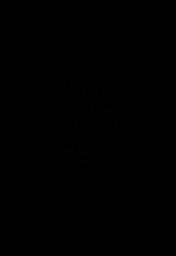

[48 of 48 positions shown; findings below may reference images not displayed]

FINDINGS: Brain: No acute infarction, hemorrhage, hydrocephalus, extra-axial
collection or mass lesion. Normal white matter

Normal enhancement.

Vascular: Normal arterial flow voids.  Normal venous enhancement.

Skull and upper cervical spine: Negative

Sinuses/Orbits: Negative

Other: None
IMPRESSION: Normal MRI head with contrast.

## 2020-08-08 MED ORDER — GADOBUTROL 1 MMOL/ML IV SOLN
9.0000 mL | Freq: Once | INTRAVENOUS | Status: AC | PRN
  Administered 2020-08-08: 7.5 mL via INTRAVENOUS

## 2020-08-14 ENCOUNTER — Other Ambulatory Visit: Payer: Self-pay | Admitting: Family

## 2020-08-14 DIAGNOSIS — R519 Headache, unspecified: Secondary | ICD-10-CM

## 2020-08-14 MED ORDER — TOPIRAMATE 50 MG PO TABS: 50.0000 mg | ORAL_TABLET | Freq: Every day | ORAL | 1 refills | Status: DC

## 2020-08-19 ENCOUNTER — Other Ambulatory Visit: Payer: Self-pay | Admitting: Family

## 2020-08-19 DIAGNOSIS — K219 Gastro-esophageal reflux disease without esophagitis: Secondary | ICD-10-CM

## 2020-08-27 ENCOUNTER — Telehealth: Payer: Self-pay | Admitting: Family

## 2020-08-27 ENCOUNTER — Other Ambulatory Visit: Payer: Self-pay

## 2020-08-27 DIAGNOSIS — F32A Depression, unspecified: Secondary | ICD-10-CM

## 2020-08-27 DIAGNOSIS — F419 Anxiety disorder, unspecified: Secondary | ICD-10-CM

## 2020-08-27 MED ORDER — HYDROXYZINE HCL 10 MG PO TABS: 10.0000 mg | ORAL_TABLET | Freq: Every day | ORAL | 1 refills | Status: DC

## 2020-08-27 MED ORDER — PANTOPRAZOLE SODIUM 20 MG PO TBEC: 20.0000 mg | DELAYED_RELEASE_TABLET | Freq: Every day | ORAL | 1 refills | Status: DC | PRN

## 2020-08-27 NOTE — Telephone Encounter (Signed)
PT called to request a refill of their as they are running low.  pantoprazole (PROTONIX) 20 MG tablet hydrOXYzine (ATARAX/VISTARIL) 10 MG tablet

## 2020-08-27 NOTE — Telephone Encounter (Signed)
LM that prescriptions sent.

## 2020-08-30 ENCOUNTER — Ambulatory Visit: Payer: Managed Care, Other (non HMO) | Admitting: Family

## 2020-08-30 ENCOUNTER — Other Ambulatory Visit: Payer: Managed Care, Other (non HMO)

## 2020-09-03 ENCOUNTER — Telehealth: Payer: Managed Care, Other (non HMO) | Admitting: Physician Assistant

## 2020-09-03 ENCOUNTER — Other Ambulatory Visit: Payer: Self-pay

## 2020-09-03 DIAGNOSIS — R300 Dysuria: Secondary | ICD-10-CM | POA: Diagnosis not present

## 2020-09-03 DIAGNOSIS — F32A Depression, unspecified: Secondary | ICD-10-CM

## 2020-09-03 MED ORDER — NITROFURANTOIN MONOHYD MACRO 100 MG PO CAPS: 100.0000 mg | ORAL_CAPSULE | Freq: Two times a day (BID) | ORAL | 0 refills | Status: DC

## 2020-09-03 MED ORDER — PANTOPRAZOLE SODIUM 20 MG PO TBEC: 20.0000 mg | DELAYED_RELEASE_TABLET | Freq: Every day | ORAL | 1 refills | Status: DC | PRN

## 2020-09-03 MED ORDER — HYDROXYZINE HCL 10 MG PO TABS: 10.0000 mg | ORAL_TABLET | Freq: Every day | ORAL | 1 refills | Status: DC

## 2020-09-03 NOTE — Progress Notes (Signed)
I have spent 5 minutes in review of e-visit questionnaire, review and updating patient chart, medical decision making and response to patient.   Khrystina Bonnes Cody Aurilla Coulibaly, PA-C    

## 2020-09-03 NOTE — Progress Notes (Signed)

## 2020-09-14 DIAGNOSIS — R519 Headache, unspecified: Secondary | ICD-10-CM

## 2020-09-14 HISTORY — DX: Headache, unspecified: R51.9

## 2020-09-24 ENCOUNTER — Encounter: Payer: Self-pay | Admitting: Family

## 2020-09-24 ENCOUNTER — Other Ambulatory Visit: Payer: Self-pay

## 2020-09-24 ENCOUNTER — Ambulatory Visit: Payer: Managed Care, Other (non HMO) | Admitting: Family

## 2020-09-24 ENCOUNTER — Telehealth: Payer: Managed Care, Other (non HMO) | Admitting: Family

## 2020-09-24 ENCOUNTER — Ambulatory Visit (INDEPENDENT_AMBULATORY_CARE_PROVIDER_SITE_OTHER): Payer: Managed Care, Other (non HMO) | Admitting: Family

## 2020-09-24 VITALS — BP 130/86 | HR 77 | Temp 96.4°F | Ht 67.24 in | Wt 197.2 lb

## 2020-09-24 DIAGNOSIS — G43101 Migraine with aura, not intractable, with status migrainosus: Secondary | ICD-10-CM

## 2020-09-24 MED ORDER — RIZATRIPTAN BENZOATE 10 MG PO TBDP: 10.0000 mg | ORAL_TABLET | ORAL | 0 refills | Status: DC | PRN

## 2020-09-24 MED ORDER — KETOROLAC TROMETHAMINE 60 MG/2ML IM SOLN
60.0000 mg | Freq: Once | INTRAMUSCULAR | Status: AC
  Administered 2020-09-24: 60 mg via INTRAMUSCULAR

## 2020-09-24 NOTE — Progress Notes (Signed)
Acute Office Visit  Subjective:    Patient ID: Melissa Walton, female    DOB: 09-Mar-1991, 30 y.o.   MRN: [id number]  Chief Complaint  Patient presents with  . Migraine    Pt c/o migraine x10days. Pt is prescribed topamax but it is not helping with this migraine. Pressure is over right Eye and across forehead.    HPI Patient is in today with c/o migraine headache x 10 days. She is on Topamax that works well typically. She reports headache being 8/10 at times. Pain is across her forehead and around her right eye. She has been taking Excedrin and ibuprofen that has not helped. Denies any increase in stress, caffeine. She denies blurred or double vision. Sensitive to light but not to noise. Has nausea but no vomiting. No relation to her menstrual cycles.   Past Medical History:  Diagnosis Date  . Concussion    high school    No past surgical history on file.  Family History  Problem Relation Age of Onset  . Diabetes Mother   . Hyperlipidemia Mother   . Hypertension Father   . Gout Father   . Heart disease Maternal Grandfather   . Ovarian cancer Paternal Grandmother     Social History   Socioeconomic History  . Marital status: Single    Spouse name: Not on file  . Number of children: Not on file  . Years of education: Not on file  . Highest education level: Not on file  Occupational History  . Not on file  Tobacco Use  . Smoking status: Never  . Smokeless tobacco: Never  Substance and Sexual Activity  . Alcohol use: No  . Drug use: No  . Sexual activity: Not on file  Other Topics Concern  . Not on file  Social History Narrative   Lives with parents in [place]. Dog. 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   Works at [employer], social work.    Social Determinants of Health   Financial Resource Strain: Not on file  Food Insecurity: Not on file  Transportation Needs: Not on file  Physical Activity: Not on file  Stress: Not on file   Social Connections: Not on file  Intimate Partner Violence: Not on file    Outpatient Medications Prior to Visit  Medication Sig Dispense Refill  . hydrOXYzine (ATARAX/VISTARIL) 10 MG tablet Take 1 tablet (10 mg total) by mouth at bedtime. 90 tablet 1  . pantoprazole (PROTONIX) 20 MG tablet Take 1 tablet (20 mg total) by mouth daily as needed. 90 tablet 1  . sertraline (ZOLOFT) 100 MG tablet Take 1 tablet (100 mg total) by mouth daily. 30 tablet 3  . topiramate (TOPAMAX) 50 MG tablet Take 1 tablet (50 mg total) by mouth at bedtime. 90 tablet 1  . Clotrimazole 1 % OINT Apply 1 application topically 2 (two) times daily. (Patient not taking: Reported on 09/24/2020) 56.7 g 1  . ipratropium (ATROVENT) 0.03 % nasal spray Place 2 sprays into both nostrils 3 (three) times daily as needed for rhinitis. (Patient not taking: No sig reported) 30 mL 0  . nitrofurantoin, macrocrystal-monohydrate, (MACROBID) 100 MG capsule Take 1 capsule (100 mg total) by mouth 2 (two) times daily. (Patient not taking: Reported on 09/24/2020) 10 capsule 0  . triamcinolone (KENALOG) 0.025 % cream Apply 1 application topically 2 (two) times daily. (Patient not taking: Reported on 09/24/2020) 80 g 0   No facility-administered medications prior to visit.  Allergies  Allergen Reactions  . Nsaids     GI Bleed with aleve  . Loracarbef Rash  . Penicillins Rash    Review of Systems  Constitutional: Negative.   HENT: Negative.    Eyes:  Positive for photophobia.  Respiratory: Negative.    Cardiovascular: Negative.   Gastrointestinal:  Positive for nausea. Negative for vomiting.  Endocrine: Negative.   Musculoskeletal: Negative.   Skin: Negative.   Allergic/Immunologic: Negative.   Neurological:  Positive for headaches.  Psychiatric/Behavioral: Negative.    All other systems reviewed and are negative.     Objective:    Physical Exam Vitals reviewed.  Constitutional:      Appearance: Normal appearance.  HENT:      Head: Normocephalic.     Right Ear: Tympanic membrane and ear canal normal.     Left Ear: Tympanic membrane and ear canal normal.     Nose: Nose normal.  Cardiovascular:     Rate and Rhythm: Normal rate and regular rhythm.  Pulmonary:     Effort: Pulmonary effort is normal.     Breath sounds: Normal breath sounds.  Abdominal:     Palpations: Abdomen is soft.  Musculoskeletal:        General: Normal range of motion.     Cervical back: Normal range of motion and neck supple.  Skin:    General: Skin is warm and dry.  Neurological:     General: No focal deficit present.     Mental Status: She is alert and oriented to person, place, and time. Mental status is at baseline.     Cranial Nerves: No cranial nerve deficit.     Motor: No weakness.     Gait: Gait normal.     Deep Tendon Reflexes: Reflexes normal.  Psychiatric:        Mood and Affect: Mood normal.        Behavior: Behavior normal.    BP 130/86 (BP Location: Left Arm, Patient Position: Sitting)   Pulse 77   Temp (!) 96.4 F (35.8 C)   Ht 5' 7.24" (1.708 m)   Wt 197 lb 3.2 oz (89.4 kg)   SpO2 98%   BMI 30.66 kg/m  Wt Readings from Last 3 Encounters:  09/24/20 197 lb 3.2 oz (89.4 kg)  07/09/20 206 lb 6.4 oz (93.6 kg)  04/09/20 218 lb 12.8 oz (99.2 kg)    Health Maintenance Due  Topic Date Due  . PAP-Cervical Cytology Screening  11/14/2020    There are no preventive care reminders to display for this patient.   Lab Results  Component Value Date   TSH 2.26 01/08/2020   Lab Results  Component Value Date   WBC 5.1 01/08/2020   HGB 13.2 01/08/2020   HCT 38.7 01/08/2020   MCV 93.3 01/08/2020   PLT 252.0 01/08/2020   Lab Results  Component Value Date   NA 138 01/08/2020   K 3.9 01/08/2020   CO2 26 01/08/2020   GLUCOSE 79 01/08/2020   BUN 13 01/08/2020   CREATININE 0.78 01/08/2020   BILITOT 0.4 01/08/2020   ALKPHOS 58 01/08/2020   AST 13 01/08/2020   ALT 16 01/08/2020   PROT 6.9 01/08/2020    ALBUMIN 4.5 01/08/2020   CALCIUM 9.4 01/08/2020   ANIONGAP 8 12/09/2018   GFR 104.38 01/08/2020   Lab Results  Component Value Date   CHOL 183 01/08/2020   Lab Results  Component Value Date   HDL 45.90 01/08/2020   Lab  Results  Component Value Date   LDLCALC 112 (H) 01/08/2020   Lab Results  Component Value Date   TRIG 126.0 01/08/2020   Lab Results  Component Value Date   CHOLHDL 4 01/08/2020   Lab Results  Component Value Date   HGBA1C 5.3 01/08/2020       Assessment & Plan:   Problem List Items Addressed This Visit   None Visit Diagnoses     Migraine with aura and with status migrainosus, not intractable    -  Primary   Relevant Medications   rizatriptan (MAXALT-MLT) 10 MG disintegrating tablet   ketorolac (TORADOL) injection 60 mg (Start on 09/24/2020  2:30 PM)        Meds ordered this encounter  Medications  . rizatriptan (MAXALT-MLT) 10 MG disintegrating tablet    Sig: Take 1 tablet (10 mg total) by mouth as needed for migraine. May repeat in 2 hours if needed    Dispense:  10 tablet    Refill:  0  . ketorolac (TORADOL) injection 60 mg   Plan: Toradol injection given today. Maxalt given as a rescue medication. Discussed usage. Recheck in 3 weeks to be sure Topamax dose not need adjusting.   Eulis Foster, FNP

## 2020-10-01 ENCOUNTER — Encounter: Payer: Self-pay | Admitting: Family

## 2020-10-01 ENCOUNTER — Other Ambulatory Visit: Payer: Self-pay

## 2020-10-01 ENCOUNTER — Ambulatory Visit (INDEPENDENT_AMBULATORY_CARE_PROVIDER_SITE_OTHER): Payer: Managed Care, Other (non HMO) | Admitting: Family

## 2020-10-01 VITALS — BP 102/70 | HR 88 | Temp 98.3°F | Ht 67.24 in | Wt 193.6 lb

## 2020-10-01 DIAGNOSIS — R5383 Other fatigue: Secondary | ICD-10-CM | POA: Insufficient documentation

## 2020-10-01 DIAGNOSIS — F32A Depression, unspecified: Secondary | ICD-10-CM

## 2020-10-01 DIAGNOSIS — F419 Anxiety disorder, unspecified: Secondary | ICD-10-CM

## 2020-10-01 DIAGNOSIS — R519 Headache, unspecified: Secondary | ICD-10-CM | POA: Diagnosis not present

## 2020-10-01 LAB — CBC WITH DIFFERENTIAL/PLATELET
Basophils Absolute: 0.1 10*3/uL (ref 0.0–0.1)
Basophils Relative: 2.2 % (ref 0.0–3.0)
Eosinophils Absolute: 0.1 10*3/uL (ref 0.0–0.7)
Eosinophils Relative: 1.8 % (ref 0.0–5.0)
HCT: 39.1 % (ref 36.0–46.0)
Hemoglobin: 13.3 g/dL (ref 12.0–15.0)
Lymphocytes Relative: 27.1 % (ref 12.0–46.0)
Lymphs Abs: 1.2 10*3/uL (ref 0.7–4.0)
MCHC: 33.9 g/dL (ref 30.0–36.0)
MCV: 93.8 fl (ref 78.0–100.0)
Monocytes Absolute: 0.5 10*3/uL (ref 0.1–1.0)
Monocytes Relative: 11.5 % (ref 3.0–12.0)
Neutro Abs: 2.6 10*3/uL (ref 1.4–7.7)
Neutrophils Relative %: 57.4 % (ref 43.0–77.0)
Platelets: 268 10*3/uL (ref 150.0–400.0)
RBC: 4.17 Mil/uL (ref 3.87–5.11)
RDW: 12.7 % (ref 11.5–15.5)
WBC: 4.5 10*3/uL (ref 4.0–10.5)

## 2020-10-01 LAB — COMPREHENSIVE METABOLIC PANEL
ALT: 18 U/L (ref 0–35)
AST: 14 U/L (ref 0–37)
Albumin: 4.5 g/dL (ref 3.5–5.2)
Alkaline Phosphatase: 61 U/L (ref 39–117)
BUN: 16 mg/dL (ref 6–23)
CO2: 24 mEq/L (ref 19–32)
Calcium: 9.3 mg/dL (ref 8.4–10.5)
Chloride: 104 mEq/L (ref 96–112)
Creatinine, Ser: 0.79 mg/dL (ref 0.40–1.20)
GFR: 102.27 mL/min (ref >60.00)
Glucose, Bld: 84 mg/dL (ref 70–99)
Potassium: 4.3 mEq/L (ref 3.5–5.1)
Sodium: 136 mEq/L (ref 135–145)
Total Bilirubin: 0.4 mg/dL (ref 0.2–1.2)
Total Protein: 6.9 g/dL (ref 6.0–8.3)

## 2020-10-01 LAB — B12 AND FOLATE PANEL
Folate: 12.3 ng/mL (ref >5.9)
Vitamin B-12: 188 pg/mL — ABNORMAL LOW (ref 211–911)

## 2020-10-01 LAB — TSH: TSH: 1.99 u[IU]/mL (ref 0.35–5.50)

## 2020-10-01 LAB — MONONUCLEOSIS SCREEN: Mono Screen: NEGATIVE

## 2020-10-01 LAB — VITAMIN D 25 HYDROXY (VIT D DEFICIENCY, FRACTURES): VITD: 27.76 ng/mL — ABNORMAL LOW (ref 30.00–100.00)

## 2020-10-01 MED ORDER — MAGNESIUM CITRATE 200 MG PO TABS: 400.0000 mg | ORAL_TABLET | Freq: Every day | ORAL | 1 refills | Status: DC

## 2020-10-01 MED ORDER — TOPIRAMATE 100 MG PO TABS: 100.0000 mg | ORAL_TABLET | Freq: Every day | ORAL | 1 refills | Status: DC

## 2020-10-01 MED ORDER — RIZATRIPTAN BENZOATE 10 MG PO TBDP: 10.0000 mg | ORAL_TABLET | ORAL | 0 refills | Status: DC | PRN

## 2020-10-01 NOTE — Assessment & Plan Note (Signed)
Etiology nonspecific at this time.  Suspect multifactorial.  She appears to be sleeping well, approximately 10 hours a night although sleep is not restorative.  We did have a discussion about sleep apnea.  Depression and anxiety appears well controlled.  We opted not to change the Zoloft at this time.  We will consider Effexor in the future.  Discussed increasing exercise and also the sedating agents (including Zoloft, Atarax and Topamax) that she is on.  Which could very well be contributing to underlying fatigue.  Pending thorough lab evaluation, close follow-up.  Referral to neurology for discussion of headaches, sleep apnea

## 2020-10-01 NOTE — Assessment & Plan Note (Signed)
Controlled.  Continue Zoloft 100 mg

## 2020-10-01 NOTE — Assessment & Plan Note (Signed)
Chronic, unchanged and similar to prior presentation headache.  Rates headache pain 5 out of 10 today.  We discussed taking Topamax 50 mg twice daily but she was unable to tolerate therefore we agreed for her to take 100 mg nightly.  She will continue Maxalt as needed.  We may change from Zoloft to Effexor at follow-up.  Referral to neurology.

## 2020-10-01 NOTE — Patient Instructions (Signed)
Increase Topamax to 100 mg taken at bedtime.  Please let me know how you are doing.  Referral to neurology and also GYN. Start magnesium citrate.   Let us know if you dont hear back within a week in regards to an appointment being scheduled.

## 2020-10-01 NOTE — Progress Notes (Signed)
Subjective:    Patient ID: Melissa Walton, female    DOB: [birth year], 30 y.o.   MRN: [id number]  CC: Melissa Walton is a 30 y.o. female who presents today for follow up.   HPI: Complains of ongoing, chronic daily headache . Rates pain currently 5/10. Started 3 weeks ago. Today is 'bad daily HA' , not really migraine. HA feels feel like prior HA.  Migraine are typically right sided with associated light sensitivity.  Rightsided above right eye and radiates to right maxillary area.   She has seen ENT, Dr Elenore Rota, and planning surgery 12/12/20 for deviated septum. He thought HA's may be triggered by this. ( Unable to see this record) HA is not exertional, doesn't awaken her from sleep.   No numbness, vision loss, sinus congestion, vomiting , nausea, fever.  No H/o seasonal allergies.   She has chronic neck stiffness , denies pain. She follows with chiropractor. No pain or numbness in arms.   patient was seen by colleague, Oran Rein 09/24/20 started on Maxalt 10 mg and also provided Toradol 60 mg injection with almost complete the relief however by next day, the HA returned.   She never started Topamax 50 mg twice daily is taking Topamax during the day makes her quite sleepy.  When started topamax initially and she would go 2 weeks without migraine. She reports that Maxalt 10 mg temporarily relieves the pain until it returns; she is currently run out of those. No NSAIDs.  No caffeine.  MRI brain Aug 08, 2020 was normal.  She also complains of fatigue, 6 months, worsening.  She doesn't feel refreshed from sleep. Approx 10 hours per night. No formal exercise program. She doesn't feel related to depression. She feels zoloft is helpful. Doesn't snore.  Zoloft is activating for her and she takes in the morning Atarax 10mg  qhs and she finds this helpful, not sedating.   She requests referral to establish with GYN HISTORY:  Past Medical History:  Diagnosis Date   Concussion    high  school   No past surgical history on file. Family History  Problem Relation Age of Onset   Diabetes Mother    Hyperlipidemia Mother    Hypertension Father    Gout Father    Heart disease Maternal Grandfather    Ovarian cancer Paternal Grandmother     Allergies: Nsaids, Loracarbef, and Penicillins Current Outpatient Medications on File Prior to Visit  Medication Sig Dispense Refill   hydrOXYzine (ATARAX/VISTARIL) 10 MG tablet Take 1 tablet (10 mg total) by mouth at bedtime. 90 tablet 1   pantoprazole (PROTONIX) 20 MG tablet Take 1 tablet (20 mg total) by mouth daily as needed. 90 tablet 1   sertraline (ZOLOFT) 100 MG tablet Take 1 tablet (100 mg total) by mouth daily. 30 tablet 3   [DISCONTINUED] omeprazole (PRILOSEC OTC) 20 MG tablet Take 1 tablet (20 mg total) by mouth daily. 30 tablet 6   No current facility-administered medications on file prior to visit.    Social History   Tobacco Use   Smoking status: Never   Smokeless tobacco: Never  Substance Use Topics   Alcohol use: No   Drug use: No    Review of Systems  Constitutional:  Positive for fatigue. Negative for unexpected weight change.  HENT:  Negative for congestion, ear discharge and sinus pain.   Eyes:  Negative for visual disturbance.  Respiratory:  Negative for cough and shortness of breath.   Neurological:  Positive for  headaches. Negative for numbness.  Psychiatric/Behavioral:  Negative for sleep disturbance. The patient is not nervous/anxious.      Objective:    BP 102/70 (BP Location: Left Arm, Patient Position: Sitting, Cuff Size: Large)   Pulse 88   Temp 98.3 F (36.8 C) (Oral)   Ht 5' 7.24" (1.708 m)   Wt 193 lb 9.6 oz (87.8 kg)   SpO2 98%   BMI 30.11 kg/m  BP Readings from Last 3 Encounters:  10/01/20 102/70  09/24/20 130/86  07/09/20 92/62   Wt Readings from Last 3 Encounters:  10/01/20 193 lb 9.6 oz (87.8 kg)  09/24/20 197 lb 3.2 oz (89.4 kg)  07/09/20 206 lb 6.4 oz (93.6 kg)     Physical Exam Vitals reviewed.  Constitutional:      Appearance: She is well-developed.  HENT:     Head: Normocephalic and atraumatic.     Right Ear: Hearing, tympanic membrane, ear canal and external ear normal. No swelling or tenderness. No middle ear effusion. Tympanic membrane is not erythematous or bulging.     Left Ear: Tympanic membrane, ear canal and external ear normal. No swelling or tenderness.  No middle ear effusion. Tympanic membrane is not erythematous or bulging.     Nose: Nose normal. No rhinorrhea.     Right Sinus: No maxillary sinus tenderness or frontal sinus tenderness.     Left Sinus: No maxillary sinus tenderness or frontal sinus tenderness.     Mouth/Throat:     Pharynx: Uvula midline. No posterior oropharyngeal erythema.  Eyes:     General: Lids are normal. Lids are everted, no foreign bodies appreciated.     Conjunctiva/sclera: Conjunctivae normal.     Pupils: Pupils are equal, round, and reactive to light.     Comments: Normal fundus bilaterally   Cardiovascular:     Rate and Rhythm: Normal rate and regular rhythm.     Pulses: Normal pulses.     Heart sounds: Normal heart sounds.  Pulmonary:     Effort: Pulmonary effort is normal.     Breath sounds: Normal breath sounds. No wheezing, rhonchi or rales.  Lymphadenopathy:     Head:     Right side of head: No submental, submandibular, tonsillar, preauricular, posterior auricular or occipital adenopathy.     Left side of head: No submental, submandibular, tonsillar, preauricular, posterior auricular or occipital adenopathy.     Cervical: No cervical adenopathy.     Right cervical: No superficial, deep or posterior cervical adenopathy.    Left cervical: No superficial, deep or posterior cervical adenopathy.  Skin:    General: Skin is warm and dry.  Neurological:     Mental Status: She is alert.     Cranial Nerves: No cranial nerve deficit.     Sensory: No sensory deficit.     Deep Tendon Reflexes:      Reflex Scores:      Bicep reflexes are 2+ on the right side and 2+ on the left side.      Patellar reflexes are 2+ on the right side and 2+ on the left side.    Comments: Grip equal and strong bilateral upper extremities. Gait strong and steady. Able to perform  finger-to-nose without difficulty.   Psychiatric:        Speech: Speech normal.        Behavior: Behavior normal.        Thought Content: Thought content normal.       Assessment & Plan:  Problem List Items Addressed This Visit       Other   Anxiety and depression    Controlled.  Continue Zoloft 100 mg       Fatigue    Etiology nonspecific at this time.  Suspect multifactorial.  She appears to be sleeping well, approximately 10 hours a night although sleep is not restorative.  We did have a discussion about sleep apnea.  Depression and anxiety appears well controlled.  We opted not to change the Zoloft at this time.  We will consider Effexor in the future.  Discussed increasing exercise and also the sedating agents (including Zoloft, Atarax and Topamax) that she is on.  Which could very well be contributing to underlying fatigue.  Pending thorough lab evaluation, close follow-up.  Referral to neurology for discussion of headaches, sleep apnea       Relevant Medications   Magnesium Citrate 200 MG TABS   topiramate (TOPAMAX) 100 MG tablet   Other Relevant Orders   TSH   CBC with Differential/Platelet   Comprehensive metabolic panel   VITAMIN D 25 Hydroxy (Vit-D Deficiency, Fractures)   B12 and Folate Panel   Ambulatory referral to Neurology   Ambulatory referral to Obstetrics / Gynecology   Mononucleosis screen   Headache - Primary    Chronic, unchanged and similar to prior presentation headache.  Rates headache pain 5 out of 10 today.  We discussed taking Topamax 50 mg twice daily but she was unable to tolerate therefore we agreed for her to take 100 mg nightly.  She will continue Maxalt as needed.  We may change from  Zoloft to Effexor at follow-up.  Referral to neurology.       Relevant Medications   rizatriptan (MAXALT-MLT) 10 MG disintegrating tablet   Magnesium Citrate 200 MG TABS   topiramate (TOPAMAX) 100 MG tablet   Other Relevant Orders   Ambulatory referral to Neurology   Ambulatory referral to Obstetrics / Gynecology     I have changed Melissa Walton's topiramate. I am also having her start on Magnesium Citrate. Additionally, I am having her maintain her sertraline, hydrOXYzine, pantoprazole, and rizatriptan.   Meds ordered this encounter  Medications   rizatriptan (MAXALT-MLT) 10 MG disintegrating tablet    Sig: Take 1 tablet (10 mg total) by mouth as needed for migraine. May repeat in 2 hours if needed    Dispense:  10 tablet    Refill:  0    Order Specific Question:   Supervising Provider    Answer:   Sherlene ShamsULLO, TERESA L [[id number]]   Magnesium Citrate 200 MG TABS    Sig: Take 400 mg by mouth daily.    Dispense:  120 tablet    Refill:  1    Order Specific Question:   Supervising Provider    Answer:   Duncan DullULLO, TERESA L [[id number]]   topiramate (TOPAMAX) 100 MG tablet    Sig: Take 1 tablet (100 mg total) by mouth at bedtime.    Dispense:  90 tablet    Refill:  1    Order Specific Question:   Supervising Provider    Answer:   Sherlene ShamsULLO, TERESA L [[id number]]     Return precautions given.   Risks, benefits, and alternatives of the medications and treatment plan prescribed today were discussed, and patient expressed understanding.   Education regarding symptom management and diagnosis given to patient on AVS.  Continue to follow with Allegra GranaArnett, Dealie Koelzer G, FNP for routine health maintenance.   Shanai  Joaquin Bend and I agreed with plan.   Rennie Plowman, FNP

## 2020-10-02 ENCOUNTER — Telehealth: Payer: Self-pay | Admitting: *Deleted

## 2020-10-02 ENCOUNTER — Telehealth: Payer: Self-pay | Admitting: Family

## 2020-10-02 ENCOUNTER — Other Ambulatory Visit: Payer: Self-pay | Admitting: Family

## 2020-10-02 DIAGNOSIS — E538 Deficiency of other specified B group vitamins: Secondary | ICD-10-CM

## 2020-10-02 NOTE — Telephone Encounter (Signed)
Melissa Walton, How soon can pt see neurology?  [name], do you mind calling even to see if any cancellations?   Spoke with pt  She is leaving work  HA is 8/10. Feels similar to HA to HA in the past.  Endorses nausea, light sensitivity. She started topamax 100mg  last night. She hasnt taken maxalt yoday.  No fever, neck stiffness, cough, vision loss, facial numbness.  She would like to try maxalt first and if no relief, she will go to ED.

## 2020-10-02 NOTE — Telephone Encounter (Signed)
Opened in Error.

## 2020-10-02 NOTE — Telephone Encounter (Signed)
Patient seen on 10/01/20 , stated the light was shined in her eyes and this has setoff a migraine pain rated at 8 on a scale 0 no pain and 10 worst patient has taken Topamax 100 mg twice with no relief, patient has no Rizatriptan 10 mg disintegrating tablet to try. Please advise no appointments available.

## 2020-10-02 NOTE — Telephone Encounter (Signed)
Transfer to Melissa Walton at [facility] Nurse and advise no apts in office or virtually  PT called to advise she has a ongoing severe migraine. PT states she got a shot before but advise no apts available and transfer to Access Nurse for further assistance.

## 2020-10-02 NOTE — Telephone Encounter (Signed)
Called to check on patient.  Also left her message that we were able to schedule neurology appointment and wanted to make sure that she had heard from our office in regards to this.  Advised her to give [place] call back with any questions or concerns

## 2020-10-03 ENCOUNTER — Telehealth: Payer: Self-pay

## 2020-10-03 NOTE — Progress Notes (Signed)
NEUROLOGY CONSULTATION NOTE  Melissa Walton MRN: [id number] DOB: 07/14/1990  Referring provider: Rennie Plowman, FNP Primary care provider: Rennie Plowman, FNP  Reason for consult:  headache  Assessment/Plan:   Migraine without aura, with status migrainosus, intractable  Migraine prevention:  start Aimovig 140mg  Q28d; continue topiramate 100mg  at bedtime Migraine rescue:  sumatriptan 100mg  with Zofran ODT 4mg  Limit use of pain relievers to no more than 2 days out of week to prevent risk of rebound or medication-overuse headache. Keep headache diary Follow up 6 months.    Subjective:  Melissa Walton is a 30 year old right-handed female who presents for headaches.  History supplemented by ED and referring provider's notes.  Migraines started 2018 and gradually got worse.  They are severe bifrontal/retroorbital/occipital pounding pain associated with nausea, photophobia, phonophobia, dizziness but no visual disturbance, vomiting, autonomic symptoms, numbness and weakness.  They would last 3-4 days with only one day of headache-freedom until next migraine.  She was started on topiramate a few months ago.  Headaches significantly improved - they were less severe, would last less than a day and occur once a week.  In June, she had a an intractable migraine lasting a week.  She had a second intractable migraine beginning July 2.  She received a Toradol shot at her PCP's office which was ineffective.  Maxalt helped ease it but did not break it.  She was seen in the ED on 10/02/2020 where she received a migraine cocktail followed by Haldol.  Headache broke.  She now has a lingering headache.  MRI of brain with and without contrast on 08/08/2020 personally reviewed was normal.   Current NSAIDS/analgesics:  none Current triptans:  Maxalt-MLT 10mg  Current ergotamine:  none Current anti-emetic:  none Current muscle relaxants:  none Current Antihypertensive medications:  none Current  Antidepressant medications:  Sertraline 100mg  QD Current Anticonvulsant medications:  topiramate 100mg  QHS Current anti-CGRP:  none Current Vitamins/Herbal/Supplements:  magnesium citrate 400mg  QD Current Antihistamines/Decongestants:  hydroxyzine Other therapy:  none Hormone/birth control:  none   Past NSAIDS/analgesics:  Excedrin Migraine Past abortive triptans:  none Past abortive ergotamine:  none Past muscle relaxants:  Flexeril Past anti-emetic:  none Past antihypertensive medications:  none Past antidepressant medications:  none Past anticonvulsant medications:  none Past anti-CGRP:  none Past vitamins/Herbal/Supplements:  none Past antihistamines/decongestants:  Zyrtec, Flonase Other past therapies:  none  Caffeine:  no coffee.  Soda every now and then Diet:  Does not drink enough water.  Skips meals Exercise:  no Depression:  controlled; Anxiety:  cpntrolled Other pain:  hip pain Sleep hygiene:  wakes up during the night Family history of headache:  brother      PAST MEDICAL HISTORY: Past Medical History:  Diagnosis Date   Concussion    high school    PAST SURGICAL HISTORY: No past surgical history on file.  MEDICATIONS: Current Outpatient Medications on File Prior to Visit  Medication Sig Dispense Refill   hydrOXYzine (ATARAX/VISTARIL) 10 MG tablet Take 1 tablet (10 mg total) by mouth at bedtime. 90 tablet 1   Magnesium Citrate 200 MG TABS Take 400 mg by mouth daily. 120 tablet 1   pantoprazole (PROTONIX) 20 MG tablet Take 1 tablet (20 mg total) by mouth daily as needed. 90 tablet 1   rizatriptan (MAXALT-MLT) 10 MG disintegrating tablet Take 1 tablet (10 mg total) by mouth as needed for migraine. May repeat in 2 hours if needed 10 tablet 0   sertraline (ZOLOFT) 100 MG  tablet Take 1 tablet (100 mg total) by mouth daily. 30 tablet 3   topiramate (TOPAMAX) 100 MG tablet Take 1 tablet (100 mg total) by mouth at bedtime. 90 tablet 1   [DISCONTINUED] omeprazole  (PRILOSEC OTC) 20 MG tablet Take 1 tablet (20 mg total) by mouth daily. 30 tablet 6   No current facility-administered medications on file prior to visit.    ALLERGIES: Allergies  Allergen Reactions   Nsaids     GI Bleed with aleve   Loracarbef Rash   Penicillins Rash    FAMILY HISTORY: Family History  Problem Relation Age of Onset   Diabetes Mother    Hyperlipidemia Mother    Hypertension Father    Gout Father    Heart disease Maternal Grandfather    Ovarian cancer Paternal Grandmother     Objective:  Blood pressure 121/78, pulse (!) 113, height 5\' 7"  (1.702 m), weight 190 lb 4 oz (86.3 kg), SpO2 98 %. General: No acute distress.  Patient appears well-groomed.   Head:  Normocephalic/atraumatic Eyes:  fundi examined but not visualized Neck: supple, no paraspinal tenderness, full range of motion Back: No paraspinal tenderness Heart: regular rate and rhythm Lungs: Clear to auscultation bilaterally. Vascular: No carotid bruits. Neurological Exam: Mental status: alert and oriented to person, place, and time, recent and remote memory intact, fund of knowledge intact, attention and concentration intact, speech fluent and not dysarthric, language intact. Cranial nerves: CN I: not tested CN II: pupils equal, round and reactive to light, visual fields intact CN III, IV, VI:  full range of motion, no nystagmus, no ptosis CN V: facial sensation intact. CN VII: upper and lower face symmetric CN VIII: hearing intact CN IX, X: gag intact, uvula midline CN XI: sternocleidomastoid and trapezius muscles intact CN XII: tongue midline Bulk & Tone: normal, no fasciculations. Motor:  muscle strength 5/5 throughout Sensation:  Pinprick, temperature and vibratory sensation intact. Deep Tendon Reflexes:  2+ throughout,  toes downgoing.   Finger to nose testing:  Without dysmetria.   Heel to shin:  Without dysmetria.   Gait:  Normal station and stride.  Romberg negative.    Thank you  for allowing me to take part in the care of this patient.  , DO  CC: Shon Millet, FNP

## 2020-10-03 NOTE — Telephone Encounter (Signed)
LMTCB for labs results.

## 2020-10-04 ENCOUNTER — Ambulatory Visit (INDEPENDENT_AMBULATORY_CARE_PROVIDER_SITE_OTHER): Payer: Managed Care, Other (non HMO) | Admitting: Neurology

## 2020-10-04 ENCOUNTER — Encounter: Payer: Self-pay | Admitting: Neurology

## 2020-10-04 ENCOUNTER — Other Ambulatory Visit: Payer: Self-pay

## 2020-10-04 VITALS — BP 121/78 | HR 113 | Ht 67.0 in | Wt 190.2 lb

## 2020-10-04 DIAGNOSIS — G43011 Migraine without aura, intractable, with status migrainosus: Secondary | ICD-10-CM

## 2020-10-04 MED ORDER — SUMATRIPTAN SUCCINATE 100 MG PO TABS: ORAL_TABLET | ORAL | 5 refills | Status: DC

## 2020-10-04 MED ORDER — ONDANSETRON 4 MG PO TBDP: 4.0000 mg | ORAL_TABLET | Freq: Three times a day (TID) | ORAL | 5 refills | Status: DC | PRN

## 2020-10-04 MED ORDER — AIMOVIG 140 MG/ML ~~LOC~~ SOAJ: 140.0000 mg | SUBCUTANEOUS | 5 refills | Status: DC

## 2020-10-04 NOTE — Patient Instructions (Signed)
  Start Aimovig 140mg  every 28 days.  Contact insurance to see if this medication is covered, as well as Emgality or Ajovy Continue topiramate for now Take sumatriptan 100mg  at earliest onset of headache.  May repeat dose once in 2 hours if needed.  Maximum 2 tablets in 24 hours. Take ondansetron for nausea Limit use of pain relievers to no more than 2 days out of the week.  These medications include acetaminophen, NSAIDs (ibuprofen/Advil/Motrin, naproxen/Aleve, triptans (Imitrex/sumatriptan), Excedrin, and narcotics.  This will help reduce risk of rebound headaches. Be aware of common food triggers:  - Caffeine:  coffee, black tea, cola, Mt. Dew  - Chocolate  - Dairy:  aged cheeses (brie, blue, cheddar, gouda, [place], provolone, [place], Swiss, etc), chocolate milk, buttermilk, sour cream, limit eggs and yogurt  - Nuts, peanut butter  - Alcohol  - Cereals/grains:  FRESH breads (fresh bagels, sourdough, doughnuts), yeast productions  - Processed/canned/aged/cured meats (pre-packaged deli meats, hotdogs)  - MSG/glutamate:  soy sauce, flavor enhancer, pickled/preserved/marinated foods  - Sweeteners:  aspartame (Equal, Nutrasweet).  Sugar and Splenda are okay  - Vegetables:  legumes (lima beans, lentils, snow peas, fava beans, pinto peans, peas, garbanzo beans), sauerkraut, onions, olives, pickles  - Fruit:  avocados, bananas, citrus fruit (orange, lemon, grapefruit), mango  - Other:  Frozen meals, macaroni and cheese Routine exercise Stay adequately hydrated (aim for 64 oz water daily) Keep headache diary Maintain proper stress management Maintain proper sleep hygiene Do not skip meals Consider supplements:  magnesium citrate 400mg  daily, riboflavin 400mg  daily, coenzyme Q10 100mg  three times daily.

## 2020-10-07 NOTE — Progress Notes (Signed)
  Aimovig 140mg  called insurance [phone], clinical review reference number [id number], [place], pending review, they will contact office back.

## 2020-10-08 ENCOUNTER — Ambulatory Visit (INDEPENDENT_AMBULATORY_CARE_PROVIDER_SITE_OTHER): Payer: Managed Care, Other (non HMO)

## 2020-10-08 ENCOUNTER — Other Ambulatory Visit: Payer: Self-pay

## 2020-10-08 ENCOUNTER — Ambulatory Visit: Payer: Managed Care, Other (non HMO)

## 2020-10-08 DIAGNOSIS — E538 Deficiency of other specified B group vitamins: Secondary | ICD-10-CM

## 2020-10-08 MED ORDER — CYANOCOBALAMIN 1000 MCG/ML IJ SOLN
1000.0000 ug | Freq: Once | INTRAMUSCULAR | Status: AC
  Administered 2020-10-08: 1000 ug via INTRAMUSCULAR

## 2020-10-08 NOTE — Progress Notes (Signed)
Patient presented for B 12 injection to left deltoid, patient voiced no concerns nor showed any signs of distress during injection. 

## 2020-10-14 ENCOUNTER — Ambulatory Visit: Payer: Managed Care, Other (non HMO) | Admitting: Dermatology

## 2020-10-14 NOTE — Progress Notes (Signed)
Approval is effective 10/07/2020 through 04/16/2021.

## 2020-10-15 ENCOUNTER — Other Ambulatory Visit (INDEPENDENT_AMBULATORY_CARE_PROVIDER_SITE_OTHER): Payer: Managed Care, Other (non HMO)

## 2020-10-15 ENCOUNTER — Other Ambulatory Visit: Payer: Self-pay

## 2020-10-15 ENCOUNTER — Ambulatory Visit (INDEPENDENT_AMBULATORY_CARE_PROVIDER_SITE_OTHER): Payer: Managed Care, Other (non HMO)

## 2020-10-15 DIAGNOSIS — E538 Deficiency of other specified B group vitamins: Secondary | ICD-10-CM | POA: Diagnosis not present

## 2020-10-15 LAB — B12 AND FOLATE PANEL
Folate: 12.6 ng/mL (ref >5.9)
Vitamin B-12: 337 pg/mL (ref 211–911)

## 2020-10-15 MED ORDER — CYANOCOBALAMIN 1000 MCG/ML IJ SOLN
1000.0000 ug | Freq: Once | INTRAMUSCULAR | Status: AC
  Administered 2020-10-15: 1000 ug via INTRAMUSCULAR

## 2020-10-15 NOTE — Progress Notes (Signed)
Patient presented for B 12 injection to left deltoid, patient voiced no concerns nor showed any signs of distress during injection. 

## 2020-10-15 NOTE — Addendum Note (Signed)
Addended by: Warden Fillers on: 10/15/2020 11:01 AM   Modules accepted: Orders

## 2020-10-15 NOTE — Addendum Note (Signed)
Addended by: Jkwon Treptow S on: 10/15/2020 11:01 AM   Modules accepted: Orders  

## 2020-10-18 ENCOUNTER — Other Ambulatory Visit: Payer: Self-pay | Admitting: Family

## 2020-10-18 ENCOUNTER — Encounter: Payer: Self-pay | Admitting: Family

## 2020-10-18 ENCOUNTER — Other Ambulatory Visit: Payer: Self-pay

## 2020-10-18 DIAGNOSIS — E538 Deficiency of other specified B group vitamins: Secondary | ICD-10-CM | POA: Insufficient documentation

## 2020-10-18 LAB — METHYLMALONIC ACID, SERUM: Methylmalonic Acid: 107 nmol/L (ref 0–378)

## 2020-10-18 LAB — INTRINSIC FACTOR ANTIBODIES: Intrinsic Factor Abs, Serum: 1 AU/mL (ref 0.0–1.1)

## 2020-10-18 LAB — CELIAC DISEASE AB SCREEN W/RFX
Antigliadin Abs, IgA: 4 units (ref 0–19)
IgA/Immunoglobulin A, Serum: 208 mg/dL (ref 87–352)
Transglutaminase IgA: 2 U/mL (ref 0–3)

## 2020-10-18 LAB — ANTI-PARIETAL ANTIBODY: Parietal Cell Ab: 2.6 Units (ref 0.0–20.0)

## 2020-10-18 LAB — HOMOCYSTEINE: Homocysteine: 9.2 umol/L (ref 0.0–14.5)

## 2020-10-18 MED ORDER — AIMOVIG 140 MG/ML ~~LOC~~ SOAJ
140.0000 mg | SUBCUTANEOUS | 5 refills | Status: DC
Start: 2020-10-18 — End: 2020-10-31

## 2020-10-18 NOTE — Telephone Encounter (Signed)
Per pt pharmacy they never received script for aimovig.   New script sent today.

## 2020-10-22 ENCOUNTER — Other Ambulatory Visit: Payer: Self-pay

## 2020-10-22 ENCOUNTER — Ambulatory Visit (INDEPENDENT_AMBULATORY_CARE_PROVIDER_SITE_OTHER): Payer: Managed Care, Other (non HMO)

## 2020-10-22 DIAGNOSIS — E538 Deficiency of other specified B group vitamins: Secondary | ICD-10-CM | POA: Diagnosis not present

## 2020-10-22 MED ORDER — CYANOCOBALAMIN 1000 MCG/ML IJ SOLN
1000.0000 ug | Freq: Once | INTRAMUSCULAR | Status: AC
  Administered 2020-10-22: 1000 ug via INTRAMUSCULAR

## 2020-10-22 NOTE — Progress Notes (Signed)
Patient presented for B 12 injection to left deltoid, patient voiced no concerns nor showed any signs of distress during injection. 

## 2020-10-23 ENCOUNTER — Encounter: Payer: Self-pay | Admitting: Otolaryngology

## 2020-10-23 ENCOUNTER — Other Ambulatory Visit: Payer: Self-pay

## 2020-10-29 ENCOUNTER — Ambulatory Visit: Payer: Managed Care, Other (non HMO)

## 2020-10-29 NOTE — Discharge Instructions (Signed)
[facility] ENDOSCOPIC SINUS SURGERY [facility]  What is Functional Endoscopic Sinus Surgery?  The Surgery involves making the natural openings of the sinuses larger by removing the bony partitions that separate the sinuses from the nasal cavity.  The natural sinus lining is preserved as much as possible to allow the sinuses to resume normal function after the surgery.  In some patients nasal polyps (excessively swollen lining of the sinuses) may be removed to relieve obstruction of the sinus openings.  The surgery is performed through the nose using lighted scopes, which eliminates the need for incisions on the face.  A septoplasty is a different procedure which is sometimes performed with sinus surgery.  It involves straightening the boy partition that separates the two sides of your nose.  A crooked or deviated septum may need repair if is obstructing the sinuses or nasal airflow.  Turbinate reduction is also often performed during sinus surgery.  The turbinates are bony proturberances from the side walls of the nose which swell and can obstruct the nose in patients with sinus and allergy problems.  Their size can be surgically reduced to help relieve nasal obstruction.  What Can Sinus Surgery Do For Me?  Sinus surgery can reduce the frequency of sinus infections requiring antibiotic treatment.  This can provide improvement in nasal congestion, post-nasal drainage, facial pressure and nasal obstruction.  Surgery will NOT prevent you from ever having an infection again, so it usually only for patients who get infections 4 or more times yearly requiring antibiotics, or for infections that do not clear with antibiotics.  It will not cure nasal allergies, so patients with allergies may still require medication to treat their allergies after surgery. Surgery may improve headaches related to sinusitis, however, some people will continue to  require medication to control sinus headaches related to allergies.  Surgery will do nothing for other forms of headache (migraine, tension or cluster).  What Are the Risks of Endoscopic Sinus Surgery?  Current techniques allow surgery to be performed safely with little risk, however, there are rare complications that patients should be aware of.  Because the sinuses are located around the eyes, there is risk of eye injury, including blindness, though again, this would be quite rare. This is usually a result of bleeding behind the eye during surgery, which puts the vision oat risk, though there are treatments to protect the vision and prevent permanent disrupted by surgery causing a leak of the spinal fluid that surrounds the brain.  More serious complications would include bleeding inside the brain cavity or damage to the brain.  Again, all of these complications are uncommon, and spinal fluid leaks can be safely managed surgically if they occur.  The most common complication of sinus surgery is bleeding from the nose, which may require packing or cauterization of the nose.  Continued sinus have polyps may experience recurrence of the polyps requiring revision surgery.  Alterations of sense of smell or injury to the tear ducts are also rare complications.   What is the Surgery Like, and what is the Recovery?  The Surgery usually takes a couple of hours to perform, and is usually performed under a general anesthetic (completely asleep).  Patients are usually discharged home after a couple of hours.  Sometimes during surgery it is necessary to pack the nose to control bleeding, and the packing is left in place for 24 - 48 hours, and removed by your surgeon.    If a septoplasty was performed during the procedure, there is often a splint placed which must be removed after 5-7 days.   Discomfort: Pain is usually mild to moderate, and can be controlled by prescription pain medication or acetaminophen (Tylenol).   Aspirin, Ibuprofen (Advil, Motrin), or Naprosyn (Aleve) should be avoided, as they can cause increased bleeding.  Most patients feel sinus pressure like they have a bad head cold for several days.  Sleeping with your head elevated can help reduce swelling and facial pressure, as can ice packs over the face.  A humidifier may be helpful to keep the mucous and blood from drying in the nose.   Diet: There are no specific diet restrictions, however, you should generally start with clear liquids and a light diet of bland foods because the anesthetic can cause some nausea.  Advance your diet depending on how your stomach feels.  Taking your pain medication with food will often help reduce stomach upset which pain medications can cause.  Nasal Saline Irrigation: It is important to remove blood clots and dried mucous from the nose as it is healing.  This is done by having you irrigate the nose at least 3 - 4 times daily with a salt water solution.  We recommend using NeilMed Sinus Rinse (available at the drug store).  Fill the squeeze bottle with the solution, bend over a sink, and insert the tip of the squeeze bottle into the nose  of an inch.  Point the tip of the squeeze bottle towards the inside corner of the eye on the same side your irrigating.  Squeeze the bottle and gently irrigate the nose.  If you bend forward as you do this, most of the fluid will flow back out of the nose, instead of down your throat.   The solution should be warm, near body temperature, when you irrigate.   Each time you irrigate, you should use a full squeeze bottle.   Note that if you are instructed to use Nasal Steroid Sprays at any time after your surgery, irrigate with saline BEFORE using the steroid spray, so you do not wash it all out of the nose. Another product, Nasal Saline Gel (such as AYR Nasal Saline Gel) can be applied in each nostril 3 - 4 times daily to moisture the nose and reduce scabbing or crusting.  Bleeding:   Bloody drainage from the nose can be expected for several days, and patients are instructed to irrigate their nose frequently with salt water to help remove mucous and blood clots.  The drainage may be dark red or brown, though some fresh blood may be seen intermittently, especially after irrigation.  Do not blow you nose, as bleeding may occur. If you must sneeze, keep your mouth open to allow air to escape through your mouth.  If heavy bleeding occurs: Irrigate the nose with saline to rinse out clots, then spray the nose 3 - 4 times with Afrin Nasal Decongestant Spray.  The spray will constrict the blood vessels to slow bleeding.  Pinch the lower half of your nose shut to apply pressure, and lay down with your head elevated.  Ice packs over the nose may help as well. If bleeding persists despite these measures, you should notify your doctor.  Do not use the Afrin routinely to control nasal congestion after surgery, as it can result in worsening congestion and may affect healing.     Activity: Return to work varies among patients. Most patients will be   out of work at least 5 - 7 days to recover.  Patient may return to work after they are off of narcotic pain medication, and feeling well enough to perform the functions of their job.  Patients must avoid heavy lifting (over 10 pounds) or strenuous physical for 2 weeks after surgery, so your employer may need to assign you to light duty, or keep you out of work longer if light duty is not possible.  NOTE: you should not drive, operate dangerous machinery, do any mentally demanding tasks or make any important legal or financial decisions while on narcotic pain medication and recovering from the general anesthetic.    Call Your Doctor Immediately if You Have Any of the Following: Bleeding that you cannot control with the above measures Loss of vision, double vision, bulging of the eye or black eyes. Fever over 101 degrees Neck stiffness with severe headache,  fever, nausea and change in mental state. You are always encourage to call anytime with concerns, however, please call with requests for pain medication refills during office hours.  Office Endoscopy: During follow-up visits your doctor will remove any packing or splints that may have been placed and evaluate and clean your sinuses endoscopically.  Topical anesthetic will be used to make this as comfortable as possible, though you may want to take your pain medication prior to the visit.  How often this will need to be done varies from patient to patient.  After complete recovery from the surgery, you may need follow-up endoscopy from time to time, particularly if there is concern of recurrent infection or nasal polyps.   

## 2020-10-30 ENCOUNTER — Other Ambulatory Visit: Payer: Self-pay | Admitting: Family

## 2020-10-31 ENCOUNTER — Encounter
Admission: RE | Disposition: A | Discharge status: Home / Self Care | Payer: Self-pay | Source: Home / Self Care | Attending: Otolaryngology

## 2020-10-31 ENCOUNTER — Ambulatory Visit
Admission: RE | Admit: 2020-10-31 | Discharge: 2020-10-31 | Disposition: A | Discharge status: Home / Self Care | Payer: Managed Care, Other (non HMO) | Attending: Otolaryngology | Admitting: Otolaryngology

## 2020-10-31 ENCOUNTER — Ambulatory Visit: Payer: Managed Care, Other (non HMO) | Admitting: Anesthesiology

## 2020-10-31 ENCOUNTER — Other Ambulatory Visit: Payer: Self-pay

## 2020-10-31 ENCOUNTER — Encounter: Payer: Self-pay | Admitting: Otolaryngology

## 2020-10-31 DIAGNOSIS — Z79899 Other long term (current) drug therapy: Secondary | ICD-10-CM | POA: Diagnosis not present

## 2020-10-31 DIAGNOSIS — J342 Deviated nasal septum: Secondary | ICD-10-CM | POA: Diagnosis present

## 2020-10-31 DIAGNOSIS — Z888 Allergy status to other drugs, medicaments and biological substances status: Secondary | ICD-10-CM | POA: Insufficient documentation

## 2020-10-31 DIAGNOSIS — J343 Hypertrophy of nasal turbinates: Secondary | ICD-10-CM | POA: Insufficient documentation

## 2020-10-31 HISTORY — DX: Motion sickness, initial encounter: T75.3XXA

## 2020-10-31 HISTORY — DX: Migraine, unspecified, not intractable, without status migrainosus: G43.909

## 2020-10-31 HISTORY — DX: Family history of other specified conditions: Z84.89

## 2020-10-31 HISTORY — PX: SEPTOPLASTY: SHX2393

## 2020-10-31 HISTORY — DX: Gastro-esophageal reflux disease without esophagitis: K21.9

## 2020-10-31 HISTORY — PX: TURBINATE REDUCTION: SHX6157

## 2020-10-31 HISTORY — DX: Other complications of anesthesia, initial encounter: T88.59XA

## 2020-10-31 HISTORY — DX: Anxiety disorder, unspecified: F41.9

## 2020-10-31 LAB — POCT PREGNANCY, URINE: Preg Test, Ur: NEGATIVE

## 2020-10-31 SURGERY — SEPTOPLASTY, NOSE
Anesthesia: General | Site: Nose | Laterality: Bilateral

## 2020-10-31 MED ORDER — SCOPOLAMINE 1 MG/3DAYS TD PT72
1.0000 | MEDICATED_PATCH | TRANSDERMAL | Status: DC
  Administered 2020-10-31: 1.5 mg via TRANSDERMAL

## 2020-10-31 MED ORDER — LACTATED RINGERS IV SOLN: INTRAVENOUS | Status: DC

## 2020-10-31 MED ORDER — CEPHALEXIN 500 MG PO CAPS: 500.0000 mg | ORAL_CAPSULE | Freq: Two times a day (BID) | ORAL | 0 refills | Status: DC

## 2020-10-31 MED ORDER — GLYCOPYRROLATE 0.2 MG/ML IJ SOLN
INTRAMUSCULAR | Status: DC | PRN
  Administered 2020-10-31: .1 mg via INTRAVENOUS

## 2020-10-31 MED ORDER — FENTANYL CITRATE PF 50 MCG/ML IJ SOSY: 25.0000 ug | PREFILLED_SYRINGE | INTRAMUSCULAR | Status: DC | PRN

## 2020-10-31 MED ORDER — LIDOCAINE HCL (CARDIAC) PF 100 MG/5ML IV SOSY
PREFILLED_SYRINGE | INTRAVENOUS | Status: DC | PRN
  Administered 2020-10-31: 50 mg via INTRAVENOUS

## 2020-10-31 MED ORDER — DEXAMETHASONE SODIUM PHOSPHATE 4 MG/ML IJ SOLN
INTRAMUSCULAR | Status: DC | PRN
  Administered 2020-10-31: 10 mg via INTRAVENOUS

## 2020-10-31 MED ORDER — PROMETHAZINE HCL 25 MG/ML IJ SOLN: 6.2500 mg | INTRAMUSCULAR | Status: DC | PRN

## 2020-10-31 MED ORDER — FENTANYL CITRATE (PF) 100 MCG/2ML IJ SOLN
INTRAMUSCULAR | Status: DC | PRN
  Administered 2020-10-31: 50 ug via INTRAVENOUS

## 2020-10-31 MED ORDER — LIDOCAINE-EPINEPHRINE 1 %-1:100000 IJ SOLN
INTRAMUSCULAR | Status: DC | PRN
  Administered 2020-10-31: 6 mL

## 2020-10-31 MED ORDER — PROPOFOL 10 MG/ML IV BOLUS
INTRAVENOUS | Status: DC | PRN
  Administered 2020-10-31: 200 mg via INTRAVENOUS

## 2020-10-31 MED ORDER — HYDROCODONE-ACETAMINOPHEN 5-325 MG PO TABS: 1.0000 | ORAL_TABLET | ORAL | 0 refills | Status: AC | PRN

## 2020-10-31 MED ORDER — PHENYLEPHRINE HCL 0.5 % NA SOLN
NASAL | Status: DC | PRN
  Administered 2020-10-31: 30 mL via NASAL

## 2020-10-31 MED ORDER — OXYCODONE HCL 5 MG/5ML PO SOLN
5.0000 mg | Freq: Once | ORAL | Status: AC | PRN
Start: 2020-10-31 — End: 2020-10-31
  Administered 2020-10-31: 5 mg via ORAL

## 2020-10-31 MED ORDER — OXYCODONE HCL 5 MG PO TABS: 5.0000 mg | ORAL_TABLET | Freq: Once | ORAL | Status: AC | PRN

## 2020-10-31 MED ORDER — MIDAZOLAM HCL 5 MG/5ML IJ SOLN
INTRAMUSCULAR | Status: DC | PRN
  Administered 2020-10-31: 2 mg via INTRAVENOUS

## 2020-10-31 MED ORDER — SUCCINYLCHOLINE CHLORIDE 200 MG/10ML IV SOSY
PREFILLED_SYRINGE | INTRAVENOUS | Status: DC | PRN
  Administered 2020-10-31: 100 mg via INTRAVENOUS

## 2020-10-31 MED ORDER — CEFAZOLIN SODIUM-DEXTROSE 2-4 GM/100ML-% IV SOLN
2.0000 g | Freq: Once | INTRAVENOUS | Status: AC
  Administered 2020-10-31: 2 g via INTRAVENOUS

## 2020-10-31 MED ORDER — ACETAMINOPHEN 10 MG/ML IV SOLN
1000.0000 mg | Freq: Once | INTRAVENOUS | Status: AC
  Administered 2020-10-31: 1000 mg via INTRAVENOUS

## 2020-10-31 MED ORDER — ONDANSETRON HCL 4 MG/2ML IJ SOLN
INTRAMUSCULAR | Status: DC | PRN
  Administered 2020-10-31: 4 mg via INTRAVENOUS

## 2020-10-31 MED ORDER — PREDNISONE 10 MG PO TABS: ORAL_TABLET | ORAL | 0 refills | Status: DC

## 2020-10-31 MED ORDER — OXYMETAZOLINE HCL 0.05 % NA SOLN
2.0000 | Freq: Once | NASAL | Status: AC
  Administered 2020-10-31: 2 via NASAL

## 2020-10-31 MED ORDER — MEPERIDINE HCL 25 MG/ML IJ SOLN: 6.2500 mg | INTRAMUSCULAR | Status: DC | PRN

## 2020-10-31 SURGICAL SUPPLY — 23 items
CANISTER SUCT 1200ML W/VALVE (MISCELLANEOUS) ×2 IMPLANT
COAGULATOR SUCT 8FR VV (MISCELLANEOUS) ×2 IMPLANT
ELECT REM PT RETURN 9FT ADLT (ELECTROSURGICAL) ×2
ELECTRODE REM PT RTRN 9FT ADLT (ELECTROSURGICAL) ×1 IMPLANT
GAUZE 4X4 16PLY ~~LOC~~+RFID DBL (SPONGE) ×1 IMPLANT
GLOVE SURG GAMMEX PI TX LF 7.5 (GLOVE) ×4 IMPLANT
GOWN STRL REUS W/ TWL LRG LVL3 (GOWN DISPOSABLE) ×1 IMPLANT
GOWN STRL REUS W/TWL LRG LVL3 (GOWN DISPOSABLE) ×2
KIT TURNOVER KIT A (KITS) ×2 IMPLANT
NDL HYPO 27GX1-1/4 (NEEDLE) ×1 IMPLANT
NEEDLE HYPO 27GX1-1/4 (NEEDLE) ×2 IMPLANT
PACK ENT CUSTOM (PACKS) ×2 IMPLANT
PATTIES SURGICAL .5 X3 (DISPOSABLE) ×2 IMPLANT
SPLINT NASAL SEPTAL BLV .50 ST (MISCELLANEOUS) ×2 IMPLANT
STRAP BODY AND KNEE 60X3 (MISCELLANEOUS) ×2 IMPLANT
SUT CHROMIC 3-0 (SUTURE) ×2
SUT CHROMIC 3-0 KS 27XMFL CR (SUTURE) ×1
SUT ETHILON 3-0 KS 30 BLK (SUTURE) ×2 IMPLANT
SUT PLAIN GUT 4-0 (SUTURE) ×2 IMPLANT
SUTURE CHRMC 3-0 KS 27XMFL CR (SUTURE) IMPLANT
SYR 3ML LL SCALE MARK (SYRINGE) ×2 IMPLANT
TOWEL OR 17X26 4PK STRL BLUE (TOWEL DISPOSABLE) ×2 IMPLANT
WATER STERILE IRR 250ML POUR (IV SOLUTION) ×2 IMPLANT

## 2020-10-31 NOTE — H&P (Signed)
H&P has been reviewed and patient reevaluated, no changes necessary. To be downloaded later.  

## 2020-10-31 NOTE — Transfer of Care (Signed)
Immediate Anesthesia Transfer of Care Note  Patient: Melissa Walton  Procedure(s) Performed: SEPTOPLASTY (Bilateral: Nose) TURBINATE REDUCTION (Bilateral: Nose)  Patient Location: PACU  Anesthesia Type: General  Level of Consciousness: awake, alert  and patient cooperative  Airway and Oxygen Therapy: Patient Spontanous Breathing and Patient connected to supplemental oxygen  Post-op Assessment: Post-op Vital signs reviewed, Patient's Cardiovascular Status Stable, Respiratory Function Stable, Patent Airway and No signs of Nausea or vomiting  Post-op Vital Signs: Reviewed and stable  Complications: No notable events documented.

## 2020-10-31 NOTE — Op Note (Signed)
10/31/2020  9:34 AM  [id number]   Pre-Op Dx:  Deviated Nasal Septum, Hypertrophic Inferior Turbinates  Post-op Dx: Same  Proc: Nasal Septoplasty, Bilateral Partial Reduction Inferior Turbinates   Surg:  Melissa Walton Kasean Denherder  Anes:  GOT  EBL: 40 mL  Comp: None  Findings: Anterior septum was buckled on itself and accordioned such that it had a large cartilaginous spur on the right side and a smaller one on the left.  The maxillary crest was overhanging on the right side and into the nasal airway pinching the inferior turbinate.  Procedure: With the patient in a comfortable supine position,  general orotracheal anesthesia was induced without difficulty.     The patient received preoperative Afrin spray for topical decongestion and vasoconstriction.  Intravenous prophylactic antibiotics were administered.  At an appropriate level, the patient was placed in a semi-sitting position.  Nasal vibrissae were trimmed.   1% Xylocaine with 1:100,000 epinephrine, 6 cc's, was infiltrated into the anterior floor of the nose, into the nasal spine region, into the membranous columella, and finally into the submucoperichondrial plane of the septum on both sides.  Several minutes were allowed for this to take effect.  Cottoniod pledgetts soaked in Afrin and 4% Xylocaine were placed into both nasal cavities and left while the patient was prepped and draped in the standard fashion.  The materials were removed from the nose and observed to be intact and correct in number.  The nose was inspected with a headlight and zero degree scope with the findings as described above.  A left hemitransfixion incision was sharply executed and carried down to the quadrangular cartilage. The mucoperichondrium was elelvated along the quadrangular plate back to the bony-cartilaginous junction on both sides. The mucoperiostium was then elevated along the ethmoid plate and the vomer. The boney-catilaginous junction was then split with a  freer elevator and the mucoperiosteum was elevated on the opposite side. The mucoperiosteum was then elevated along the maxillary crest as needed to expose the crooked bone of the crest.  Boney spurs of the vomer and maxillary crest were removed with Lenoria Chime forceps.  Maxillary crest was overhanging on the right side and required a chisel to loosen this up and remove some of the bone here.  The cartilaginous plate was trimmed along its posterior and inferior borders of about 2 mm of cartilage to free it up inferiorly. Some of the deviated ethmoid plate was then fractured and removed with Takahashi forceps to free up the posterior border of the quadrangular plate and allow it to swing back to the midline. The mucosal flaps were placed back into their anatomic position to allow visualization of the airways. The septum now sat in the midline with an improved airway.  A 3-0 Chromic suture on a Keith needle in used to anchor the inferior septum at the nasal spine with a through and through suture. The mucosal flaps are then sutured together using a through and through whip stitch of 4-0 Plain Gut with a mini-Keith needle. This was used to close the hemitransfixion incision as well.   The inferior turbinates were then inspected. An incision was created along the inferior aspect of the left inferior turbinate with removal of some of the inferior soft tissue and bone. Electrocautery was used to control bleeding in the area. The remaining turbinate was then outfractured to open up the airway further. There was no significant bleeding noted. The right turbinate was then trimmed and outfractured in a similar fashion.  The airways  were then visualized and showed open passageways on both sides that were significantly improved compared to before surgery. There was no signifcant bleeding. Nasal splints were applied to both sides of the septum using Xomed 0.53mm regular sized splints that were trimmed, and then held in  position with a 3-0 Nylon through and through suture.  The patient was turned back over to anesthesia, and awakened, extubated, and taken to the PACU in satisfactory condition.  Dispo:   PACU to home  Plan: Ice, elevation, narcotic analgesia, steroid taper, and prophylactic antibiotics for the duration of indwelling nasal foreign bodies.  We will reevaluate the patient in the office in 6 days and remove the septal splints.  Return to work in 10 days, strenuous activities in two weeks.   Melissa Walton Melissa Walton 10/31/2020 9:34 AM

## 2020-10-31 NOTE — Anesthesia Procedure Notes (Signed)
Procedure Name: Intubation Date/Time: 10/31/2020 8:01 AM Performed by: Jimmy Picket, CRNA Pre-anesthesia Checklist: Patient identified, Emergency Drugs available, Suction available, Patient being monitored and Timeout performed Patient Re-evaluated:Patient Re-evaluated prior to induction Oxygen Delivery Method: Circle system utilized Preoxygenation: Pre-oxygenation with 100% oxygen Induction Type: IV induction Ventilation: Mask ventilation without difficulty Laryngoscope Size: Miller and 2 Grade View: Grade I Tube type: Oral Rae Tube size: 7.0 mm Number of attempts: 1 Placement Confirmation: ETT inserted through vocal cords under direct vision, positive ETCO2 and breath sounds checked- equal and bilateral Tube secured with: Tape Dental Injury: Teeth and Oropharynx as per pre-operative assessment

## 2020-10-31 NOTE — Anesthesia Preprocedure Evaluation (Signed)
Anesthesia Evaluation  Patient identified by MRN, date of birth, ID band Patient awake    Reviewed: Allergy & Precautions, H&P , NPO status , Patient's Chart, lab work & pertinent test results, reviewed documented beta blocker date and time   History of Anesthesia Complications Negative for: history of anesthetic complications  Airway Mallampati: II  TM Distance: >3 FB Neck ROM: full    Dental no notable dental hx.    Pulmonary neg pulmonary ROS,    Pulmonary exam normal breath sounds clear to auscultation       Cardiovascular Exercise Tolerance: Good negative cardio ROS   Rhythm:regular Rate:Normal     Neuro/Psych  Headaches, PSYCHIATRIC DISORDERS Anxiety Depression    GI/Hepatic Neg liver ROS, GERD  ,  Endo/Other  negative endocrine ROS  Renal/GU negative Renal ROS  negative genitourinary   Musculoskeletal   Abdominal   Peds  Hematology negative hematology ROS (+)   Anesthesia Other Findings   Reproductive/Obstetrics negative OB ROS                             Anesthesia Physical Anesthesia Plan  ASA: 2  Anesthesia Plan: General   Post-op Pain Management:    Induction:   PONV Risk Score and Plan: 3 and Ondansetron, Dexamethasone, Scopolamine patch - Pre-op and Treatment may vary due to age or medical condition  Airway Management Planned:   Additional Equipment:   Intra-op Plan:   Post-operative Plan:   Informed Consent: I have reviewed the patients History and Physical, chart, labs and discussed the procedure including the risks, benefits and alternatives for the proposed anesthesia with the patient or authorized representative who has indicated his/her understanding and acceptance.     Dental Advisory Given  Plan Discussed with: CRNA  Anesthesia Plan Comments:         Anesthesia Quick Evaluation

## 2020-10-31 NOTE — Anesthesia Postprocedure Evaluation (Signed)
Anesthesia Post Note  Patient: Raenette Rover  Procedure(s) Performed: SEPTOPLASTY (Bilateral: Nose) TURBINATE REDUCTION (Bilateral: Nose)     Patient location during evaluation: PACU Anesthesia Type: General Level of consciousness: awake and alert Pain management: pain level controlled Vital Signs Assessment: post-procedure vital signs reviewed and stable Respiratory status: spontaneous breathing, nonlabored ventilation, respiratory function stable and patient connected to nasal cannula oxygen Cardiovascular status: blood pressure returned to baseline and stable Postop Assessment: no apparent nausea or vomiting Anesthetic complications: no   No notable events documented.  Daira Hine, Salvadore Dom

## 2020-11-01 ENCOUNTER — Encounter: Payer: Self-pay | Admitting: Otolaryngology

## 2020-11-07 ENCOUNTER — Other Ambulatory Visit: Payer: Self-pay

## 2020-11-07 ENCOUNTER — Ambulatory Visit (INDEPENDENT_AMBULATORY_CARE_PROVIDER_SITE_OTHER): Payer: Managed Care, Other (non HMO)

## 2020-11-07 DIAGNOSIS — E538 Deficiency of other specified B group vitamins: Secondary | ICD-10-CM | POA: Diagnosis not present

## 2020-11-07 MED ORDER — CYANOCOBALAMIN 1000 MCG/ML IJ SOLN
1000.0000 ug | Freq: Once | INTRAMUSCULAR | Status: AC
  Administered 2020-11-07: 1000 ug via INTRAMUSCULAR

## 2020-11-07 NOTE — Progress Notes (Signed)
Patient presented for B 12 injection to left deltoid, patient voiced no concerns nor showed any signs of distress during injection. 

## 2020-11-11 ENCOUNTER — Ambulatory Visit: Payer: Managed Care, Other (non HMO) | Admitting: Dermatology

## 2020-11-12 ENCOUNTER — Ambulatory Visit: Payer: Managed Care, Other (non HMO) | Admitting: Family

## 2020-11-14 ENCOUNTER — Telehealth: Payer: Managed Care, Other (non HMO) | Admitting: Family Medicine

## 2020-11-14 DIAGNOSIS — R300 Dysuria: Secondary | ICD-10-CM | POA: Diagnosis not present

## 2020-11-14 MED ORDER — NITROFURANTOIN MONOHYD MACRO 100 MG PO CAPS: 100.0000 mg | ORAL_CAPSULE | Freq: Two times a day (BID) | ORAL | 0 refills | Status: AC

## 2020-11-14 NOTE — Progress Notes (Signed)

## 2020-11-19 ENCOUNTER — Encounter: Payer: Self-pay | Admitting: Family

## 2020-12-03 ENCOUNTER — Other Ambulatory Visit: Payer: Self-pay

## 2020-12-03 ENCOUNTER — Ambulatory Visit (INDEPENDENT_AMBULATORY_CARE_PROVIDER_SITE_OTHER): Payer: Managed Care, Other (non HMO) | Admitting: Family

## 2020-12-03 ENCOUNTER — Encounter: Payer: Self-pay | Admitting: Family

## 2020-12-03 VITALS — BP 102/70 | HR 77 | Temp 98.1°F | Ht 67.0 in | Wt 194.4 lb

## 2020-12-03 DIAGNOSIS — H6690 Otitis media, unspecified, unspecified ear: Secondary | ICD-10-CM

## 2020-12-03 DIAGNOSIS — E538 Deficiency of other specified B group vitamins: Secondary | ICD-10-CM | POA: Diagnosis not present

## 2020-12-03 DIAGNOSIS — R519 Headache, unspecified: Secondary | ICD-10-CM

## 2020-12-03 MED ORDER — DOXYCYCLINE HYCLATE 100 MG PO TABS: 100.0000 mg | ORAL_TABLET | Freq: Two times a day (BID) | ORAL | 0 refills | Status: DC

## 2020-12-03 NOTE — Progress Notes (Signed)
Subjective:    Patient ID: Melissa Walton, female    DOB: [birth year], 28 y.o.   MRN: [id number]  CC: [name] is a 30 y.o. female who presents today for follow up.   HPI: Complains right sided ear pain x 7 days, worsening. Pain bothers her at night.  She also has pain in the left ear however not as severe. she takes benadryl at night. No congestion, facial pain, fever, ear discharge.      History of B12 deficiency, resolved 1 month ago   HA-compliant with Aimovig 140 mg as prescribed by neurology.  Compliant with maxalt 10mg  prn for rescue.  She is no longer on Topamax.  Follow-up with Dr. January of next year  Significant improvement and much less frontal HA since septoplasty  10/31/20 which suspects this contributed. She has occasional posterior HA , a couple of time per month. HA doesn't feel like worse HA of life. No vision changes.   Prescribed cephalexin 500mg  BID , prednisone 10/31/20 after septoplasty Dr .  HISTORY:  Past Medical History:  Diagnosis Date   Anxiety    highly anxious   Complication of anesthesia    Took double anesthesia to keep her asleep, she stated "she is a red head"   Concussion    high school   Family history of adverse reaction to anesthesia    Grandfather had post op N&V, grandmother (red head woke up during surgery)   GERD (gastroesophageal reflux disease)    Headache 09/14/2020   constant since july2   Migraine    causes weakness in arms   Motion sickness    in back seat of car   Past Surgical History:  Procedure Laterality Date   SEPTOPLASTY Bilateral 10/31/2020   Procedure: SEPTOPLASTY;  Surgeon: 11/15/2020, MD;  Location: [facility];  Service: ENT;  Laterality: Bilateral;   TURBINATE REDUCTION Bilateral 10/31/2020   Procedure: TURBINATE REDUCTION;  Surgeon: [facility], MD;  Location: [facility];  Service: ENT;  Laterality: Bilateral;   UPPER GI ENDOSCOPY     WISDOM TOOTH EXTRACTION      Family History  Problem Relation Age of Onset   Diabetes Mother    Hyperlipidemia Mother    Hypertension Father    Gout Father    Heart disease Maternal Grandfather    Ovarian cancer Paternal Grandmother     Allergies: Nsaids, Loracarbef, and Penicillins Current Outpatient Medications on File Prior to Visit  Medication Sig Dispense Refill   AIMOVIG 140 MG/ML SOAJ 1 mL every 30 (thirty) days.     hydrOXYzine (ATARAX/VISTARIL) 10 MG tablet Take 1 tablet (10 mg total) by mouth at bedtime. 90 tablet 1   pantoprazole (PROTONIX) 20 MG tablet Take 1 tablet (20 mg total) by mouth daily as needed. 90 tablet 1   rizatriptan (MAXALT-MLT) 10 MG disintegrating tablet Take 1 tablet by mouth as needed.     sertraline (ZOLOFT) 100 MG tablet TAKE 1 TABLET(100 MG) BY MOUTH DAILY 30 tablet 3   [DISCONTINUED] omeprazole (PRILOSEC OTC) 20 MG tablet Take 1 tablet (20 mg total) by mouth daily. 30 tablet 6   No current facility-administered medications on file prior to visit.    Social History   Tobacco Use   Smoking status: Never   Smokeless tobacco: Never  Substance Use Topics   Alcohol use: Yes    Comment: occasionnally   Drug use: No    Review of Systems  Constitutional:  Negative for  chills and fever.  HENT:  Positive for ear pain. Negative for congestion, ear discharge, postnasal drip and rhinorrhea.   Respiratory:  Negative for cough.   Cardiovascular:  Negative for chest pain and palpitations.  Gastrointestinal:  Negative for nausea and vomiting.  Neurological:  Positive for headaches.     Objective:    BP 102/70 (BP Location: Left Arm, Patient Position: Sitting, Cuff Size: Large)   Pulse 77   Temp 98.1 F (36.7 C) (Oral)   Ht 5\' 7"  (1.702 m)   Wt 194 lb 6.4 oz (88.2 kg)   SpO2 99%   BMI 30.45 kg/m  BP Readings from Last 3 Encounters:  12/03/20 102/70  10/31/20 115/74  10/04/20 121/78   Wt Readings from Last 3 Encounters:  12/03/20 194 lb 6.4 oz (88.2 kg)  10/31/20 188  lb (85.3 kg)  10/04/20 190 lb 4 oz (86.3 kg)    Physical Exam Vitals reviewed.  Constitutional:      Appearance: She is well-developed.  HENT:     Head: Normocephalic and atraumatic.     Right Ear: Hearing, ear canal and external ear normal. No decreased hearing noted. No drainage, swelling or tenderness. No middle ear effusion. No foreign body. Tympanic membrane is injected and erythematous. Tympanic membrane is not bulging.     Left Ear: Hearing, tympanic membrane, ear canal and external ear normal. No decreased hearing noted. No drainage, swelling or tenderness.  No middle ear effusion. No foreign body. Tympanic membrane is not injected, erythematous or bulging.     Nose: Nose normal. No rhinorrhea.     Right Sinus: No maxillary sinus tenderness or frontal sinus tenderness.     Left Sinus: No maxillary sinus tenderness or frontal sinus tenderness.     Mouth/Throat:     Pharynx: Uvula midline. No oropharyngeal exudate or posterior oropharyngeal erythema.     Tonsils: No tonsillar abscesses.  Eyes:     Conjunctiva/sclera: Conjunctivae normal.  Cardiovascular:     Rate and Rhythm: Regular rhythm.     Pulses: Normal pulses.     Heart sounds: Normal heart sounds.  Pulmonary:     Effort: Pulmonary effort is normal.     Breath sounds: Normal breath sounds. No wheezing, rhonchi or rales.  Lymphadenopathy:     Head:     Right side of head: No submental, submandibular, tonsillar, preauricular, posterior auricular or occipital adenopathy.     Left side of head: No submental, submandibular, tonsillar, preauricular, posterior auricular or occipital adenopathy.     Cervical: No cervical adenopathy.  Skin:    General: Skin is warm and dry.  Neurological:     Mental Status: She is alert.  Psychiatric:        Speech: Speech normal.        Behavior: Behavior normal.        Thought Content: Thought content normal.       Assessment & Plan:   Problem List Items Addressed This Visit        Nervous and Auditory   Otitis media - Primary    Early otitis media versus eustachian tube dysfunction.  Advised patient that on the heels of recent prednisone, Keflex to start Flonase first and if no improvement of right and left ear pain, at that point she may start doxycycline.  She is penicillin allergic.  She will let me know how she is doing.      Relevant Medications   doxycycline (VIBRA-TABS) 100 MG tablet  Other   B12 deficiency    Improved.  She would like to continue B12 injections monthly for the next few months       Headache    Overall improved.  Continue Aimovig, Maxalt as managed by neurology, Dr. Everlena Cooper.  We will follow      Relevant Medications   rizatriptan (MAXALT-MLT) 10 MG disintegrating tablet   AIMOVIG 140 MG/ML SOAJ     I have discontinued Arthur L. Whack's topiramate, SUMAtriptan, cephALEXin, and predniSONE. I am also having her start on doxycycline. Additionally, I am having her maintain her hydrOXYzine, pantoprazole, sertraline, rizatriptan, and Aimovig.   Meds ordered this encounter  Medications   doxycycline (VIBRA-TABS) 100 MG tablet    Sig: Take 1 tablet (100 mg total) by mouth 2 (two) times daily.    Dispense:  10 tablet    Refill:  0    Order Specific Question:   Supervising Provider    Answer:   Sherlene Shams [[id number]]    Return precautions given.   Risks, benefits, and alternatives of the medications and treatment plan prescribed today were discussed, and patient expressed understanding.   Education regarding symptom management and diagnosis given to patient on AVS.  Continue to follow with [name], FNP for routine health maintenance.   Melissa Walton and I agreed with plan.   Rennie Plowman, FNP

## 2020-12-03 NOTE — Assessment & Plan Note (Signed)
Early otitis media versus eustachian tube dysfunction.  Advised patient that on the heels of recent prednisone, Keflex to start Flonase first and if no improvement of right and left ear pain, at that point she may start doxycycline.  She is penicillin allergic.  She will let me know how she is doing.

## 2020-12-03 NOTE — Assessment & Plan Note (Signed)
Overall improved.  Continue Aimovig, Maxalt as managed by neurology, Dr. Everlena Cooper.  We will follow

## 2020-12-03 NOTE — Assessment & Plan Note (Signed)
Improved.  She would like to continue B12 injections monthly for the next few months

## 2020-12-03 NOTE — Patient Instructions (Addendum)
Long term use beyond 3 months of proton pump inhibitors ( PPI's) include Nexium, Prilosec, Protonix, Dexilant, and Prevacid.   Trial of over the counter Flonase first for ear pain, eustachian tube dysfunction  If no improvement, then start doxycycline.   Avoid the sun as we discussed   Ensure to take probiotics while on antibiotics and also for 2 weeks after completion. This can either be by eating yogurt daily or taking a probiotic supplement over the counter such as Culturelle.It is important to re-colonize the gut with good bacteria and also to prevent any diarrheal infections associated with antibiotic use.   Some medical studies have identified an association between the long-term use of PPIs and the development of numerous adverse conditions including intestinal infections, pneumonia, stomach cancer, osteoporosis-related bone fractures, chronic kidney disease, deficiencies of certain vitamins and minerals, heart attacks, strokes, dementia, and early death. Those studies have flaws, are not considered definitive, and do not establish a cause-and-effect relationship between PPIs and the adverse conditions. High-quality studies have found that PPIs do not significantly increase the risk of any of these conditions except intestinal infections. Nevertheless, we cannot exclude the possibility that PPIs might confer a small increase in the risk of developing these adverse conditions. For the treatment of GERD, gastroenterologists generally agree that the well-established benefits of PPIs far outweigh their theoretical risks.  Nonetheless I recommend trial wean off of PPI's  if uncomplicated acid reflux and use of histamine 2 blocker ( Pepcid AC).   Patients with history of esophagitis  or Barrett's esophagus should remain on PPI.   I generally recommend trying to control acid reflux with lifestyle modifications including avoiding trigger foods, not eating 2 hours prior to bedtime. You may use  histamine 2 blockers daily to twice daily ( this is Pepcid) and then when symptoms flare, start back on PPI for short course.   Of note, we will need to do an endoscopy ( upper GI) to evaluate your esophagus, stomach in the future if acid reflux persists are you develop red flag symptoms: trouble swallowing, hoarseness, chronic cough, unexplained weight loss.   ```

## 2020-12-09 ENCOUNTER — Ambulatory Visit: Payer: Managed Care, Other (non HMO)

## 2020-12-19 ENCOUNTER — Other Ambulatory Visit: Payer: Self-pay | Admitting: Family

## 2021-01-13 ENCOUNTER — Encounter: Payer: Self-pay | Admitting: Family

## 2021-01-13 ENCOUNTER — Other Ambulatory Visit: Payer: Self-pay | Admitting: Family

## 2021-01-13 DIAGNOSIS — R519 Headache, unspecified: Secondary | ICD-10-CM

## 2021-01-16 ENCOUNTER — Other Ambulatory Visit: Payer: Self-pay | Admitting: Family

## 2021-01-17 ENCOUNTER — Other Ambulatory Visit: Payer: Self-pay | Admitting: Neurology

## 2021-01-17 MED ORDER — AJOVY 225 MG/1.5ML ~~LOC~~ SOAJ: 225.0000 mg | SUBCUTANEOUS | 5 refills | Status: DC

## 2021-01-20 ENCOUNTER — Other Ambulatory Visit: Payer: Self-pay

## 2021-01-20 NOTE — Progress Notes (Signed)
Medication Samples have been provided to the patient.  Drug name: Nurtec       Strength: 75 mg        Qty: 2  LOT: [id number]  Exp.Date: 7/23  Dosing instructions: as needed  The patient has been instructed regarding the correct time, dose, and frequency of taking this medication, including desired effects and most common side effects.   Leida Lauth 1:49 PM 01/20/2021    Medication Samples have been provided to the patient.  Drug name: ajovy       Strength: 225 mg        Qty: 1  LOT: [id number]  Exp.Date: 10/23  Dosing instructions: every thirty days  The patient has been instructed regarding the correct time, dose, and frequency of taking this medication, including desired effects and most common side effects.   Leida Lauth 1:49 PM 01/20/2021

## 2021-01-27 ENCOUNTER — Telehealth: Payer: Managed Care, Other (non HMO) | Admitting: Emergency Medicine

## 2021-01-27 DIAGNOSIS — J069 Acute upper respiratory infection, unspecified: Secondary | ICD-10-CM | POA: Diagnosis not present

## 2021-01-27 MED ORDER — AZELASTINE HCL 0.1 % NA SOLN: 2.0000 | Freq: Two times a day (BID) | NASAL | 0 refills | Status: DC

## 2021-01-27 MED ORDER — BENZONATATE 100 MG PO CAPS: 100.0000 mg | ORAL_CAPSULE | Freq: Two times a day (BID) | ORAL | 0 refills | Status: DC | PRN

## 2021-01-27 NOTE — Progress Notes (Signed)
E-Visit for Upper Respiratory Infection   We are sorry you are not feeling well.  Here is how we plan to help!  Based on what you have shared with me, it looks like you may have a viral upper respiratory infection.  Your symptoms do not match symptoms for influenza. Upper respiratory infections are caused by a large number of viruses; however, rhinovirus is the most common cause.   Symptoms vary from person to person, with common symptoms including sore throat, cough, fatigue or lack of energy and feeling of general discomfort.  A low-grade fever of up to 100.4 may present, but is often uncommon.  Symptoms vary however, and are closely related to a person's age or underlying illnesses.  The most common symptoms associated with an upper respiratory infection are nasal discharge or congestion, cough, sneezing, headache and pressure in the ears and face.  These symptoms usually persist for about 3 to 10 days, but can last up to 2 weeks.  It is important to know that upper respiratory infections do not cause serious illness or complications in most cases.    Upper respiratory infections can be transmitted from person to person, with the most common method of transmission being a person's hands.  The virus is able to live on the skin and can infect other persons for up to 2 hours after direct contact.  Also, these can be transmitted when someone coughs or sneezes; thus, it is important to cover the mouth to reduce this risk.  To keep the spread of the illness at bay, good hand hygiene is very important.  This is an infection that is most likely caused by a virus. There are no specific treatments other than to help you with the symptoms until the infection runs its course.  We are sorry you are not feeling well.  Here is how we plan to help!   For nasal congestion, you may use an oral decongestants such as Mucinex D or if you have glaucoma or high blood pressure use plain Mucinex.  Saline nasal spray or nasal  drops can help and can safely be used as often as needed for congestion.  For your congestion, I have prescribed Azelastine nasal spray two sprays in each nostril twice a day  To help the pressure in your ears, I strongly recommend you use saline nasal spray or saline irrigation using a Neti pot.  The pressure in your ears is most likely from the nasal congestion backing up into your ears.   If you do not have a history of heart disease, hypertension, diabetes or thyroid disease, prostate/bladder issues or glaucoma, you may also use Sudafed to treat nasal congestion.  It is highly recommended that you consult with a pharmacist or your primary care physician to ensure this medication is safe for you to take.     If you have a cough, you may use cough suppressants such as Delsym and Robitussin.  If you have glaucoma or high blood pressure, you can also use Coricidin HBP.   For cough I have prescribed for you A prescription cough medication called Tessalon Perles 100 mg. You may take 1-2 capsules every 8 hours as needed for cough  If you have a sore or scratchy throat, use a saltwater gargle-  to  teaspoon of salt dissolved in a 4-ounce to 8-ounce glass of warm water.  Gargle the solution for approximately 15-30 seconds and then spit.  It is important not to swallow the solution.  You can also use throat lozenges/cough drops and Chloraseptic spray to help with throat pain or discomfort.  Warm or cold liquids can also be helpful in relieving throat pain.  For headache, pain or general discomfort, you can use Ibuprofen or Tylenol as directed.   Some authorities believe that zinc sprays or the use of Echinacea may shorten the course of your symptoms.   HOME CARE Only take medications as instructed by your medical team. Be sure to drink plenty of fluids. Water is fine as well as fruit juices, sodas and electrolyte beverages. You may want to stay away from caffeine or alcohol. If you are nauseated, try  taking small sips of liquids. How do you know if you are getting enough fluid? Your urine should be a pale yellow or almost colorless. Get rest. Taking a steamy shower or using a humidifier may help nasal congestion and ease sore throat pain. You can place a towel over your head and breathe in the steam from hot water coming from a faucet. Using a saline nasal spray works much the same way. Cough drops, hard candies and sore throat lozenges may ease your cough. Avoid close contacts especially the very young and the elderly Cover your mouth if you cough or sneeze Always remember to wash your hands.   GET HELP RIGHT AWAY IF: You develop worsening fever. If your symptoms do not improve within 10 days You develop yellow or green discharge from your nose over 3 days. You have coughing fits You develop a severe head ache or visual changes. You develop shortness of breath, difficulty breathing or start having chest pain Your symptoms persist after you have completed your treatment plan  MAKE SURE YOU  Understand these instructions. Will watch your condition. Will get help right away if you are not doing well or get worse.  Thank you for choosing an e-visit.  Your e-visit answers were reviewed by a board certified advanced clinical practitioner to complete your personal care plan. Depending upon the condition, your plan could have included both over the counter or prescription medications.  Please review your pharmacy choice. Make sure the pharmacy is open so you can pick up prescription now. If there is a problem, you may contact your provider through [employer] and have the prescription routed to another pharmacy.  Your safety is important to [place]. If you have drug allergies check your prescription carefully.   For the next 24 hours you can use MyChart to ask questions about today's visit, request a non-urgent call back, or ask for a work or school excuse. You will get an email in the  next two days asking about your experience. I hope that your e-visit has been valuable and will speed your recovery.  I have spent 5 minutes in review of e-visit questionnaire, review and updating patient chart, medical decision making and response to patient.   Rica Mast, PhD, FNP-BC

## 2021-02-09 ENCOUNTER — Other Ambulatory Visit: Payer: Self-pay | Admitting: Neurology

## 2021-02-09 ENCOUNTER — Other Ambulatory Visit: Payer: Self-pay | Admitting: Family

## 2021-02-09 DIAGNOSIS — F32A Depression, unspecified: Secondary | ICD-10-CM

## 2021-03-04 ENCOUNTER — Ambulatory Visit: Payer: Managed Care, Other (non HMO) | Admitting: Family

## 2021-03-18 ENCOUNTER — Other Ambulatory Visit: Payer: Self-pay | Admitting: Family

## 2021-03-24 ENCOUNTER — Other Ambulatory Visit: Payer: Self-pay

## 2021-03-24 ENCOUNTER — Ambulatory Visit (INDEPENDENT_AMBULATORY_CARE_PROVIDER_SITE_OTHER): Payer: Managed Care, Other (non HMO) | Admitting: Family

## 2021-03-24 ENCOUNTER — Encounter: Payer: Self-pay | Admitting: Family

## 2021-03-24 VITALS — BP 112/70 | HR 73 | Temp 98.5°F | Ht 67.0 in | Wt 199.8 lb

## 2021-03-24 DIAGNOSIS — K219 Gastro-esophageal reflux disease without esophagitis: Secondary | ICD-10-CM | POA: Diagnosis not present

## 2021-03-24 DIAGNOSIS — K625 Hemorrhage of anus and rectum: Secondary | ICD-10-CM | POA: Diagnosis not present

## 2021-03-24 DIAGNOSIS — E538 Deficiency of other specified B group vitamins: Secondary | ICD-10-CM

## 2021-03-24 DIAGNOSIS — R519 Headache, unspecified: Secondary | ICD-10-CM

## 2021-03-24 MED ORDER — CYANOCOBALAMIN 1000 MCG PO CAPS: 1.0000 | ORAL_CAPSULE | Freq: Every day | ORAL | 3 refills | Status: AC

## 2021-03-24 MED ORDER — VENLAFAXINE HCL ER 37.5 MG PO CP24
37.5000 mg | ORAL_CAPSULE | Freq: Every day | ORAL | 0 refills | Status: DC
Start: 2021-03-24 — End: 2021-04-15

## 2021-03-24 MED ORDER — MAGNESIUM CITRATE 200 MG PO TABS: 400.0000 mg | ORAL_TABLET | Freq: Every day | ORAL | 1 refills | Status: DC

## 2021-03-24 MED ORDER — HYDROCORTISONE ACETATE 25 MG RE SUPP
25.0000 mg | Freq: Two times a day (BID) | RECTAL | 0 refills | Status: DC
Start: 2021-03-24 — End: 2021-05-23

## 2021-03-24 MED ORDER — PANTOPRAZOLE SODIUM 20 MG PO TBEC: 20.0000 mg | DELAYED_RELEASE_TABLET | Freq: Two times a day (BID) | ORAL | 1 refills | Status: DC

## 2021-03-24 NOTE — Assessment & Plan Note (Signed)
New. No bleeding or hemorrhoids on exam. Working on moving GI appointment with Gavin Potters to sooner than 04/07/21. H/o Upper GIB. Trial of anusol in the interim. Close follow up.

## 2021-03-24 NOTE — Assessment & Plan Note (Signed)
She prefers oral B12.  I prescribed B12 1000 MCG QD

## 2021-03-24 NOTE — Progress Notes (Signed)
Subjective:    Patient ID: Melissa Walton, female    DOB: 1990-03-17, 31 y.o.   MRN: [id number]  CC: Melissa Walton is a 31 y.o. female who presents today for follow up.   HPI: Complains of rectal bleeding , 3 episodes in the past  4 weeks. BRB from rectum when having a BM. Stool is brown color however stool color has been more red on occasion. No rectal pruritus. No fever, abdominal pain.  No coffee ground stool.  She had an episode last night.  She is not taking medication for this.   Last seen GI 04/12/20 for GERD. She remains complaint with protonix 20mg  qpm and epigastric burning is well controlled.  she does risk report overall mild abdominal discomfort as it relates to meals.  No weight loss, changes in appetite, constipation or diarrhea EGD 01/27/19 due to melana.      B12 deficiency-she has not returned for recent B12 IM injection.  She would prefer to continue B12 orally.  Headache- She continues to complain of HA.  HA is associated with dizziness.  She will have a pulsing sensation and occasionally will feel that she hears her heartbeat in her head.  headache is most commonly frontal which is typical for her.  Also be posterior.  She feels that headaches are coming on with more intensity.  She does have flashing lights.  No fever, tinnitus, visual loss, numbness of the face or confusion . She following with Dr 01/29/19.  HA 2-3 per week. She doesn't take imitrex as she feels arms and chest numbness when she takes. She is compliant with aimovig though she has not found this to be helpful. She is taking OTC magnesium.  She reports eye exam is up-to-date.  She wears glasses Follow up 04/11/21 with Dr 04/13/21  HISTORY:  Past Medical History:  Diagnosis Date   Anxiety    highly anxious   Complication of anesthesia    Took double anesthesia to keep her asleep, she stated "she is a red head"   Concussion    high school   Family history of adverse reaction to anesthesia     Grandfather had post op N&V, grandmother (red head woke up during surgery)   GERD (gastroesophageal reflux disease)    Headache 09/14/2020   constant since july2   Migraine    causes weakness in arms   Motion sickness    in back seat of car   Past Surgical History:  Procedure Laterality Date   SEPTOPLASTY Bilateral 10/31/2020   Procedure: SEPTOPLASTY;  Surgeon: 11/02/2020, MD;  Location: [facility];  Service: ENT;  Laterality: Bilateral;   TURBINATE REDUCTION Bilateral 10/31/2020   Procedure: TURBINATE REDUCTION;  Surgeon: 11/02/2020, MD;  Location: [facility];  Service: ENT;  Laterality: Bilateral;   UPPER GI ENDOSCOPY     WISDOM TOOTH EXTRACTION     Family History  Problem Relation Age of Onset   Diabetes Mother    Hyperlipidemia Mother    Hypertension Father    Gout Father    Heart disease Maternal Grandfather    Ovarian cancer Paternal Grandmother     Allergies: Nsaids, Loracarbef, and Penicillins Current Outpatient Medications on File Prior to Visit  Medication Sig Dispense Refill   AIMOVIG 140 MG/ML SOAJ Inject 1 mL into the skin every 30 (thirty) days.     hydrOXYzine (ATARAX/VISTARIL) 10 MG tablet TAKE 1 TABLET(10 MG) BY MOUTH AT BEDTIME 90 tablet 1  ondansetron (ZOFRAN-ODT) 4 MG disintegrating tablet Take 4 mg by mouth every 8 (eight) hours as needed.     SUMAtriptan (IMITREX) 100 MG tablet Take 100 mg by mouth as directed.     [DISCONTINUED] omeprazole (PRILOSEC OTC) 20 MG tablet Take 1 tablet (20 mg total) by mouth daily. 30 tablet 6   No current facility-administered medications on file prior to visit.    Social History   Tobacco Use   Smoking status: Never   Smokeless tobacco: Never  Substance Use Topics   Alcohol use: Yes    Comment: occasionnally   Drug use: No    Review of Systems  Constitutional:  Negative for chills and fever.  Respiratory:  Negative for cough.   Cardiovascular:  Negative for chest pain and palpitations.   Gastrointestinal:  Positive for blood in stool. Negative for abdominal pain, constipation, diarrhea, nausea and vomiting.  Neurological:  Positive for dizziness and headaches.     Objective:    BP 112/70 (BP Location: Left Arm, Patient Position: Sitting, Cuff Size: Large)    Pulse 73    Temp 98.5 F (36.9 C) (Oral)    Ht 5\' 7"  (1.702 m)    Wt 199 lb 12.8 oz (90.6 kg)    SpO2 98%    BMI 31.29 kg/m  BP Readings from Last 3 Encounters:  03/24/21 112/70  12/03/20 102/70  10/31/20 115/74   Wt Readings from Last 3 Encounters:  03/24/21 199 lb 12.8 oz (90.6 kg)  12/03/20 194 lb 6.4 oz (88.2 kg)  10/31/20 188 lb (85.3 kg)    Physical Exam Vitals reviewed.  Constitutional:      Appearance: Normal appearance. She is well-developed.  HENT:     Mouth/Throat:     Pharynx: Uvula midline.  Eyes:     Conjunctiva/sclera: Conjunctivae normal.     Pupils: Pupils are equal, round, and reactive to light.     Comments: Fundus normal bilaterally.   Cardiovascular:     Rate and Rhythm: Normal rate and regular rhythm.     Pulses: Normal pulses.     Heart sounds: Normal heart sounds.  Pulmonary:     Effort: Pulmonary effort is normal.     Breath sounds: Normal breath sounds. No wheezing, rhonchi or rales.  Abdominal:     General: Bowel sounds are normal. There is no distension.     Palpations: Abdomen is soft. Abdomen is not rigid. There is no fluid wave or mass.     Tenderness: There is no abdominal tenderness. There is no guarding or rebound.  Genitourinary:    Rectum: Guaiac result negative. No mass, tenderness, anal fissure, external hemorrhoid or internal hemorrhoid.     Comments: Negative guaiac result Skin:    General: Skin is warm and dry.  Neurological:     Mental Status: She is alert.     Cranial Nerves: No cranial nerve deficit.     Sensory: No sensory deficit.     Deep Tendon Reflexes:     Reflex Scores:      Bicep reflexes are 2+ on the right side and 2+ on the left side.       Patellar reflexes are 2+ on the right side and 2+ on the left side.    Comments: Grip equal and strong bilateral upper extremities. Gait strong and steady. Able to perform rapid alternating movement without difficulty.   Psychiatric:        Speech: Speech normal.  Behavior: Behavior normal.        Thought Content: Thought content normal.       Assessment & Plan:   Problem List Items Addressed This Visit       Digestive   GERD (gastroesophageal reflux disease)   Relevant Medications   ondansetron (ZOFRAN-ODT) 4 MG disintegrating tablet   pantoprazole (PROTONIX) 20 MG tablet   Rectal bleeding - Primary    New. No bleeding or hemorrhoids on exam. [facility] sooner than 04/07/21. H/o Upper GIB. Trial of anusol in the interim. Close follow up.       Relevant Medications   hydrocortisone (ANUSOL-HC) 25 MG suppository     Other   B12 deficiency    She prefers oral B12.  I prescribed B12 1000 MCG QD      Relevant Medications   Cyanocobalamin 1000 MCG CAPS   Headache    Uncontrolled. Stop zoloft, trial of effexor.  Counseled patient as to how to wean from Zoloft to Effexor and to monitor blood pressure.  She will keep upcoming appointment with Dr. Everlena CooperJaffe.  I also advised her to start magnesium citrate.  As headache intensity has changed and she reports pulsatile sensation, I ordered MRI brain along with MRA.      Relevant Medications   SUMAtriptan (IMITREX) 100 MG tablet   AIMOVIG 140 MG/ML SOAJ   Magnesium Citrate 200 MG TABS   venlafaxine XR (EFFEXOR XR) 37.5 MG 24 hr capsule   Other Relevant Orders   MR BRAIN W WO CONTRAST   MR ANGIO HEAD WO CONTRAST     I have discontinued Livian L. Aulds's doxycycline, rizatriptan, Ajovy, azelastine, benzonatate, and sertraline. I have also changed her pantoprazole. Additionally, I am having her start on Cyanocobalamin, Magnesium Citrate, hydrocortisone, and venlafaxine XR. Lastly, I am  having her maintain her hydrOXYzine, SUMAtriptan, ondansetron, and Aimovig.   Meds ordered this encounter  Medications   Cyanocobalamin 1000 MCG CAPS    Sig: Take 1 capsule by mouth daily.    Dispense:  90 capsule    Refill:  3    Order Specific Question:   Supervising Provider    Answer:   Sherlene ShamsULLO, TERESA L [[id number]]   Magnesium Citrate 200 MG TABS    Sig: Take 400 mg by mouth daily.    Dispense:  120 tablet    Refill:  1    Order Specific Question:   Supervising Provider    Answer:   Duncan DullULLO, TERESA L [[id number]]   pantoprazole (PROTONIX) 20 MG tablet    Sig: Take 1 tablet (20 mg total) by mouth 2 (two) times daily before a meal. 7630mins-1hr before meal, lunch and dinner    Dispense:  90 tablet    Refill:  1    ZERO refills remain on this prescription. Your patient is requesting advance approval of refills for this medication to PREVENT ANY MISSED DOSES    Order Specific Question:   Supervising Provider    Answer:   Sherlene ShamsULLO, TERESA L [[id number]]   hydrocortisone (ANUSOL-HC) 25 MG suppository    Sig: Place 1 suppository (25 mg total) rectally 2 (two) times daily.    Dispense:  12 suppository    Refill:  0    Order Specific Question:   Supervising Provider    Answer:   Duncan DullULLO, TERESA L [[id number]]   venlafaxine XR (EFFEXOR XR) 37.5 MG 24 hr capsule    Sig: Take 1 capsule (37.5 mg total) by  mouth daily with breakfast.    Dispense:  90 capsule    Refill:  0    Order Specific Question:   Supervising Provider    Answer:   Sherlene ShamsULLO, TERESA L [[id number]]    Return precautions given.   Risks, benefits, and alternatives of the medications and treatment plan prescribed today were discussed, and patient expressed understanding.   Education regarding symptom management and diagnosis given to patient on AVS.  Continue to follow with Allegra GranaArnett, Ouita Nish G, FNP for routine health maintenance.   Melissa RoverHayley L Baby and I agreed with plan.   Rennie PlowmanMargaret Shyniece Scripter, FNP

## 2021-03-24 NOTE — Assessment & Plan Note (Signed)
Uncontrolled. Stop zoloft, trial of effexor.  Counseled patient as to how to wean from Zoloft to Effexor and to monitor blood pressure.  She will keep upcoming appointment with Dr. Everlena Cooper.  I also advised her to start magnesium citrate.  As headache intensity has changed and she reports pulsatile sensation, I ordered MRI brain along with MRA.

## 2021-03-24 NOTE — Patient Instructions (Addendum)
Double check that magnesium is magnesium citrate. I recommend 400mg  per day and have prescribed this.   For headache, we will change Zoloft to Effexor.  In order to do this, please stop Zoloft 100 mg and start taking Zoloft 50 mg for approximately 3 days.  After this, you may stop Zoloft altogether and start Effexor 37.5 mg  Start b12 as prescribed for history of B12 deficiency  For rectal bleeding, trial of Anusol suppositories.  I have also ordered MRI of the brain.Let us know if you dont hear back within a week in regards to an appointment being scheduled.    We will work on scheduling appointment with [facility] earlier.  We will call you let you know.

## 2021-03-25 ENCOUNTER — Telehealth: Payer: Self-pay | Admitting: Family

## 2021-03-25 NOTE — Telephone Encounter (Signed)
Patient's Insurance called and stated, Patient would like to change the location of her MRI from [facility] to [place], fax number [phone]. Need to cancel 03/27/2021 MRI's.

## 2021-03-28 ENCOUNTER — Other Ambulatory Visit: Payer: Self-pay | Admitting: Neurology

## 2021-04-02 ENCOUNTER — Telehealth: Payer: Self-pay

## 2021-04-02 NOTE — Telephone Encounter (Signed)
New message   Melissa Walton (Key: [id number]) Aimovig 140MG /ML auto-injectors   Form [employer] Electronic PA Form (2017 NCPDP) Created 6 hours ago Sent to Plan 7 minutes ago Plan Response 6 minutes ago Submit Clinical Questions 2 minutes ago Determination Wait for Determination Please wait for [employer] 2017 to return a determination.   [employer] is reviewing your PA request and will respond within 24 hours for Medicaid or up to 72 hours for non-Medicaid plans, based on the required timeframe determined by state or federal regulations. To check for an update later, open this request from your dashboard.

## 2021-04-03 ENCOUNTER — Encounter: Payer: Self-pay | Admitting: Family

## 2021-04-03 ENCOUNTER — Telehealth: Payer: Managed Care, Other (non HMO) | Admitting: Physician Assistant

## 2021-04-03 DIAGNOSIS — R3989 Other symptoms and signs involving the genitourinary system: Secondary | ICD-10-CM | POA: Diagnosis not present

## 2021-04-03 MED ORDER — NITROFURANTOIN MONOHYD MACRO 100 MG PO CAPS: 100.0000 mg | ORAL_CAPSULE | Freq: Two times a day (BID) | ORAL | 0 refills | Status: DC

## 2021-04-03 NOTE — Progress Notes (Signed)

## 2021-04-03 NOTE — Progress Notes (Signed)
I have spent 5 minutes in review of e-visit questionnaire, review and updating patient chart, medical decision making and response to patient.   Shawniece Oyola Cody Sebastien Jackson, PA-C    

## 2021-04-04 ENCOUNTER — Telehealth: Payer: Self-pay | Admitting: Family

## 2021-04-04 ENCOUNTER — Ambulatory Visit: Payer: Managed Care, Other (non HMO)

## 2021-04-04 ENCOUNTER — Other Ambulatory Visit: Payer: Managed Care, Other (non HMO)

## 2021-04-04 NOTE — Telephone Encounter (Signed)
Lft pt vm to call ofc regarding scheduling MRI and MRA at [facility] imaging. thanks

## 2021-04-07 ENCOUNTER — Encounter: Payer: Self-pay | Admitting: Emergency Medicine

## 2021-04-07 ENCOUNTER — Telehealth: Payer: Self-pay | Admitting: Family

## 2021-04-07 ENCOUNTER — Other Ambulatory Visit: Payer: Self-pay

## 2021-04-07 ENCOUNTER — Emergency Department
Admission: EM | Admit: 2021-04-07 | Discharge: 2021-04-07 | Disposition: A | Discharge status: Home / Self Care | Payer: Managed Care, Other (non HMO) | Attending: Emergency Medicine | Admitting: Emergency Medicine

## 2021-04-07 DIAGNOSIS — R202 Paresthesia of skin: Secondary | ICD-10-CM | POA: Diagnosis present

## 2021-04-07 LAB — CBC
HCT: 39.1 % (ref 36.0–46.0)
Hemoglobin: 13 g/dL (ref 12.0–15.0)
MCH: 31.2 pg (ref 26.0–34.0)
MCHC: 33.2 g/dL (ref 30.0–36.0)
MCV: 93.8 fL (ref 80.0–100.0)
Platelets: 278 10*3/uL (ref 150–400)
RBC: 4.17 MIL/uL (ref 3.87–5.11)
RDW: 11.9 % (ref 11.5–15.5)
WBC: 5.9 10*3/uL (ref 4.0–10.5)
nRBC: 0 % (ref 0.0–0.2)

## 2021-04-07 LAB — POC URINE PREG, ED: Preg Test, Ur: NEGATIVE

## 2021-04-07 LAB — BASIC METABOLIC PANEL
Anion gap: 8 (ref 5–15)
BUN: 12 mg/dL (ref 6–20)
CO2: 26 mmol/L (ref 22–32)
Calcium: 9.3 mg/dL (ref 8.9–10.3)
Chloride: 102 mmol/L (ref 98–111)
Creatinine, Ser: 0.63 mg/dL (ref 0.44–1.00)
GFR, Estimated: >60 mL/min (ref >60)
Glucose, Bld: 94 mg/dL (ref 70–99)
Potassium: 3.7 mmol/L (ref 3.5–5.1)
Sodium: 136 mmol/L (ref 135–145)

## 2021-04-07 LAB — TROPONIN I (HIGH SENSITIVITY): Troponin I (High Sensitivity): <2 ng/L (ref <18)

## 2021-04-07 NOTE — Telephone Encounter (Signed)
Pt called in stating her face and hands are going numb. Pt also said down her arms too. Pt stated this has been going on since Saturday. Sent to access nurse

## 2021-04-07 NOTE — Discharge Instructions (Addendum)
Please take Tylenol and ibuprofen/Advil for your pain.  It is safe to take them together, or to alternate them every few hours.  Take up to 1000mg  of Tylenol at a time, up to 4 times per day.  Do not take more than 4000 mg of Tylenol in 24 hours.  For ibuprofen, take 400-600 mg, 4-5 times per day.  If you develop any focal weakness, severe chest pain, please return to the ED.

## 2021-04-07 NOTE — Telephone Encounter (Signed)
Patient did go to ED.  

## 2021-04-07 NOTE — ED Triage Notes (Signed)
Pt to ED via POV with c/o numbness that starts in her chest then goes down both arms and up into her head since Saturday. She feels like her heart is racing when this happens.

## 2021-04-07 NOTE — ED Provider Notes (Signed)
[facility] Provider Note    Event Date/Time   First MD Initiated Contact with Patient 04/07/21 1517     (approximate)   History   Numbness   HPI  Melissa Walton is a 31 y.o. female who presents to the ED for evaluation of Numbness   I review documented phone call to her PCP nurse practitioner where she was expressing concerns for numbness to her face and hands and arms for the past 2 days. I reviewed PCP visit from 1/9.  History of anxiety, obesity, GERD and migraine headaches.  Patient presents to the ED for evaluation of a few days paresthesias to her bilateral chest and arms.  She reports recently going to a new boxing gym and trying new exercise with them, and since that first new exercise she has had the symptoms of the past few days.  She reports the pain is positional, worse when she is moving her head or arms.  She reports bilateral paresthesias alongside a sore and tight chest and shoulders.  Denies any weakness to the extremities, denies changes to her ability to text.  She is ambulatory at her baseline and has no symptoms right now.   Physical Exam   Triage Vital Signs: ED Triage Vitals  Enc Vitals Group     BP 04/07/21 1308 128/88     Pulse Rate 04/07/21 1308 80     Resp 04/07/21 1308 16     Temp 04/07/21 1308 98.7 F (37.1 C)     Temp Source 04/07/21 1308 Oral     SpO2 04/07/21 1308 100 %     Weight 04/07/21 1309 196 lb (88.9 kg)     Height 04/07/21 1309 5\' 7"  (1.702 m)     Head Circumference --      Peak Flow --      Pain Score 04/07/21 1313 4     Pain Loc --      Pain Edu? --      Excl. in [place]? --     Most recent vital signs: Vitals:   04/07/21 1308  BP: 128/88  Pulse: 80  Resp: 16  Temp: 98.7 F (37.1 C)  SpO2: 100%    General: Awake, no distress.  CV:  Good peripheral perfusion. RRR Resp:  Normal effort.  Abd:  No distention.  MSK:  No deformity noted.  Mild muscular tenderness to bilateral superior anterior  chest wall, as well as bilateral trapezius muscles.  No overlying skin changes or signs of trauma. Neuro:  No focal deficits appreciated. Cranial nerves II through XII intact 5/5 strength and sensation in all 4 extremities Other:     ED Results / Procedures / Treatments   Labs (all labs ordered are listed, but only abnormal results are displayed) Labs Reviewed  BASIC METABOLIC PANEL  CBC  POC URINE PREG, ED  TROPONIN I (HIGH SENSITIVITY)  TROPONIN I (HIGH SENSITIVITY)    EKG Sinus rhythm, rate of 79 bpm.  Normal axis and intervals.  No evidence of acute ischemia.  RADIOLOGY   Official radiology report(s): No results found.  PROCEDURES and INTERVENTIONS:  Procedures  Medications - No data to display   IMPRESSION / MDM / [place] / ED COURSE  I reviewed the triage vital signs and the nursing notes.  Healthy 31 year old female presents to the ED with bilateral arm paresthesias, possibly due to muscular spasm from a new workout, ultimately suitable for outpatient management.  Normal vitals on room  air.  Normal and reassuring examination.  Has some minimal tenderness to bilateral anterior chest wall without overlying skin changes or signs of trauma, similarly sore to her bilateral trapezius musculatures.  Anticipate MSK etiology of her symptoms.  Doubt cardiopulmonary pathology or stroke or CNS pathology.  No current indications for CNS imaging.  Provided reassurance, discussed nonnarcotic multimodal analgesia and return precautions for the ED.      FINAL CLINICAL IMPRESSION(S) / ED DIAGNOSES   Final diagnoses:  Paresthesias     Rx / DC Orders   ED Discharge Orders     None        Note:  This document was prepared using Dragon voice recognition software and may include unintentional dictation errors.   Vladimir Crofts, MD 04/07/21 [phone]

## 2021-04-09 ENCOUNTER — Other Ambulatory Visit: Payer: Self-pay | Admitting: Nurse Practitioner

## 2021-04-09 DIAGNOSIS — K625 Hemorrhage of anus and rectum: Secondary | ICD-10-CM

## 2021-04-09 DIAGNOSIS — R1084 Generalized abdominal pain: Secondary | ICD-10-CM

## 2021-04-10 NOTE — Progress Notes (Signed)
NEUROLOGY FOLLOW UP OFFICE NOTE  RAFAELITA FOISTER [id number]  Assessment/Plan:   1  Migraine without aura, with status migrainosus, intractable 2  Dizziness/lightheadedness - unclear etiology.  Neurologic exam unremarkable.   Migraine prevention:  She will contact her insurance to find out if Ajovy or Emgality are covered and will let us know.  I would retry Ajovy since she had some mild improvement and hasn't really given it enough time (I recommend 4 month trial) Migraine rescue:  I will have her try Bernita Raisin as it will likely be more tolerable than sumatriptan.  Otherwise, she still has sumatriptan 100mg  with Zofran ODT 4mg  Limit use of pain relievers to no more than 2 days out of week to prevent risk of rebound or medication-overuse headache. Keep headache diary Follow up 6 months.       Subjective:  CALIA NAPP is a 31 year old right-handed female who follows up for migraines.  UPDATE: She stopped topiramate in August because she thought it was worsening her headaches.  Headaches were daily.  She stopped Aimovig in November and was given Ajovy samples instead.  She took it for 2 months but wasn't able to get it this month.  Uncertain if it was actually approved.  So she took Aimovig this month.  Migraines last 2-3 hours with sumatriptan and occur 3 to 4 days a week.  Doesn't remember if she tried September  Over the past week, she reports feeling constant lightheadedness.  No numbness, visual disturbance, weakness or worsening headache.  Went to the ED.  Felt palpitations but ER said heart rate was fine.  Her PCP has ordered MRI/MRA of brain scheduled for Wednesday.   Frequency of abortive medication: 3 to 4 times  week Current NSAIDS/analgesics:  none Current triptans:  sumatriptan 100mg  (causes panic attacks) Current ergotamine:  none Current anti-emetic:  Zofran Current muscle relaxants:  none Current Antihypertensive medications:  none Current Antidepressant  medications:  Sertraline 100mg  QD Current Anticonvulsant medications:  none Current anti-CGRP:  Aimovig Current Vitamins/Herbal/Supplements:  magnesium citrate 400mg  QD Current Antihistamines/Decongestants:  hydroxyzine Other therapy:  none Hormone/birth control:  none  Caffeine:  no coffee.  Soda every now and then Diet:  Does not drink enough water.  Skips meals Exercise:  no Depression:  controlled; Anxiety:  cpntrolled Other pain:  hip pain Sleep hygiene:  wakes up during the night  HISTORY: Migraines started 2018 and gradually got worse.  They are severe bifrontal/retroorbital/occipital pounding pain associated with nausea, photophobia, phonophobia, dizziness but no visual disturbance, vomiting, autonomic symptoms, numbness and weakness.  They would last 3-4 days with only one day of headache-freedom until next migraine.  She was started on topiramate a few months ago.  Headaches significantly improved - they were less severe, would last less than a day and occur once a week.  In June, she had a an intractable migraine lasting a week.  She had a second intractable migraine beginning July 2.  She received a Toradol shot at her PCP's office which was ineffective.  Maxalt helped ease it but did not break it.  She was seen in the ED on 10/02/2020 where she received a migraine cocktail followed by Haldol.  Headache broke.  She now has a lingering headache.   MRI of brain with and without contrast on 08/08/2020 personally reviewed was normal.      Past NSAIDS/analgesics:  Excedrin Migraine Past abortive triptans:  none Past abortive ergotamine:  none Past muscle relaxants:  Flexeril  Past anti-emetic:  none Past antihypertensive medications:  none Past antidepressant medications:  none Past anticonvulsant medications:  topiramate (side effects) Past anti-CGRP:  none Past vitamins/Herbal/Supplements:  none Past antihistamines/decongestants:  Zyrtec, Flonase Other past therapies:  none     Family history of headache:  brother  PAST MEDICAL HISTORY: Past Medical History:  Diagnosis Date   Anxiety    highly anxious   Complication of anesthesia    Took double anesthesia to keep her asleep, she stated "she is a red head"   Concussion    high school   Family history of adverse reaction to anesthesia    Grandfather had post op N&V, grandmother (red head woke up during surgery)   GERD (gastroesophageal reflux disease)    Headache 09/14/2020   constant since july2   Migraine    causes weakness in arms   Motion sickness    in back seat of car    MEDICATIONS: Current Outpatient Medications on File Prior to Visit  Medication Sig Dispense Refill   AIMOVIG 140 MG/ML SOAJ Inject 1 mL into the skin every 30 (thirty) days.     Cyanocobalamin 1000 MCG CAPS Take 1 capsule by mouth daily. 90 capsule 3   hydrocortisone (ANUSOL-HC) 25 MG suppository Place 1 suppository (25 mg total) rectally 2 (two) times daily. 12 suppository 0   hydrOXYzine (ATARAX/VISTARIL) 10 MG tablet TAKE 1 TABLET(10 MG) BY MOUTH AT BEDTIME 90 tablet 1   Magnesium Citrate 200 MG TABS Take 400 mg by mouth daily. 120 tablet 1   nitrofurantoin, macrocrystal-monohydrate, (MACROBID) 100 MG capsule Take 1 capsule (100 mg total) by mouth 2 (two) times daily. 10 capsule 0   ondansetron (ZOFRAN-ODT) 4 MG disintegrating tablet Take 4 mg by mouth every 8 (eight) hours as needed.     pantoprazole (PROTONIX) 20 MG tablet Take 1 tablet (20 mg total) by mouth 2 (two) times daily before a meal. 33mins-1hr before meal, lunch and dinner 90 tablet 1   SUMAtriptan (IMITREX) 100 MG tablet Take 100 mg by mouth as directed.     venlafaxine XR (EFFEXOR XR) 37.5 MG 24 hr capsule Take 1 capsule (37.5 mg total) by mouth daily with breakfast. 90 capsule 0   [DISCONTINUED] omeprazole (PRILOSEC OTC) 20 MG tablet Take 1 tablet (20 mg total) by mouth daily. 30 tablet 6   No current facility-administered medications on file prior to visit.     ALLERGIES: Allergies  Allergen Reactions   Nsaids     GI Bleed with aleve   Loracarbef Rash   Penicillins Rash    FAMILY HISTORY: Family History  Problem Relation Age of Onset   Diabetes Mother    Hyperlipidemia Mother    Hypertension Father    Gout Father    Heart disease Maternal Grandfather    Ovarian cancer Paternal Grandmother       Objective:  Blood pressure 113/67, pulse 88, height 5\' 7"  (1.702 m), weight 202 lb 9.6 oz (91.9 kg), SpO2 98 %. General: No acute distress.  Patient appears well-groomed.   Head:  Normocephalic/atraumatic Eyes:  Fundi examined but not visualized Neck: supple, no paraspinal tenderness, full range of motion Heart:  Regular rate and rhythm Lungs:  Clear to auscultation bilaterally Back: No paraspinal tenderness Neurological Exam: alert and oriented to person, place, and time.  Speech fluent and not dysarthric, language intact.  CN II-XII intact. Bulk and tone normal, muscle strength 5/5 throughout.  Sensation to light touch intact.  Deep tendon reflexes 2+  throughout, toes downgoing.  Finger to nose testing intact.  Gait normal, Romberg negative.   Shon MilletAdam Sameeha Rockefeller, DO  CC: Rennie PlowmanMargaret Arnett, FNP

## 2021-04-10 NOTE — Telephone Encounter (Signed)
LMTCB

## 2021-04-11 ENCOUNTER — Other Ambulatory Visit: Payer: Self-pay

## 2021-04-11 ENCOUNTER — Ambulatory Visit (INDEPENDENT_AMBULATORY_CARE_PROVIDER_SITE_OTHER): Payer: Managed Care, Other (non HMO) | Admitting: Neurology

## 2021-04-11 ENCOUNTER — Encounter: Payer: Self-pay | Admitting: Neurology

## 2021-04-11 VITALS — BP 113/67 | HR 88 | Ht 67.0 in | Wt 202.6 lb

## 2021-04-11 DIAGNOSIS — R42 Dizziness and giddiness: Secondary | ICD-10-CM

## 2021-04-11 DIAGNOSIS — G43011 Migraine without aura, intractable, with status migrainosus: Secondary | ICD-10-CM

## 2021-04-11 NOTE — Patient Instructions (Signed)
Check with insurance to find out which of the following medications is formulary and let us know:  Ajovy, Emgality Try Ubrelvy at earliest onset of headache.  May repeat in 2 hours (maximum 2 in 24 hours).  Let me know if effective Let me know when MRI is done so that I may review Follow up in 4 months

## 2021-04-11 NOTE — Progress Notes (Signed)
Medication Samples have been provided to the patient.  Drug name: Montey Hora       Strength: 100 mg        Qty: 4  LOT: [id number]  Exp.Date: 09/2021  Dosing instructions: as needed  The patient has been instructed regarding the correct time, dose, and frequency of taking this medication, including desired effects and most common side effects.   Leida Lauth 10:33 AM 04/11/2021

## 2021-04-14 ENCOUNTER — Encounter: Payer: Self-pay | Admitting: Family

## 2021-04-14 ENCOUNTER — Encounter: Payer: Self-pay | Admitting: Neurology

## 2021-04-15 ENCOUNTER — Other Ambulatory Visit: Payer: Self-pay | Admitting: Family

## 2021-04-15 DIAGNOSIS — F32A Depression, unspecified: Secondary | ICD-10-CM

## 2021-04-15 DIAGNOSIS — F419 Anxiety disorder, unspecified: Secondary | ICD-10-CM

## 2021-04-15 MED ORDER — SERTRALINE HCL 50 MG PO TABS: 50.0000 mg | ORAL_TABLET | Freq: Every day | ORAL | 3 refills | Status: DC

## 2021-04-15 NOTE — Telephone Encounter (Signed)
F/u  Received denial from [place] .   Fax additional information to [phone] &[phone]  Medication Aimovig 140 mg

## 2021-04-17 NOTE — Telephone Encounter (Signed)
F/u  I called patient insurance [employer] at [phone] and spoke with Herbert Seta advised all injector medication will require prior authorization did not specific which CGRP is not covered.   Per Herbert Seta, tablet medication has a certain quantity of limited

## 2021-04-22 ENCOUNTER — Telehealth: Payer: Self-pay | Admitting: Family

## 2021-04-22 NOTE — Telephone Encounter (Signed)
LMTCB

## 2021-04-22 NOTE — Telephone Encounter (Signed)
FYI Dr Everlena Cooper and thanks for seeing Ms Blomquist last month  MRI brain performed at [facility] 04/16/21 which was negative for acute findings, Impression is unremarkable.   I am not sure if you can assess actual image through Care Everywhere as I was faxed document with impression.   Sarah, please let pt know above results of MRI brain and see how she is doing. We have follow up with her on 05/05/21.

## 2021-04-23 NOTE — Telephone Encounter (Signed)
I have called patient again & sent mychart message.

## 2021-04-24 HISTORY — PX: COLONOSCOPY: SHX174

## 2021-04-24 HISTORY — PX: UPPER GI ENDOSCOPY: SHX6162

## 2021-04-24 NOTE — Progress Notes (Signed)
Pt saw Fransico Setters, NP 1/24.

## 2021-04-30 ENCOUNTER — Encounter: Payer: Self-pay | Admitting: Family

## 2021-05-05 ENCOUNTER — Ambulatory Visit: Payer: Managed Care, Other (non HMO) | Admitting: Family

## 2021-05-23 ENCOUNTER — Other Ambulatory Visit: Payer: Self-pay

## 2021-05-23 ENCOUNTER — Ambulatory Visit (INDEPENDENT_AMBULATORY_CARE_PROVIDER_SITE_OTHER): Payer: Managed Care, Other (non HMO) | Admitting: Family

## 2021-05-23 ENCOUNTER — Encounter: Payer: Self-pay | Admitting: Family

## 2021-05-23 VITALS — BP 128/70 | HR 81 | Temp 98.2°F | Ht 67.0 in | Wt 201.8 lb

## 2021-05-23 DIAGNOSIS — F419 Anxiety disorder, unspecified: Secondary | ICD-10-CM

## 2021-05-23 DIAGNOSIS — R1084 Generalized abdominal pain: Secondary | ICD-10-CM

## 2021-05-23 DIAGNOSIS — K219 Gastro-esophageal reflux disease without esophagitis: Secondary | ICD-10-CM

## 2021-05-23 DIAGNOSIS — F32A Depression, unspecified: Secondary | ICD-10-CM

## 2021-05-23 DIAGNOSIS — R198 Other specified symptoms and signs involving the digestive system and abdomen: Secondary | ICD-10-CM

## 2021-05-23 MED ORDER — SERTRALINE HCL 100 MG PO TABS: 100.0000 mg | ORAL_TABLET | Freq: Every day | ORAL | 1 refills | Status: DC

## 2021-05-23 NOTE — Patient Instructions (Addendum)
Eat smaller more frequent meals.   Increase protonix to  twice per day; continue Pepcid AC 20 mg at bedtime  Start metamucil powder over the counter 3.4 g once to twice daily to bulk stool and prevent diarrhea  Referral to GI for second opinion Please bring colonoscopy and upper endoscopy reports to our office as well as to GI appointment    It is MOST important to drink LOTS of water and follow a HIGH fiber diet to keep foods moving through the gut. You may add Metamucil to a beverage that you drink.  Information on prevention of constipation as well as acute treatment for constipation as included below.  If there is no improvement in your symptoms, or if there is any worsening of symptoms, or if you have any additional concerns, please return to this clinic for re-evaluation; or, if we are closed, consider going to the Emergency Room for evaluation.    Constipation Prevention What is Constipation? Constipation is hard, dry bowel movements or the inability to have a bowel movement.  You can also feel like you need to have a bowel movement but not be able to.  It can also be painful when you strain to have a bowel movement.  Taking narcotic pain medicine after surgery can make you constipated, even if you have never had a problem with constipation. What Do I Need To Do? The best thing to do for constipation is to keep it from happening.  This can be done by: Adding laxatives to your daily routine, when taking prescription pain medicines after surgery. Add 17 gm Miralax daily or 100 mg Colace once or twice daily. (Miralax is mixed in water. Colace is a pill). They soften your bowel movements to make them easier to pass and hurt less. Drink plenty of water to help flush your bowels.  (Eight, 8 ounce glasses daily) Eat foods high in fiber such as whole grains, vegetables, cereals, fruits, and prune juice (5-7 servings a day or 25 grams).  If you do not know how much fiber a food has in it  you can look on the label under dietary fiber.  If you have trouble getting enough fiber in your diet you may want to consider a fiber supplement such as Metamucil or Citrucel.  Also, be aware that eating fiber without drinking enough water can make constipation worse. If you do become constipated some medications that may help are: Bisacodyl (Dulcolax) is available in tablet form or a suppository. Glycerin suppositories are also a good choice if you need a fast acting medication. Everybody is different and may have different results.  Talk to your pharmacist or health care provider about your specific problems. They can help you choose the best product for you.  Why Is It Important for Me To Do This? Being constipated is not something you have to live with.  There are many things you can do to help.   Feeling bad can interfere with your recovery after surgery.  If constipation goes on for too long it can become a very serious medical problem. You may need to visit your doctor or go to the hospital.  That is why it is very important to drink lots of water, eat enough fiber, and keep it from happening.  Ask Questions We want to answer all of your questions and concerns.  Thats why we encourage you to use a program called Ask Me 3, created by the [employer].  By  using Ask Me 3 you are encouraged to ask 3 simple (yet, potentially life saving questions) whenever you are talking with your physician, nurse or pharmacist: What is my main problem? What do I need to do? Why is it important for me to do this? By understanding the answer to these three questions and any other questions you may have, you have the knowledge necessary to manage your health. Please feel very comfortable asking any questions. Healthcare is complicated, so if you hear an answer you do not understand, please ask your health care team to explain again.   Sources: [employer] Med[employer]  Plus 01-02-10 N    Low-FODMAP Eating Plan FODMAP stands for fermentable oligosaccharides, disaccharides, monosaccharides, and polyols. These are sugars that are hard for some people to digest. A low-FODMAP eating plan may help some people who have irritable bowel syndrome (IBS) and certain other bowel (intestinal) diseases to manage their symptoms. This meal plan can be complicated to follow. Work with a diet and nutrition specialist (dietitian) to make a low-FODMAP eating plan that is right for you. A dietitian can help make sure that you get enough nutrition from this diet. What are tips for following this plan? Reading food labels Check labels for hidden FODMAPs such as: High-fructose syrup. Honey. Agave. Natural fruit flavors. Onion or garlic powder. Choose low-FODMAP foods that contain 3-4 grams of fiber per serving. Check food labels for serving sizes. Eat only one serving at a time to make sure FODMAP levels stay low. Shopping Shop with a list of foods that are recommended on this diet and make a meal plan. Meal planning Follow a low-FODMAP eating plan for up to 6 weeks, or as told by your health care provider or dietitian. To follow the eating plan: Eliminate high-FODMAP foods from your diet completely. Choose only low-FODMAP foods to eat. You will do this for 2-6 weeks. Gradually reintroduce high-FODMAP foods into your diet one at a time. Most people should wait a few days before introducing the next new high-FODMAP food into their meal plan. Your dietitian can recommend how quickly you may reintroduce foods. Keep a daily record of what and how much you eat and drink. Make note of any symptoms that you have after eating. Review your daily record with a dietitian regularly to identify which foods you can eat and which foods you should avoid. General tips Drink enough fluid each day to keep your urine pale yellow. Avoid processed foods. These often have added sugar and may be  high in FODMAPs. Avoid most dairy products, whole grains, and sweeteners. Work with a dietitian to make sure you get enough fiber in your diet. Avoid high FODMAP foods at meals to manage symptoms. Recommended foods Fruits Bananas, oranges, tangerines, lemons, limes, blueberries, raspberries, strawberries, grapes, cantaloupe, honeydew melon, kiwi, papaya, passion fruit, and pineapple. Limited amounts of dried cranberries, banana chips, and shredded coconut. Vegetables Eggplant, zucchini, cucumber, peppers, green beans, bean sprouts, lettuce, arugula, kale, Swiss chard, spinach, collard greens, bok choy, summer squash, potato, and tomato. Limited amounts of corn, carrot, and sweet potato. Green parts of scallions. Grains Gluten-free grains, such as rice, oats, buckwheat, quinoa, corn, polenta, and millet. Gluten-free pasta, bread, or cereal. Rice noodles. Corn tortillas. Meats and other proteins Unseasoned beef, pork, poultry, or fish. Eggs. Tomasa Blase. Tofu (firm) and tempeh. Limited amounts of nuts and seeds, such as almonds, walnuts, [place] nuts, pecans, peanuts, nut butters, pumpkin seeds, chia seeds, and sunflower seeds. Dairy Lactose-free milk, yogurt, and kefir.  Lactose-free cottage cheese and ice cream. Non-dairy milks, such as almond, coconut, hemp, and rice milk. Non-dairy yogurt. Limited amounts of goat cheese, brie, mozzarella, parmesan, swiss, and other hard cheeses. Fats and oils Butter-free spreads. Vegetable oils, such as olive, canola, and sunflower oil. Seasoning and other foods Artificial sweeteners with names that do not end in "ol," such as aspartame, saccharine, and stevia. Maple syrup, white table sugar, raw sugar, brown sugar, and molasses. Mayonnaise, soy sauce, and tamari. Fresh basil, coriander, parsley, rosemary, and thyme. Beverages Water and mineral water. Sugar-sweetened soft drinks. Small amounts of orange juice or cranberry juice. Black and green tea. Most dry wines.  Coffee. The items listed above may not be a complete list of foods and beverages you can eat. Contact a dietitian for more information. Foods to avoid Fruits Fresh, dried, and juiced forms of apple, pear, watermelon, peach, plum, cherries, apricots, blackberries, boysenberries, figs, nectarines, and mango. Avocado. Vegetables Chicory root, artichoke, asparagus, cabbage, snow peas, Brussels sprouts, broccoli, sugar snap peas, mushrooms, celery, and cauliflower. Onions, garlic, leeks, and the white part of scallions. Grains Wheat, including kamut, durum, and semolina. Barley and bulgur. Couscous. Wheat-based cereals. Wheat noodles, bread, crackers, and pastries. Meats and other proteins Fried or fatty meat. Sausage. Cashews and pistachios. Soybeans, baked beans, black beans, chickpeas, kidney beans, fava beans, navy beans, lentils, black-eyed peas, and split peas. Dairy Milk, yogurt, ice cream, and soft cheese. Cream and sour cream. Milk-based sauces. Custard. Buttermilk. Soy milk. Seasoning and other foods Any sugar-free gum or candy. Foods that contain artificial sweeteners such as sorbitol, mannitol, isomalt, or xylitol. Foods that contain honey, high-fructose corn syrup, or agave. Bouillon, vegetable stock, beef stock, and chicken stock. Garlic and onion powder. Condiments made with onion, such as hummus, chutney, pickles, relish, salad dressing, and salsa. Tomato paste. Beverages Chicory-based drinks. Coffee substitutes. Chamomile tea. Fennel tea. Sweet or fortified wines such as port or sherry. Diet soft drinks made with isomalt, mannitol, maltitol, sorbitol, or xylitol. Apple, pear, and mango juice. Juices with high-fructose corn syrup. The items listed above may not be a complete list of foods and beverages you should avoid. Contact a dietitian for more information. Summary FODMAP stands for fermentable oligosaccharides, disaccharides, monosaccharides, and polyols. These are sugars that are  hard for some people to digest. A low-FODMAP eating plan is a short-term diet that helps to ease symptoms of certain bowel diseases. The eating plan usually lasts up to 6 weeks. After that, high-FODMAP foods are reintroduced gradually and one at a time. This can help you find out which foods may be causing symptoms. A low-FODMAP eating plan can be complicated. It is best to work with a dietitian who has experience with this type of plan. This information is not intended to replace advice given to you by your health care provider. Make sure you discuss any questions you have with your health care provider. Document Revised: 07/20/2019 Document Reviewed: 07/20/2019 Elsevier Patient Education  2022 [employer].

## 2021-05-23 NOTE — Progress Notes (Signed)
? ?Subjective:  ? ? Patient ID: Melissa Walton, female    DOB: 1990-12-08, 31 y.o.   MRN: [id number] ? ?CC: Melissa Walton is a 31 y.o. female who presents today for follow up.  ? ?HPI: She continues to have varying constipation and diarrhea. 'It is never normal. ' She has dull ache in stomach with 'a lot of gas bubbles' most of the time. Pain is 'not bad today.'  ?She avoids lactose. ? ?No sharp abdominal pain, fever, dysuria.  ?Rectal bleeding hasnt occurred in the past couple of weeks.  She is not using Preparation H. ? ?She continues to have epigastric burning, belching, burping.  She is compliant with Protonix 20 mg once per day.  She takes Pepcid daily as well ? ?She reports internal hemorrhoids seen on colonoscopy and intestinal metaplasia in EGD ( unable to see reports). ? ?No alcohol use.  ? ? ? ? ? ? ? ? ?Anxiety depression-compliant with Zoloft 100 mg .  Feels well on this dose. ? ?headache-continues to follow with neurology, Dr. Everlena Cooper. She takes imitrex for rescue.  She tried Ajovy, Emgality without relief. ?Consult with gastroenterology 04/08/2021 for bright red bleeding from rectum. ?CT abdomen and pelvis ( this was not done), colonoscopy, upper endoscopy ?Advised to use preparation cream, continue pantoprazole 40 mg daily, famotidine 20 mg at bedtime or as needed. ?Dr Mia Creek colonoscopy and EGD 04/24/21 ( unable to see result in epic)  ?EGD repeat in 5 years due to family history of stomach cancer ? ?HISTORY:  ?Past Medical History:  ?Diagnosis Date  ? Anxiety   ? highly anxious  ? Complication of anesthesia   ? Took double anesthesia to keep her asleep, she stated "she is a red head"  ? Concussion   ? high school  ? Family history of adverse reaction to anesthesia   ? Grandfather had post op N&V, grandmother (red head woke up during surgery)  ? GERD (gastroesophageal reflux disease)   ? Headache 09/14/2020  ? constant since july2  ? Migraine   ? causes weakness in arms  ? Motion sickness   ? in  back seat of car  ? ?Past Surgical History:  ?Procedure Laterality Date  ? SEPTOPLASTY Bilateral 10/31/2020  ? Procedure: SEPTOPLASTY;  Surgeon: Vernie Murders, MD;  Location: [facility];  Service: ENT;  Laterality: Bilateral;  ? TURBINATE REDUCTION Bilateral 10/31/2020  ? Procedure: TURBINATE REDUCTION;  Surgeon: Vernie Murders, MD;  Location: [facility];  Service: ENT;  Laterality: Bilateral;  ? UPPER GI ENDOSCOPY    ? WISDOM TOOTH EXTRACTION    ? ?Family History  ?Problem Relation Age of Onset  ? Diabetes Mother   ? Hyperlipidemia Mother   ? Hypertension Father   ? Gout Father   ? Heart disease Maternal Grandfather   ? Ovarian cancer Paternal Grandmother   ? ? ?Allergies: Nsaids, Loracarbef, and Penicillins ?Current Outpatient Medications on File Prior to Visit  ?Medication Sig Dispense Refill  ? hydrOXYzine (ATARAX/VISTARIL) 10 MG tablet TAKE 1 TABLET(10 MG) BY MOUTH AT BEDTIME 90 tablet 1  ? ondansetron (ZOFRAN-ODT) 4 MG disintegrating tablet Take 4 mg by mouth every 8 (eight) hours as needed.    ? pantoprazole (PROTONIX) 20 MG tablet Take 1 tablet (20 mg total) by mouth 2 (two) times daily before a meal. 65mins-1hr before meal, lunch and dinner 90 tablet 1  ? SUMAtriptan (IMITREX) 100 MG tablet Take 100 mg by mouth as directed.    ? [DISCONTINUED]  omeprazole (PRILOSEC OTC) 20 MG tablet Take 1 tablet (20 mg total) by mouth daily. 30 tablet 6  ? ?No current facility-administered medications on file prior to visit.  ? ? ?Social History  ? ?Tobacco Use  ? Smoking status: Never  ? Smokeless tobacco: Never  ?Substance Use Topics  ? Alcohol use: Yes  ?  Comment: occasionnally  ? Drug use: No  ? ? ?Review of Systems  ?Constitutional:  Negative for chills, fever and unexpected weight change.  ?HENT:  Negative for congestion.   ?Respiratory:  Negative for cough.   ?Cardiovascular:  Negative for chest pain, palpitations and leg swelling.  ?Gastrointestinal:  Negative for nausea and vomiting.   ?Musculoskeletal:  Negative for arthralgias and myalgias.  ?Skin:  Negative for rash.  ?Neurological:  Negative for headaches.  ?Hematological:  Negative for adenopathy.  ?Psychiatric/Behavioral:  Negative for confusion.   ?   ?Objective:  ?  ?BP 128/70 (BP Location: Left Arm, Patient Position: Sitting, Cuff Size: Normal)   Pulse 81   Temp 98.2 ?F (36.8 ?C) (Oral)   Ht 5\' 7"  (1.702 m)   Wt 201 lb 12.8 oz (91.5 kg)   LMP 05/15/2021 (Exact Date)   SpO2 98%   BMI 31.61 kg/m?  ?BP Readings from Last 3 Encounters:  ?05/23/21 128/70  ?04/11/21 113/67  ?04/07/21 120/80  ? ?Wt Readings from Last 3 Encounters:  ?05/23/21 201 lb 12.8 oz (91.5 kg)  ?04/11/21 202 lb 9.6 oz (91.9 kg)  ?04/07/21 196 lb (88.9 kg)  ? ? ?Physical Exam ?Vitals reviewed.  ?Constitutional:   ?   Appearance: Normal appearance. She is well-developed.  ?Eyes:  ?   Conjunctiva/sclera: Conjunctivae normal.  ?Cardiovascular:  ?   Rate and Rhythm: Normal rate and regular rhythm.  ?   Pulses: Normal pulses.  ?   Heart sounds: Normal heart sounds.  ?Pulmonary:  ?   Effort: Pulmonary effort is normal.  ?   Breath sounds: Normal breath sounds. No wheezing, rhonchi or rales.  ?Abdominal:  ?   General: Bowel sounds are normal. There is no distension.  ?   Palpations: Abdomen is soft. Abdomen is not rigid. There is no fluid wave or mass.  ?   Tenderness: There is no abdominal tenderness. There is no guarding or rebound.  ?Skin: ?   General: Skin is warm and dry.  ?Neurological:  ?   Mental Status: She is alert.  ?Psychiatric:     ?   Speech: Speech normal.     ?   Behavior: Behavior normal.     ?   Thought Content: Thought content normal.  ? ? ?   ?Assessment & Plan:  ? ?Problem List Items Addressed This Visit   ? ?  ? Digestive  ? GERD (gastroesophageal reflux disease)  ?  Suboptimal control.  Increase Protonix to 20 mg twice daily before meals.  She will continue Pepcid AC 20 mg.  Noted in chart that repeat EGD is due 04/2026.Close follow up.  ?  ?  ?  ?  Other  ? Alternating constipation and diarrhea - Primary  ?  Uncontrolled. Benign exam today. Differentials include:  gastroparesis, irritible bowel syndrome, suboptimal control of GERD. She has h/o lactose intolerance.  Awaiting report from [facility].  Referral to  GI for chronic diarrhea constipation,abdominal bloating.  Intestinal metaplasia seen on upper scope per patient ( awaiting records). .  Family history of stomach cancer. ?Advised FODMAP diet.  Start Metamucil as a bulking agent.  continue to avoid lactose .consider nortriptyline for IBS, headache prevention.  CT abdomen and pelvis ordered as well. Close follow up.  ?  ?  ? Relevant Orders  ? Ambulatory referral to Gastroenterology  ? CT ABDOMEN PELVIS W CONTRAST  ? Anxiety and depression  ?  Chronic, stable.  Continue Zoloft 100 mg ?  ?  ? Relevant Medications  ? sertraline (ZOLOFT) 100 MG tablet  ? ?Other Visit Diagnoses   ? ? Generalized abdominal pain      ? Relevant Orders  ? CT ABDOMEN PELVIS W CONTRAST  ? ?  ? ? ? ?I have discontinued Safiyya L. Maue's Aimovig, Magnesium Citrate, hydrocortisone, and nitrofurantoin (macrocrystal-monohydrate). I have also changed her sertraline. Additionally, I am having her maintain her hydrOXYzine, SUMAtriptan, ondansetron, and pantoprazole. ? ? ?Meds ordered this encounter  ?Medications  ? sertraline (ZOLOFT) 100 MG tablet  ?  Sig: Take 1 tablet (100 mg total) by mouth at bedtime.  ?  Dispense:  90 tablet  ?  Refill:  1  ?  Order Specific Question:   Supervising Provider  ?  Answer:   Sherlene Shams [[id number]]  ? ? ?Return precautions given.  ? ?Risks, benefits, and alternatives of the medications and treatment plan prescribed today were discussed, and patient expressed understanding.  ? ?Education regarding symptom management and diagnosis given to patient on AVS. ? ?Continue to follow with [name], FNP for routine health maintenance.  ? ?Melissa Walton and I  agreed with plan.  ? ?Rennie Plowman, FNP ? ? ?

## 2021-05-23 NOTE — Assessment & Plan Note (Signed)
Chronic, stable.  Continue Zoloft 100 mg ?

## 2021-05-23 NOTE — Progress Notes (Signed)
Patient stated that she needs a referral for Gastro for second opinion ?

## 2021-05-23 NOTE — Assessment & Plan Note (Addendum)
Suboptimal control.  Increase Protonix to 20 mg twice daily before meals.  She will continue Pepcid AC 20 mg.  Noted in chart that repeat EGD is due 04/2026.Close follow up.  ?

## 2021-05-23 NOTE — Assessment & Plan Note (Addendum)
Uncontrolled. Benign exam today. Differentials include:  gastroparesis, irritible bowel syndrome, suboptimal control of GERD. She has h/o lactose intolerance.  Awaiting report from colonoscopy and upper scope done at [facility].  Referral to [facility] for chronic diarrhea constipation,abdominal bloating.  Intestinal metaplasia seen on upper scope per patient ( awaiting records). .  Family history of stomach cancer. ?Advised FODMAP diet.  Start Metamucil as a bulking agent.  continue to avoid lactose .consider nortriptyline for IBS, headache prevention.  CT abdomen and pelvis ordered as well. Close follow up.  ?

## 2021-05-27 ENCOUNTER — Encounter: Payer: Self-pay | Admitting: Neurology

## 2021-05-29 ENCOUNTER — Telehealth: Payer: Self-pay | Admitting: Internal Medicine

## 2021-05-29 ENCOUNTER — Encounter: Payer: Self-pay | Admitting: Family

## 2021-05-29 NOTE — Telephone Encounter (Signed)
Hey Dr. Leone Payor,  ? ?We received a referral in our office for changes in bowel habits. I have spoken with patient and she would like to have a second opinion. She was recently seen at [facility] 03/2021. Records are in Epic. Could you please review and advise on scheduling? ? ?Thank you ?

## 2021-05-29 NOTE — Telephone Encounter (Signed)
I will see the patient but we need to have her records of colonoscopy and EGD - looks like she was seen at [facility] but had procedures at a surgical center not in Epic - apparently her [facility] PCP is obtaining those records ?

## 2021-05-30 ENCOUNTER — Telehealth: Payer: Self-pay | Admitting: Family

## 2021-05-30 NOTE — Telephone Encounter (Signed)
Lft pt vm to call ofc to sch CT. thanks 

## 2021-06-02 ENCOUNTER — Encounter: Payer: Self-pay | Admitting: Family

## 2021-06-03 ENCOUNTER — Other Ambulatory Visit: Payer: Self-pay

## 2021-06-03 DIAGNOSIS — H6690 Otitis media, unspecified, unspecified ear: Secondary | ICD-10-CM

## 2021-06-03 DIAGNOSIS — R519 Headache, unspecified: Secondary | ICD-10-CM

## 2021-06-04 NOTE — Telephone Encounter (Signed)
Left vm to return call. Need records from colonoscopy and egd  ?

## 2021-06-12 ENCOUNTER — Encounter: Payer: Self-pay | Admitting: Internal Medicine

## 2021-06-12 ENCOUNTER — Other Ambulatory Visit: Payer: Self-pay

## 2021-06-12 ENCOUNTER — Ambulatory Visit
Admission: RE | Admit: 2021-06-12 | Discharge: 2021-06-12 | Disposition: A | Discharge status: Home / Self Care | Payer: Managed Care, Other (non HMO) | Source: Ambulatory Visit | Attending: Family | Admitting: Family

## 2021-06-12 DIAGNOSIS — R198 Other specified symptoms and signs involving the digestive system and abdomen: Secondary | ICD-10-CM | POA: Insufficient documentation

## 2021-06-12 DIAGNOSIS — R1084 Generalized abdominal pain: Secondary | ICD-10-CM | POA: Diagnosis present

## 2021-06-12 IMAGING — CT CT ABD-PELV W/ CM
2 of 4 series · 16 of 46 positions shown, 18 images · IV contrast (agent unspecified)
Comparison: None.

CLINICAL DATA: Left lower quadrant abdominal pain

EXAM:
CT ABDOMEN AND PELVIS WITH CONTRAST
TECHNIQUE: Multidetector CT imaging of the abdomen and pelvis was performed
using the standard protocol following bolus administration of
intravenous contrast.

[Series 2: abd pelvis 5.00 · axial · 0.67mm/px · z∈[-1582,-1112]mm · 13 of 104 slices shown, 15 images]
[im 5/104  soft-tissue]
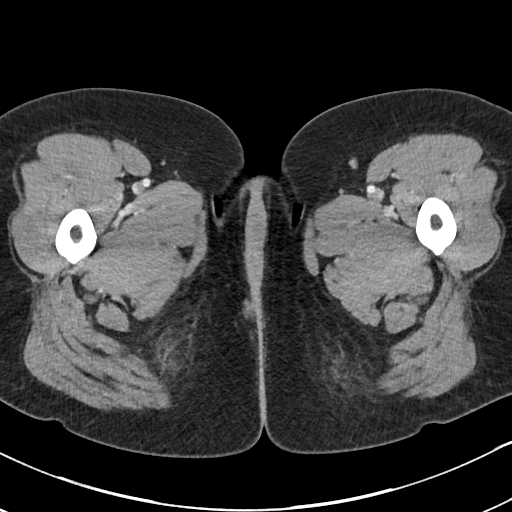
[im 5/104  bone]
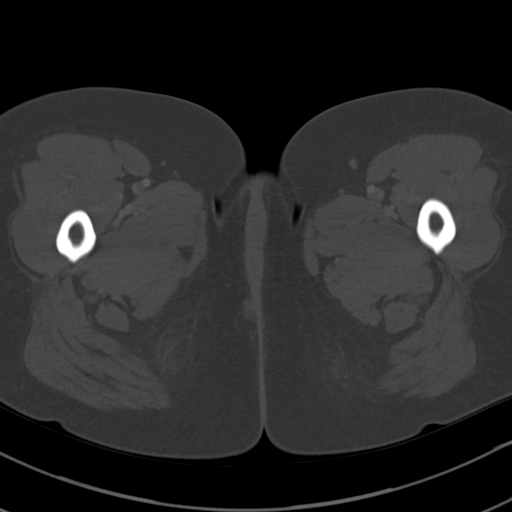
[im 13/104  soft-tissue]
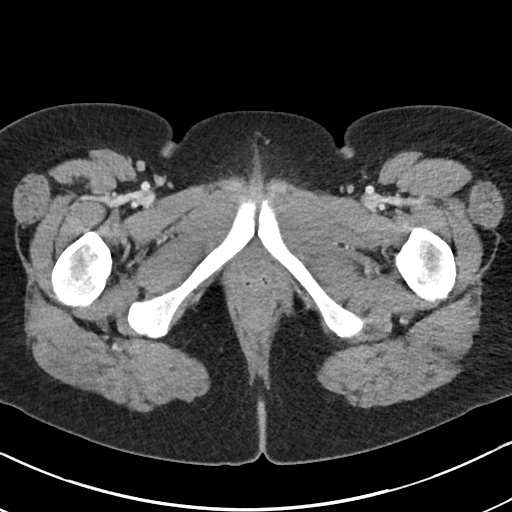
[im 21/104  soft-tissue]
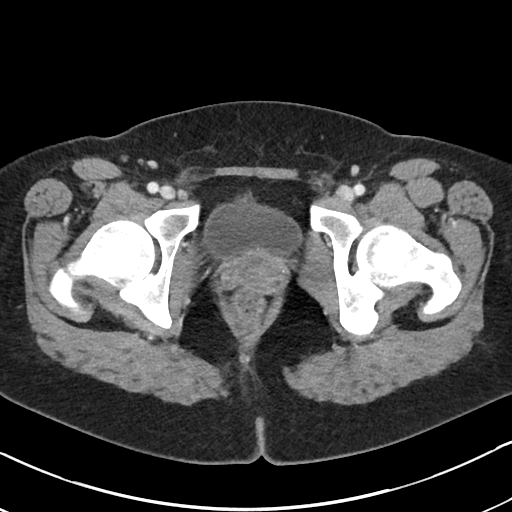
[im 29/104  soft-tissue]
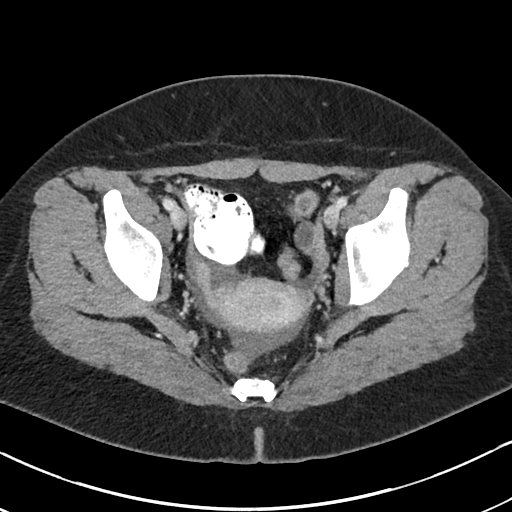
[im 38/104  soft-tissue]
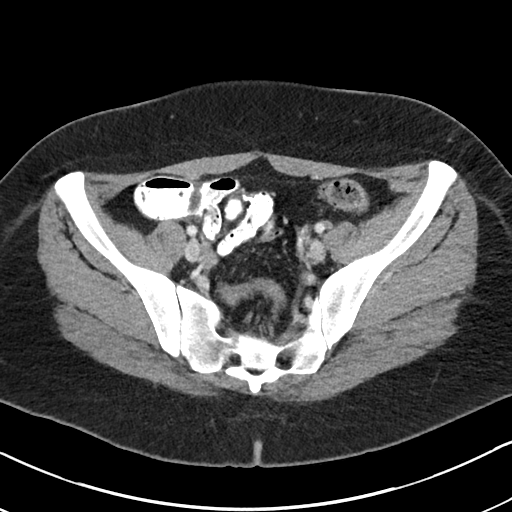
[im 46/104  soft-tissue]
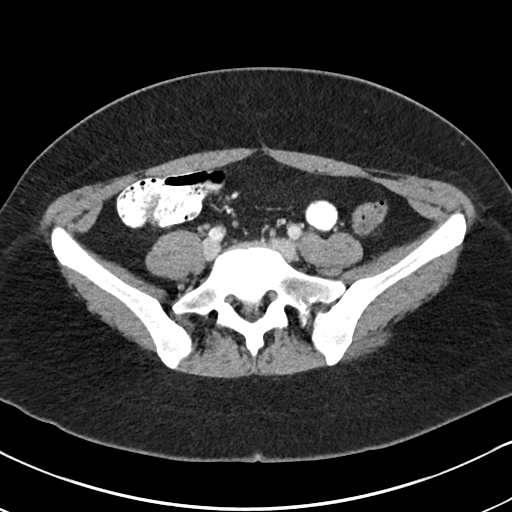
[im 54/104  soft-tissue]
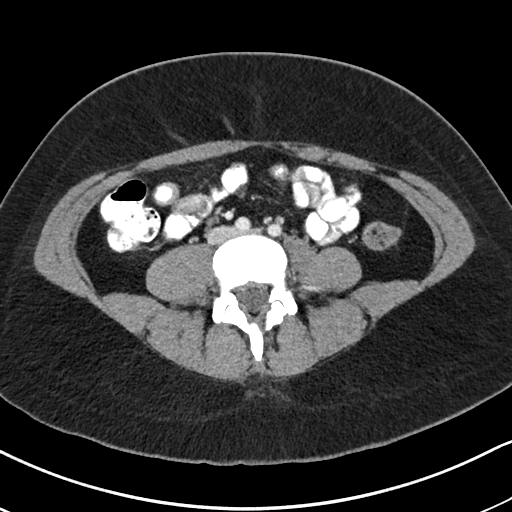
[im 58/104  soft-tissue]
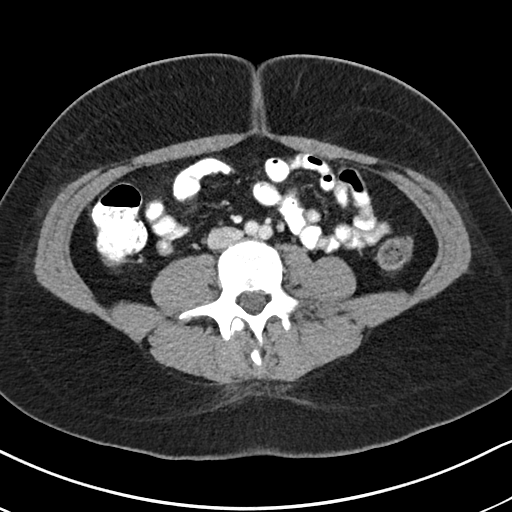
[im 66/104  soft-tissue]
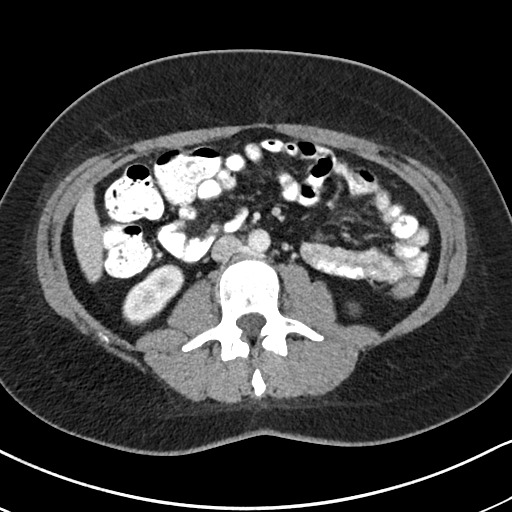
[im 66/104  bone]
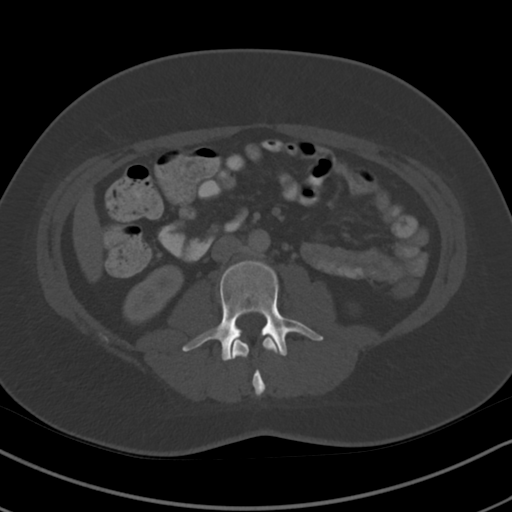
[im 75/104  soft-tissue]
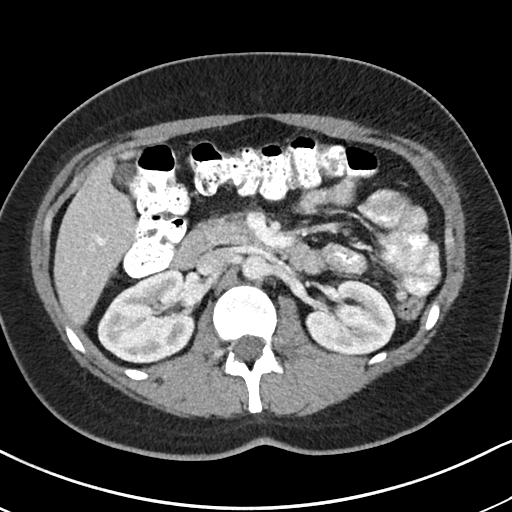
[im 83/104  soft-tissue]
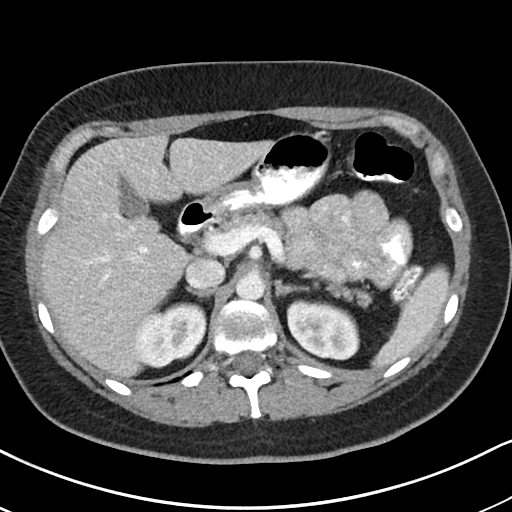
[im 91/104  soft-tissue]
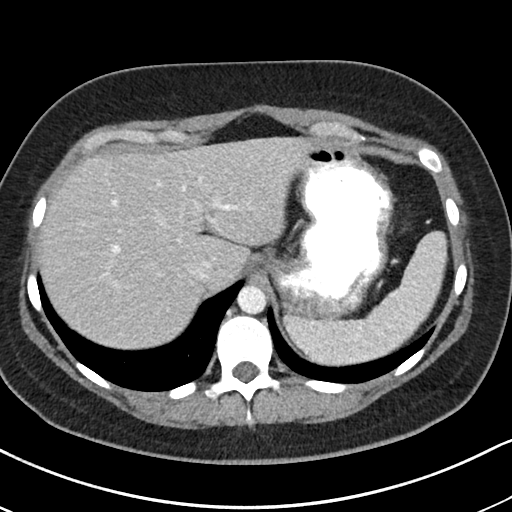
[im 99/104  soft-tissue]
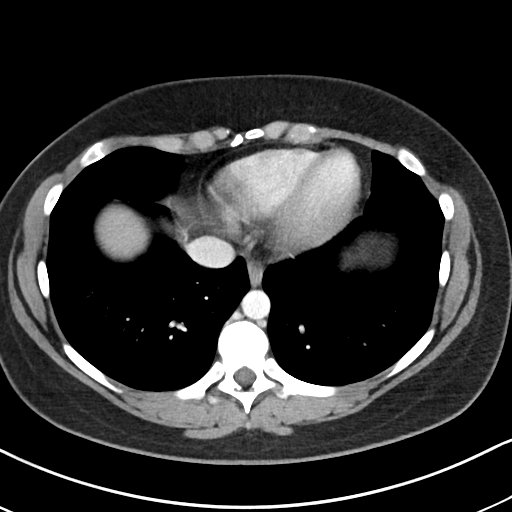

[Series 4: coronals abd pelvis 2.00 cor · coronal · 0.67mm/px · 3 of 144 slices shown]
[im 48/144  soft-tissue]
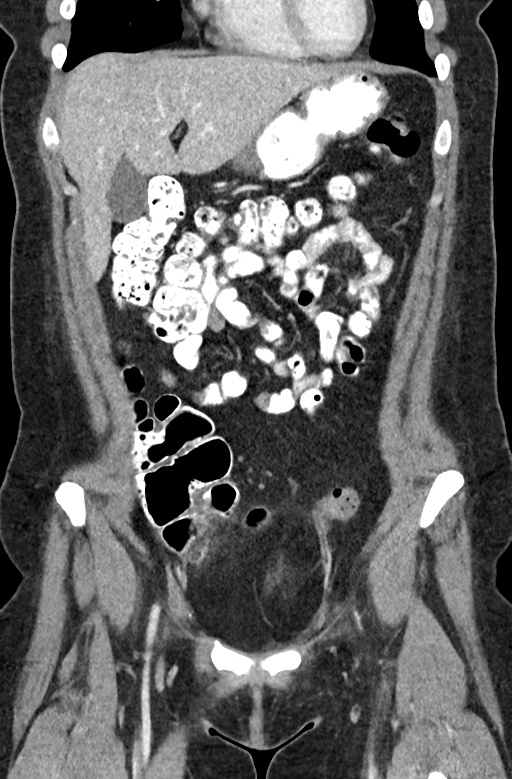
[im 64/144  soft-tissue]
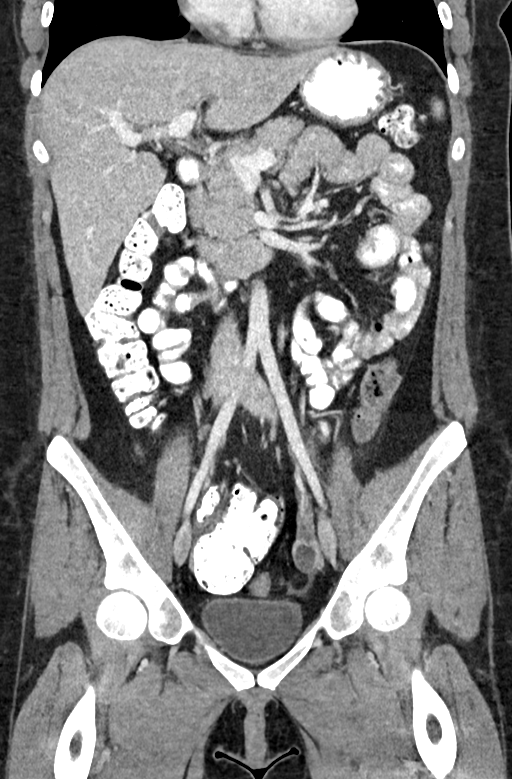
[im 80/144  soft-tissue]
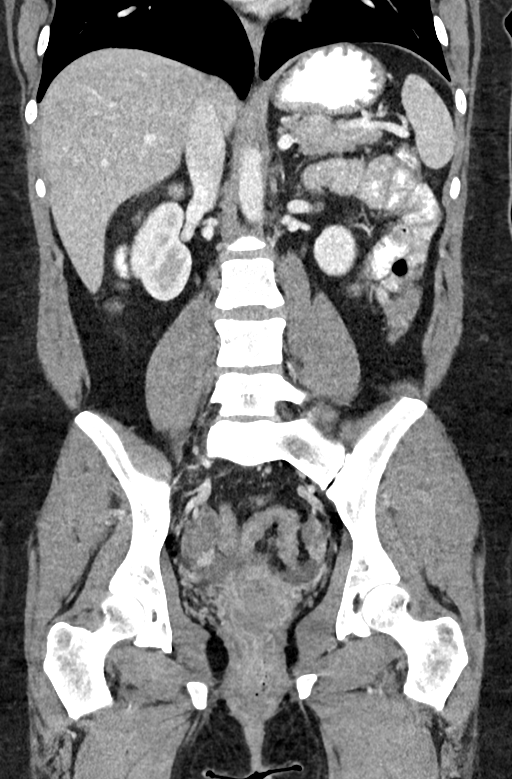

[16 of 46 positions shown; findings below may reference images not displayed]

RADIATION DOSE REDUCTION: This exam was performed according to the
departmental dose-optimization program which includes automated
exposure control, adjustment of the mA and/or kV according to
patient size and/or use of iterative reconstruction technique.

CONTRAST:  100mL OMNIPAQUE IOHEXOL 300 MG/ML  SOLN
FINDINGS: Lower chest: No acute abnormality

Hepatobiliary: No focal hepatic abnormality. Gallbladder
unremarkable.

Pancreas: No focal abnormality or ductal dilatation.

Spleen: No focal abnormality.  Normal size.

Adrenals/Urinary Tract: No adrenal abnormality. No focal renal
abnormality. No stones or hydronephrosis. Urinary bladder is
unremarkable.

Stomach/Bowel: Stomach, large and small bowel grossly unremarkable.
Normal appendix

Vascular/Lymphatic: No evidence of aneurysm or adenopathy.

Reproductive: Bicornuate uterus. No adnexal mass. Collapsing cyst or
follicle in the left ovary measures 2.1 cm.

Other: Small amount of free fluid in the pelvis.  No free air.

Musculoskeletal: No acute bony abnormality.
IMPRESSION: No acute findings in the abdomen or pelvis.

## 2021-06-12 MED ORDER — IOHEXOL 300 MG/ML  SOLN
100.0000 mL | Freq: Once | INTRAMUSCULAR | Status: AC | PRN
  Administered 2021-06-12: 100 mL via INTRAVENOUS

## 2021-06-12 NOTE — Telephone Encounter (Signed)
Patient was scheduled for 6/1 at 9:10. Stated that she had records from past procedures and she would bring them in for her OV. ?

## 2021-06-15 ENCOUNTER — Other Ambulatory Visit: Payer: Self-pay | Admitting: Family

## 2021-06-15 DIAGNOSIS — K219 Gastro-esophageal reflux disease without esophagitis: Secondary | ICD-10-CM

## 2021-06-16 ENCOUNTER — Telehealth: Payer: Managed Care, Other (non HMO) | Admitting: Physician Assistant

## 2021-06-16 DIAGNOSIS — J019 Acute sinusitis, unspecified: Secondary | ICD-10-CM

## 2021-06-16 DIAGNOSIS — B9789 Other viral agents as the cause of diseases classified elsewhere: Secondary | ICD-10-CM | POA: Diagnosis not present

## 2021-06-16 MED ORDER — IPRATROPIUM BROMIDE 0.03 % NA SOLN: 2.0000 | Freq: Two times a day (BID) | NASAL | 0 refills | Status: DC

## 2021-06-16 NOTE — Progress Notes (Signed)
E-Visit for Sinus Problems ? ?We are sorry that you are not feeling well.  Here is how we plan to help! ? ?Based on what you have shared with me it looks like you have sinusitis.  Sinusitis is inflammation and infection in the sinus cavities of the head.  Based on your presentation I believe you most likely have Acute Viral Sinusitis.This is an infection most likely caused by a virus. There is not specific treatment for viral sinusitis other than to help you with the symptoms until the infection runs its course.  You may use an oral decongestant such as Mucinex D or if you have glaucoma or high blood pressure use plain Mucinex. Saline nasal spray help and can safely be used as often as needed for congestion, I have prescribed: Ipratropium Bromide nasal spray 0.03% 2 sprays in eah nostril 2-3 times a day ? ?Some authorities believe that zinc sprays or the use of Echinacea may shorten the course of your symptoms. ? ?Sinus infections are not as easily transmitted as other respiratory infection, however we still recommend that you avoid close contact with loved ones, especially the very young and elderly.  Remember to wash your hands thoroughly throughout the day as this is the number one way to prevent the spread of infection! ? ?It may be beneficial to consider testing for Covid 19 if desired as your symptoms do sound somewhat similar to what we have been seeing with Covid 19.  ? ?Home Care: ?Only take medications as instructed by your medical team. ?Do not take these medications with alcohol. ?A steam or ultrasonic humidifier can help congestion.  You can place a towel over your head and breathe in the steam from hot water coming from a faucet. ?Avoid close contacts especially the very young and the elderly. ?Cover your mouth when you cough or sneeze. ?Always remember to wash your hands. ? ?Get Help Right Away If: ?You develop worsening fever or sinus pain. ?You develop a severe head ache or visual changes. ?Your  symptoms persist after you have completed your treatment plan. ? ?Make sure you ?Understand these instructions. ?Will watch your condition. ?Will get help right away if you are not doing well or get worse. ? ? ?Thank you for choosing an e-visit. ? ?Your e-visit answers were reviewed by a board certified advanced clinical practitioner to complete your personal care plan. Depending upon the condition, your plan could have included both over the counter or prescription medications. ? ?Please review your pharmacy choice. Make sure the pharmacy is open so you can pick up prescription now. If there is a problem, you may contact your provider through [employer] and have the prescription routed to another pharmacy.  Your safety is important to [place]. If you have drug allergies check your prescription carefully.  ? ?For the next 24 hours you can use MyChart to ask questions about today's visit, request a non-urgent call back, or ask for a work or school excuse. ?You will get an email in the next two days asking about your experience. I hope that your e-visit has been valuable and will speed your recovery. ? ? ?I provided 5 minutes of non face-to-face time during this encounter for chart review and documentation.  ? ?

## 2021-06-21 ENCOUNTER — Encounter: Payer: Self-pay | Admitting: Family

## 2021-06-23 ENCOUNTER — Other Ambulatory Visit: Payer: Self-pay

## 2021-06-23 DIAGNOSIS — F32A Depression, unspecified: Secondary | ICD-10-CM

## 2021-06-23 MED ORDER — SERTRALINE HCL 100 MG PO TABS: 100.0000 mg | ORAL_TABLET | Freq: Every day | ORAL | 1 refills | Status: DC

## 2021-06-24 ENCOUNTER — Encounter: Payer: Self-pay | Admitting: Neurology

## 2021-06-25 ENCOUNTER — Telehealth: Payer: Self-pay

## 2021-06-25 NOTE — Telephone Encounter (Signed)
LMTCB to see if patient would like to see GYN or if she is still seeing [facility]. ?

## 2021-06-26 NOTE — Telephone Encounter (Signed)
Patient called office back. Note was read, but patient did not understand. Please call her back. ?

## 2021-06-27 ENCOUNTER — Encounter: Payer: Self-pay | Admitting: Family

## 2021-06-27 ENCOUNTER — Other Ambulatory Visit: Payer: Self-pay | Admitting: Family

## 2021-06-27 ENCOUNTER — Ambulatory Visit (INDEPENDENT_AMBULATORY_CARE_PROVIDER_SITE_OTHER): Payer: Managed Care, Other (non HMO) | Admitting: Family

## 2021-06-27 VITALS — BP 126/82 | HR 82 | Temp 98.2°F | Ht 67.0 in | Wt 208.2 lb

## 2021-06-27 DIAGNOSIS — R198 Other specified symptoms and signs involving the digestive system and abdomen: Secondary | ICD-10-CM

## 2021-06-27 DIAGNOSIS — R519 Headache, unspecified: Secondary | ICD-10-CM

## 2021-06-27 DIAGNOSIS — F419 Anxiety disorder, unspecified: Secondary | ICD-10-CM

## 2021-06-27 DIAGNOSIS — M542 Cervicalgia: Secondary | ICD-10-CM

## 2021-06-27 DIAGNOSIS — K219 Gastro-esophageal reflux disease without esophagitis: Secondary | ICD-10-CM

## 2021-06-27 DIAGNOSIS — F32A Depression, unspecified: Secondary | ICD-10-CM

## 2021-06-27 LAB — TSH: TSH: 2.31 u[IU]/mL (ref 0.35–5.50)

## 2021-06-27 LAB — B12 AND FOLATE PANEL
Folate: 17.4 ng/mL (ref >5.9)
Vitamin B-12: 233 pg/mL (ref 211–911)

## 2021-06-27 MED ORDER — NORTRIPTYLINE HCL 10 MG PO CAPS: 10.0000 mg | ORAL_CAPSULE | Freq: Every day | ORAL | 2 refills | Status: DC

## 2021-06-27 MED ORDER — TIZANIDINE HCL 2 MG PO CAPS: 2.0000 mg | ORAL_CAPSULE | Freq: Every evening | ORAL | 1 refills | Status: DC | PRN

## 2021-06-27 MED ORDER — FAMOTIDINE 20 MG PO TABS: 20.0000 mg | ORAL_TABLET | Freq: Every day | ORAL | 1 refills | Status: DC

## 2021-06-27 NOTE — Assessment & Plan Note (Signed)
Suboptimal control.  No improvement with increasing Protonix to twice daily dosing.  Patient will continue Protonix 20 mg every morning.  I have sent in Pepcid AC qhs.  She will let me know how she is doing ?

## 2021-06-27 NOTE — Assessment & Plan Note (Signed)
Depression controlled.  Some breakthrough anxiety symptoms.  We will continue Zoloft 100mg  at this time.  We did discuss that chronic headaches and poor control likely contributing to anxiety and depression.  May consider propranolol for anxiety and headache if nortriptyline is ineffective ?

## 2021-06-27 NOTE — Assessment & Plan Note (Addendum)
Uncontrolled.  We agreed to start nortriptyline 10 mg at bedtime as to help with chronic posterior neck pain, and possibly irritable bowel syndrome.  She will let me know how she is doing.  Also advised her with tizanidine to take as needed for neck pain is concerned for cervicogenic HA.  Pending MRI cervical spine in the setting of bilateral numbness .  ?she will keep follow-up Dr Everlena Cooper and let me know how she is doing.  I advised  to hold atarax qhs which she has been using for sleep, anxiety as unlikely she will need as we are starting nortriptyline and it is sedating.  We will certainly address this at follow-up ?

## 2021-06-27 NOTE — Progress Notes (Signed)
? ?Subjective:  ? ? Patient ID: Melissa Walton, female    DOB: 02/25/1991, 31 y.o.   MRN: [id number] ? ?CC: Melissa Walton is a 31 y.o. female who presents today for follow up.  ? ?HPI: Complains of daily migraines.  Presentation is unchanged from prior .  ?she feels that posterior neck pain may be contributory as she describes  posterior neck pain and bilateral arm numbness to fingertips. Numbness and pain will travel posteriorly to which frontal side of head.  ?Nsaids doesn't work.  She has taken muscle relaxer remotely in the past. ? ? ? ? ?GERD-Continues to have breakthrough dyspepsia.  compliant Protonix to 20 mg every morning as no improvement with BID dosing. She is not taking she Pepcid AC. ? ?She is to have fluctuating diarrhea and constipation. ?She didn't start Metamucil. She is avoiding lactose ?Appointment is scheduled with Dr. Carlean Purl 08/14/21 ? ? ?She has tried effexor, topamax without relief. She is compliant with magnesium.  ? 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  She declines referral for this ?HISTORY:  ?Past Medical History:  ?Diagnosis Date  ? Anxiety   ? highly anxious  ? Complication of anesthesia   ? Took double anesthesia to keep her asleep, she stated "she is a red head"  ? Concussion   ? high school  ? Family history of adverse reaction to anesthesia   ? Grandfather had post op N&V, grandmother (red head woke up during surgery)  ? GERD (gastroesophageal reflux disease)   ? Headache 09/14/2020  ? constant since july2  ? Migraine   ? causes weakness in arms  ? Motion sickness   ? in back seat of car  ? ?Past Surgical History:  ?Procedure Laterality Date  ? SEPTOPLASTY Bilateral 10/31/2020  ? Procedure: SEPTOPLASTY;  Surgeon: Margaretha Sheffield, MD;  Location: [facility];  Service: ENT;  Laterality: Bilateral;  ? TURBINATE REDUCTION Bilateral 10/31/2020  ? Procedure: TURBINATE REDUCTION;  Surgeon: Margaretha Sheffield, MD;  Location: [facility];  Service: ENT;  Laterality: Bilateral;  ? UPPER GI ENDOSCOPY    ? WISDOM TOOTH EXTRACTION    ? ?Family History  ?Problem Relation Age of Onset  ? Diabetes Mother   ? Hyperlipidemia Mother   ? Hypertension Father   ? Gout Father   ? Heart disease Maternal Grandfather   ? Ovarian cancer Paternal Grandmother   ? ? ?Allergies: Nsaids, Loracarbef, and Penicillins ?Current Outpatient Medications on File Prior to Visit  ?Medication Sig Dispense Refill  ? hydrOXYzine (ATARAX/VISTARIL) 10 MG tablet TAKE 1 TABLET(10 MG) BY MOUTH AT BEDTIME 90 tablet 1  ? ondansetron (ZOFRAN-ODT) 4 MG disintegrating tablet Take 4 mg by mouth every 8 (eight) hours as needed.    ? pantoprazole (PROTONIX) 20 MG tablet TAKE 1 TABLET BY MOUTH 30 MINUTES TO 1 HOUR BEFORE A MEAL TWICE DAILY 90 tablet 1  ? sertraline (ZOLOFT) 100 MG tablet Take 1 tablet (100 mg total) by mouth at bedtime. 90 tablet 1  ? ipratropium (ATROVENT) 0.03 % nasal spray Place 2 sprays into both nostrils every 12 (twelve) hours. (Patient not taking: Reported on 06/27/2021) 30 mL 0  ? SUMAtriptan (IMITREX) 100 MG tablet Take 100 mg by mouth as directed. (Patient not taking: Reported on 06/27/2021)    ? [DISCONTINUED] omeprazole (PRILOSEC OTC) 20 MG tablet Take 1 tablet (20 mg total) by mouth daily. 30 tablet 6  ? ?  No current facility-administered medications on file prior to visit.  ? ? ?Social History  ? ?Tobacco Use  ? Smoking status: Never  ? Smokeless tobacco: Never  ?Substance Use Topics  ? Alcohol use: Yes  ?  Comment: occasionnally  ? Drug use: No  ? ? ?Review of Systems  ?Constitutional:  Negative for chills and fever.  ?Eyes:  Negative for visual disturbance.  ?Respiratory:  Negative for cough.   ?Cardiovascular:  Negative for chest pain and palpitations.  ?Gastrointestinal:  Positive for constipation and diarrhea. Negative for nausea and vomiting.  ?Neurological:  Positive for numbness and headaches. Negative for weakness.  ?    ?Objective:  ?  ?BP 126/82 (BP Location: Left Arm, Patient Position: Sitting, Cuff Size: Normal)   Pulse 82   Temp 98.2 ?F (36.8 ?C) (Oral)   Ht 5\' 7"  (1.702 m)   Wt 208 lb 3.2 oz (94.4 kg)   LMP 06/06/2021 (Approximate)   SpO2 99%   BMI 32.61 kg/m?  ?BP Readings from Last 3 Encounters:  ?06/27/21 126/82  ?05/23/21 128/70  ?04/11/21 113/67  ? ?Wt Readings from Last 3 Encounters:  ?06/27/21 208 lb 3.2 oz (94.4 kg)  ?05/23/21 201 lb 12.8 oz (91.5 kg)  ?04/11/21 202 lb 9.6 oz (91.9 kg)  ? ? ?Physical Exam ?Vitals reviewed.  ?Constitutional:   ?   Appearance: She is well-developed.  ?Eyes:  ?   Conjunctiva/sclera: Conjunctivae normal.  ?Neck:  ? ?   Comments: Bilateral upper extremity strength assessed 5 out of 5 ?Cardiovascular:  ?   Rate and Rhythm: Normal rate and regular rhythm.  ?   Pulses: Normal pulses.  ?   Heart sounds: Normal heart sounds.  ?Pulmonary:  ?   Effort: Pulmonary effort is normal.  ?   Breath sounds: Normal breath sounds. No wheezing, rhonchi or rales.  ?Musculoskeletal:  ?   Cervical back: Normal range of motion. Spinous process tenderness present. No pain with movement or muscular tenderness. Normal range of motion.  ?Skin: ?   General: Skin is warm and dry.  ?Neurological:  ?   Mental Status: She is alert.  ?Psychiatric:     ?   Speech: Speech normal.     ?   Behavior: Behavior normal.     ?   Thought Content: Thought content normal.  ? ? ?   ?Assessment & Plan:  ? ?Problem List Items Addressed This Visit   ? ?  ? Digestive  ? GERD (gastroesophageal reflux disease)  ?  Suboptimal control.  No improvement with increasing Protonix to twice daily dosing.  Patient will continue Protonix 20 mg every morning.  I have sent in Pepcid AC qhs.  She will let me know how she is doing ? ?  ?  ? Relevant Medications  ? famotidine (PEPCID) 20 MG tablet  ?  ? Other  ? Alternating constipation and diarrhea  ?  Unchanged.  Patient did not trial Metamucil and I counseled her that she may consider this in  the future.  For now we will start nortriptyline 10 mg to see if helpful for symptoms.  She has follow-up with GI next month, I will follow. ? ?  ?  ? Anxiety and depression  ?  Depression controlled.  Some breakthrough anxiety symptoms.  We will continue Zoloft 100mg  at this time.  We did discuss that chronic headaches and poor control likely contributing to anxiety and depression.  May consider propranolol for anxiety and headache if  nortriptyline is ineffective ? ?  ?  ? Relevant Medications  ? nortriptyline (PAMELOR) 10 MG capsule  ? Headache  ?  Uncontrolled.  We agreed to start nortriptyline 10 mg at bedtime as to help with chronic posterior neck pain, and possibly irritable bowel syndrome.  She will let me know how she is doing.  Also advised her with tizanidine to take as needed for neck pain is concerned for cervicogenic HA.  Pending MRI cervical spine in the setting of bilateral numbness .  ?she will keep follow-up Dr Tomi Likens and let me know how she is doing.  I advised  to hold atarax qhs which she has been using for sleep, anxiety as unlikely she will need as we are starting nortriptyline and it is sedating.  We will certainly address this at follow-up ? ?  ?  ? Relevant Medications  ? tizanidine (ZANAFLEX) 2 MG capsule  ? nortriptyline (PAMELOR) 10 MG capsule  ? ?Other Visit Diagnoses   ? ? Cervicalgia    -  Primary  ? Relevant Medications  ? tizanidine (ZANAFLEX) 2 MG capsule  ? nortriptyline (PAMELOR) 10 MG capsule  ? Other Relevant Orders  ? MR Cervical Spine Wo Contrast  ? TSH  ? B12 and Folate Panel  ? ?  ? ? ? ?I am having Laritza L. Eads start on tizanidine, nortriptyline, and famotidine. I am also having her maintain her hydrOXYzine, SUMAtriptan, ondansetron, pantoprazole, ipratropium, and sertraline. ? ? ?Meds ordered this encounter  ?Medications  ? tizanidine (ZANAFLEX) 2 MG capsule  ?  Sig: Take 1 capsule (2 mg total) by mouth at bedtime as needed for muscle spasms.  ?  Dispense:  15 capsule   ?  Refill:  1  ?  Order Specific Question:   Supervising Provider  ?  Answer:   Crecencio Mc [[id number]]  ? nortriptyline (PAMELOR) 10 MG capsule  ?  Sig: Take 1 capsule (10 mg total) by mouth at bedtime. For one

## 2021-06-27 NOTE — Patient Instructions (Signed)
For acid reflux, unit continue Protonix 20 mg taken in the morning and also add on Pepcid AC taken in the evening.  Let me know if symptoms improve ? ?For headache, I will start nortriptyline 10 mg at bedtime.  This is sedating.  I would advise not to take atarax  for now.  I have also provided you with a prescription for as needed muscle relaxant, tizanidine.Do not drive or operate heavy machinery while on muscle relaxant. Please do not drink alcohol. Only take this medication as needed for acute muscle spasm at bedtime. This medication make you feel drowsy so be very careful.  Stop taking if become too drowsy or somnolent as this puts you at risk for falls. Please contact our office with any questions.  ? ?I have ordered MRI cervical spine ?Let us know if you dont hear back within a week in regards to an appointment being scheduled.  ? ? ?

## 2021-06-27 NOTE — Assessment & Plan Note (Addendum)
Unchanged.  Patient did not trial Metamucil and I counseled her that she may consider this in the future.  For now we will start nortriptyline 10 mg to see if helpful for symptoms.  She has follow-up with GI next month, I will follow. ?

## 2021-06-27 NOTE — Progress Notes (Signed)
close

## 2021-07-01 ENCOUNTER — Encounter: Payer: Self-pay | Admitting: Family

## 2021-07-04 ENCOUNTER — Other Ambulatory Visit: Payer: Self-pay | Admitting: Family

## 2021-07-04 DIAGNOSIS — E538 Deficiency of other specified B group vitamins: Secondary | ICD-10-CM

## 2021-07-04 MED ORDER — CYANOCOBALAMIN 1000 MCG/ML IJ SOLN: INTRAMUSCULAR | 15 refills | Status: DC

## 2021-07-08 NOTE — Telephone Encounter (Signed)
See mychart correspondence.

## 2021-07-16 ENCOUNTER — Other Ambulatory Visit: Payer: Self-pay | Admitting: Family

## 2021-07-16 DIAGNOSIS — M542 Cervicalgia: Secondary | ICD-10-CM

## 2021-07-21 ENCOUNTER — Telehealth: Payer: Managed Care, Other (non HMO) | Admitting: Physician Assistant

## 2021-07-21 DIAGNOSIS — R3989 Other symptoms and signs involving the genitourinary system: Secondary | ICD-10-CM

## 2021-07-21 MED ORDER — SULFAMETHOXAZOLE-TRIMETHOPRIM 800-160 MG PO TABS: 1.0000 | ORAL_TABLET | Freq: Two times a day (BID) | ORAL | 0 refills | Status: DC

## 2021-07-21 NOTE — Progress Notes (Signed)

## 2021-08-08 ENCOUNTER — Ambulatory Visit (INDEPENDENT_AMBULATORY_CARE_PROVIDER_SITE_OTHER): Payer: Managed Care, Other (non HMO) | Admitting: Family

## 2021-08-08 ENCOUNTER — Encounter: Payer: Self-pay | Admitting: Family

## 2021-08-08 ENCOUNTER — Other Ambulatory Visit: Payer: Self-pay

## 2021-08-08 ENCOUNTER — Telehealth: Payer: Self-pay | Admitting: Family

## 2021-08-08 VITALS — BP 102/64 | HR 83 | Temp 98.0°F | Ht 67.0 in | Wt 213.2 lb

## 2021-08-08 DIAGNOSIS — M542 Cervicalgia: Secondary | ICD-10-CM | POA: Diagnosis not present

## 2021-08-08 DIAGNOSIS — E538 Deficiency of other specified B group vitamins: Secondary | ICD-10-CM

## 2021-08-08 DIAGNOSIS — R519 Headache, unspecified: Secondary | ICD-10-CM | POA: Diagnosis not present

## 2021-08-08 DIAGNOSIS — K219 Gastro-esophageal reflux disease without esophagitis: Secondary | ICD-10-CM

## 2021-08-08 DIAGNOSIS — R591 Generalized enlarged lymph nodes: Secondary | ICD-10-CM | POA: Diagnosis not present

## 2021-08-08 DIAGNOSIS — E663 Overweight: Secondary | ICD-10-CM

## 2021-08-08 MED ORDER — "BD SAFETYGLIDE NEEDLE 25G X 5/8"" MISC": 0 refills | Status: DC

## 2021-08-08 MED ORDER — CYANOCOBALAMIN 1000 MCG/ML IJ SOLN: INTRAMUSCULAR | 1 refills | Status: DC

## 2021-08-08 MED ORDER — NORTRIPTYLINE HCL 25 MG PO CAPS: 25.0000 mg | ORAL_CAPSULE | Freq: Every day | ORAL | 3 refills | Status: DC

## 2021-08-08 MED ORDER — "BD ECLIPSE SYRINGE 25G X 1"" 3 ML MISC": 0 refills | Status: DC

## 2021-08-08 MED ORDER — METFORMIN HCL ER 500 MG PO TB24: 500.0000 mg | ORAL_TABLET | Freq: Every evening | ORAL | 2 refills | Status: DC

## 2021-08-08 NOTE — Progress Notes (Signed)
Subjective:    Patient ID: Melissa Walton, female    DOB: Jul 11, 1990, 31 y.o.   MRN: [id number]  CC: Melissa Walton is a 31 y.o. female who presents today for follow up.   HPI: She is also concerned with weight gain and interested in weight loss medication.     She has tried phentermine in the past No h/o DM.  No personal or family h/o thyroid cancer or MEN.   Mother has DM  She continues to feel right intermittent lump under right ear.  Will enlarge when she has a URI.  Previously had a ultrasound in 2020, which was small normal-appearing lymph node.   HA frequency and severity has improed. She may go a couple of days without a HA each week. HA 'are not as extreme'.   Neck pain is unchanged. She has used tizanidine with relief.   GERD-epigastric pain has resolved. She has not started Pepcid AC nightly.  Compliant Protonix 20 mg daily.   Started Nortriptyline 10 mg for alternating constipation diarrhea, HA.  Provided her with tizanidine for neck pain.   Follow up Dr Tomi Likens 08/13/21 Appointment with Dr Carlean Purl 08/14/21 Depression-compliant with Zoloft 100mg  HISTORY:  Past Medical History:  Diagnosis Date   Anxiety    highly anxious   Complication of anesthesia    Took double anesthesia to keep her asleep, she stated "she is a red head"   Concussion    high school   Family history of adverse reaction to anesthesia    Grandfather had post op N&V, grandmother (red head woke up during surgery)   GERD (gastroesophageal reflux disease)    Headache 09/14/2020   constant since july2   Migraine    causes weakness in arms   Motion sickness    in back seat of car   Past Surgical History:  Procedure Laterality Date   SEPTOPLASTY Bilateral 10/31/2020   Procedure: SEPTOPLASTY;  Surgeon: Margaretha Sheffield, MD;  Location: [place];  Service: ENT;  Laterality: Bilateral;   TURBINATE REDUCTION Bilateral 10/31/2020   Procedure: TURBINATE REDUCTION;  Surgeon: Margaretha Sheffield,  MD;  Location: [place];  Service: ENT;  Laterality: Bilateral;   UPPER GI ENDOSCOPY     WISDOM TOOTH EXTRACTION     Family History  Problem Relation Age of Onset   Diabetes Mother    Hyperlipidemia Mother    Hypertension Father    Gout Father    Diabetes Father    Heart disease Maternal Grandfather    Ovarian cancer Paternal Grandmother    Thyroid cancer Neg Hx     Allergies: Nsaids, Loracarbef, and Penicillins Current Outpatient Medications on File Prior to Visit  Medication Sig Dispense Refill   hydrOXYzine (ATARAX/VISTARIL) 10 MG tablet TAKE 1 TABLET(10 MG) BY MOUTH AT BEDTIME 90 tablet 1   ondansetron (ZOFRAN-ODT) 4 MG disintegrating tablet Take 4 mg by mouth every 8 (eight) hours as needed.     pantoprazole (PROTONIX) 20 MG tablet TAKE 1 TABLET BY MOUTH 30 MINUTES TO 1 HOUR BEFORE A MEAL TWICE DAILY 90 tablet 1   sertraline (ZOLOFT) 100 MG tablet Take 1 tablet (100 mg total) by mouth at bedtime. 90 tablet 1   tiZANidine (ZANAFLEX) 2 MG tablet TAKE 1 CAPSULE(2 MG) BY MOUTH AT BEDTIME AS NEEDED FOR MUSCLE SPASMS 15 tablet 1   SUMAtriptan (IMITREX) 100 MG tablet Take 100 mg by mouth as directed. (Patient not taking: Reported on 08/08/2021)     [DISCONTINUED] omeprazole (  PRILOSEC OTC) 20 MG tablet Take 1 tablet (20 mg total) by mouth daily. 30 tablet 6   No current facility-administered medications on file prior to visit.    Social History   Tobacco Use   Smoking status: Never   Smokeless tobacco: Never  Substance Use Topics   Alcohol use: Yes    Comment: occasionnally   Drug use: No    Review of Systems  Constitutional:  Negative for chills and fever.  Respiratory:  Negative for cough.   Cardiovascular:  Negative for chest pain and palpitations.  Gastrointestinal:  Negative for nausea and vomiting.  Musculoskeletal:  Positive for neck pain.  Neurological:  Positive for headaches.     Objective:    BP 102/64 (BP Location: Left Arm, Patient Position:  Sitting, Cuff Size: Large)   Pulse 83   Temp 98 F (36.7 C) (Rectal)   Ht 5\' 7"  (1.702 m)   Wt 213 lb 3.2 oz (96.7 kg)   SpO2 98%   BMI 33.39 kg/m  BP Readings from Last 3 Encounters:  08/08/21 102/64  06/27/21 126/82  05/23/21 128/70   Wt Readings from Last 3 Encounters:  08/08/21 213 lb 3.2 oz (96.7 kg)  06/27/21 208 lb 3.2 oz (94.4 kg)  05/23/21 201 lb 12.8 oz (91.5 kg)    Physical Exam Vitals reviewed.  Constitutional:      Appearance: She is well-developed.  Eyes:     Conjunctiva/sclera: Conjunctivae normal.  Neck:     Thyroid: No thyroid mass, thyromegaly or thyroid tenderness.  Cardiovascular:     Rate and Rhythm: Normal rate and regular rhythm.     Pulses: Normal pulses.     Heart sounds: Normal heart sounds.  Pulmonary:     Effort: Pulmonary effort is normal.     Breath sounds: Normal breath sounds. No wheezing, rhonchi or rales.  Lymphadenopathy:     Head:     Right side of head: No submental, submandibular, tonsillar, preauricular, posterior auricular or occipital adenopathy.     Left side of head: No submental, submandibular, tonsillar, preauricular, posterior auricular or occipital adenopathy.     Cervical: No cervical adenopathy.  Skin:    General: Skin is warm and dry.  Neurological:     Mental Status: She is alert.  Psychiatric:        Speech: Speech normal.        Behavior: Behavior normal.        Thought Content: Thought content normal.       Assessment & Plan:   Problem List Items Addressed This Visit       Digestive   GERD (gastroesophageal reflux disease)    Chronic, resolved.  Continue Protonix 20 mg every morning.         Immune and Lymphatic   Lymphadenopathy    Unable to palpate on exam today.  Reviewed previous ultrasound.  Ordered follow-up ultrasound to ensure no interval changes       Relevant Orders   US Soft Tissue Head/Neck (NON-THYROID)     Other   B12 deficiency    Patient prefers to do B12 injections at home.   She says her mother is qualified to do this.  I have asked her to make a nurse visit and for her mother to accompany her for education, safety prior to administration.         Headache    Pleased that headache frequency and severity has improved on nortriptyline 10 mg.  We jointly  agreed to increase to 25 mg nightly.  She has upcoming follow-up with neurology.  will follow       Relevant Medications   nortriptyline (PAMELOR) 25 MG capsule   Neck pain    Unchanged on nortriptyline.  She continues use tizanidine as needed.  We will plan to proceed with MRI cervical spine       Overweight (BMI 25.0-29.9) - Primary    Family history of diabetes.  Jointly agreed trial of metformin is appropriate.  She will start metformin 500 mg and titrate.  In the future, we may discuss GLP-1.  She has no personal or family history of thyroid cancer, MEN       Relevant Medications   metFORMIN (GLUCOPHAGE-XR) 500 MG 24 hr tablet   Other Visit Diagnoses     Cervicalgia       Relevant Medications   nortriptyline (PAMELOR) 25 MG capsule        I have discontinued Ahlivia L. Struckman's ipratropium, famotidine, and sulfamethoxazole-trimethoprim. I have also changed her nortriptyline. Additionally, I am having her start on metFORMIN. Lastly, I am having her maintain her hydrOXYzine, SUMAtriptan, ondansetron, pantoprazole, sertraline, and tiZANidine.   Meds ordered this encounter  Medications   metFORMIN (GLUCOPHAGE-XR) 500 MG 24 hr tablet    Sig: Take 1 tablet (500 mg total) by mouth every evening.    Dispense:  90 tablet    Refill:  2    Order Specific Question:   Supervising Provider    Answer:   Deborra Medina L [[id number]]   nortriptyline (PAMELOR) 25 MG capsule    Sig: Take 1 capsule (25 mg total) by mouth at bedtime.    Dispense:  90 capsule    Refill:  3    Order Specific Question:   Supervising Provider    Answer:   Crecencio Mc [[id number]]    Return precautions given.   Risks, benefits,  and alternatives of the medications and treatment plan prescribed today were discussed, and patient expressed understanding.   Education regarding symptom management and diagnosis given to patient on AVS.  Continue to follow with Burnard Hawthorne, FNP for routine health maintenance.   Melissa Walton and I agreed with plan.   Mable Paris, FNP

## 2021-08-08 NOTE — Assessment & Plan Note (Signed)
Unchanged on nortriptyline.  She continues use tizanidine as needed.  We will plan to proceed with MRI cervical spine

## 2021-08-08 NOTE — Telephone Encounter (Signed)
Patient has completed 6 weeks of therapy on nortriptyline which was ineffective for her neck pain.  Please schedule MRI cervical spine

## 2021-08-08 NOTE — Patient Instructions (Addendum)
Call to schedule nurse visit for teaching of B12 injection. Please bring your  mother if she is one who will be giving. I want you to be safe.  Lets trial metformin  Start metformin XR with one 500mg  tablet at night. After one week, you may increase to two tablets at night ( total of 1000mg ) . The third week, you may take take two tablets at night and one tablet in the morning.  The fourth week, you may take two tablets in the morning ( 1000mg  total) and two tablets at night (1000mg  total). This will bring you to a maximum daily dose of 2000mg /day which is maximum dose. Along the way, if you want to increase more slowly, please do as this medication can cause GI discomfort and loose stools which usually get better with time , however some patients find that they can only tolerate a certain dose and cannot increase to maximum dose.   Increase nortriptyline to 25 mg nightly.  Please let me know how you are doing

## 2021-08-08 NOTE — Assessment & Plan Note (Signed)
Patient prefers to do B12 injections at home.  She says her mother is qualified to do this.  I have asked her to make a nurse visit and for her mother to accompany her for education, safety prior to administration.

## 2021-08-08 NOTE — Assessment & Plan Note (Signed)
Family history of diabetes.  Jointly agreed trial of metformin is appropriate.  She will start metformin 500 mg and titrate.  In the future, we may discuss GLP-1.  She has no personal or family history of thyroid cancer, MEN

## 2021-08-08 NOTE — Assessment & Plan Note (Signed)
Unable to palpate on exam today.  Reviewed previous ultrasound.  Ordered follow-up ultrasound to ensure no interval changes

## 2021-08-08 NOTE — Assessment & Plan Note (Signed)
Pleased that headache frequency and severity has improved on nortriptyline 10 mg.  We jointly agreed to increase to 25 mg nightly.  She has upcoming follow-up with neurology.  will follow

## 2021-08-08 NOTE — Telephone Encounter (Signed)
Lft pt vm to call ofc . thanks 

## 2021-08-08 NOTE — Assessment & Plan Note (Signed)
Chronic, resolved.  Continue Protonix 20 mg every morning.

## 2021-08-11 ENCOUNTER — Other Ambulatory Visit: Payer: Self-pay | Admitting: Family

## 2021-08-11 DIAGNOSIS — M542 Cervicalgia: Secondary | ICD-10-CM

## 2021-08-12 ENCOUNTER — Other Ambulatory Visit: Payer: Self-pay | Admitting: Family

## 2021-08-12 ENCOUNTER — Encounter: Payer: Self-pay | Admitting: Family

## 2021-08-12 DIAGNOSIS — E663 Overweight: Secondary | ICD-10-CM

## 2021-08-12 MED ORDER — OZEMPIC (0.25 OR 0.5 MG/DOSE) 2 MG/1.5ML ~~LOC~~ SOPN: 0.2500 mg | PEN_INJECTOR | SUBCUTANEOUS | 3 refills | Status: DC

## 2021-08-12 NOTE — Progress Notes (Unsigned)
NEUROLOGY FOLLOW UP OFFICE NOTE  KALEESI Walton [id number]  Assessment/Plan:   1  Migraine without aura, with status migrainosus, intractable 2  Dizziness/lightheadedness   Migraine prevention:  She will contact her insurance to find out if Ajovy or Emgality are covered and will let us know.  I would retry Ajovy since she had some mild improvement and hasn't really given it enough time (I recommend 4 month trial) Migraine rescue:  I will have her try Bernita Raisin as it will likely be more tolerable than sumatriptan.  Otherwise, she still has sumatriptan 100mg  with Zofran ODT 4mg  Limit use of pain relievers to no more than 2 days out of week to prevent risk of rebound or medication-overuse headache. Keep headache diary Follow up 6 months.       Subjective:  Melissa Walton is a 31 year old right-handed female who follows up for migraines.   UPDATE: MRI brain with and without contrast and MRA of head on 04/16/2021 were normal.  Insurance would not approve Ajovy.  Started on nortriptyline.  ***   Frequency of abortive medication: 3 to 4 times  week Current NSAIDS/analgesics:  none Current triptans:  sumatriptan 100mg  (causes panic attacks) Current ergotamine:  none Current anti-emetic:  Zofran Current muscle relaxants:  tizanidine 2mg  QHS PRN Current Antihypertensive medications:  none Current Antidepressant medications:  Nortriptyline  25mg  at bedtime, Sertraline 100mg  QD Current Anticonvulsant medications:  none Current anti-CGRP:  Aimovig Current Vitamins/Herbal/Supplements:  magnesium citrate 400mg  QD Current Antihistamines/Decongestants:  hydroxyzine Other therapy:  none Hormone/birth control:  none   Caffeine:  no coffee.  Soda every now and then Diet:  Does not drink enough water.  Skips meals Exercise:  no Depression:  controlled; Anxiety:  cpntrolled Other pain:  hip pain Sleep hygiene:  wakes up during the night   HISTORY: Migraines started 2018 and gradually  got worse.  They are severe bifrontal/retroorbital/occipital pounding pain associated with nausea, photophobia, phonophobia, dizziness but no visual disturbance, vomiting, autonomic symptoms, numbness and weakness.  They would last 3-4 days with only one day of headache-freedom until next migraine.  She was started on topiramate a few months ago.  Headaches significantly improved - they were less severe, would last less than a day and occur once a week.  In June, she had a an intractable migraine lasting a week.  She had a second intractable migraine beginning July 2.  She received a Toradol shot at her PCP's office which was ineffective.  Maxalt helped ease it but did not break it.  She was seen in the ED on 10/02/2020 where she received a migraine cocktail followed by Haldol.  Headache broke.  She now has a lingering headache.  MRI of brain with and without contrast on 08/08/2020 was normal.   In January 2023, she reports feeling constant lightheadedness.  No numbness, visual disturbance, weakness or worsening headache.  Went to the ED.  Felt palpitations but ER said heart rate was fine.     Past NSAIDS/analgesics:  Excedrin Migraine Past abortive triptans:  none Past abortive ergotamine:  none Past muscle relaxants:  Flexeril Past anti-emetic:  none Past antihypertensive medications:  none Past antidepressant medications:  none Past anticonvulsant medications:  topiramate (side effects) Past anti-CGRP:  Aimovig Past vitamins/Herbal/Supplements:  none Past antihistamines/decongestants:  Zyrtec, Flonase Other past therapies:  none     Family history of headache:  brother  PAST MEDICAL HISTORY: Past Medical History:  Diagnosis Date   Anxiety  highly anxious   Complication of anesthesia    Took double anesthesia to keep her asleep, she stated "she is a red head"   Concussion    high school   Family history of adverse reaction to anesthesia    Grandfather had post op N&V, grandmother  (red head woke up during surgery)   GERD (gastroesophageal reflux disease)    Headache 09/14/2020   constant since july2   Migraine    causes weakness in arms   Motion sickness    in back seat of car    MEDICATIONS: Current Outpatient Medications on File Prior to Visit  Medication Sig Dispense Refill   cyanocobalamin (,VITAMIN B-12,) 1000 MCG/ML injection 1000 mcg (1 mL) intramuscular injection in the thigh ( vastus lateralis) once per week for four weeks, followed by 1000 mcg IM injection once per month. 10 mL 1   hydrOXYzine (ATARAX/VISTARIL) 10 MG tablet TAKE 1 TABLET(10 MG) BY MOUTH AT BEDTIME 90 tablet 1   metFORMIN (GLUCOPHAGE-XR) 500 MG 24 hr tablet Take 1 tablet (500 mg total) by mouth every evening. 90 tablet 2   NEEDLE, DISP, 25 G (BD SAFETYGLIDE NEEDLE) 25G X 5/8" MISC Used to give B-12 injections. 50 each 0   nortriptyline (PAMELOR) 25 MG capsule Take 1 capsule (25 mg total) by mouth at bedtime. 90 capsule 3   ondansetron (ZOFRAN-ODT) 4 MG disintegrating tablet Take 4 mg by mouth every 8 (eight) hours as needed.     pantoprazole (PROTONIX) 20 MG tablet TAKE 1 TABLET BY MOUTH 30 MINUTES TO 1 HOUR BEFORE A MEAL TWICE DAILY 90 tablet 1   sertraline (ZOLOFT) 100 MG tablet Take 1 tablet (100 mg total) by mouth at bedtime. 90 tablet 1   SUMAtriptan (IMITREX) 100 MG tablet Take 100 mg by mouth as directed. (Patient not taking: Reported on 08/08/2021)     SYRINGE-NEEDLE, DISP, 3 ML (BD ECLIPSE SYRINGE) 25G X 1" 3 ML MISC Used to give B-12 injections. 50 each 0   tiZANidine (ZANAFLEX) 2 MG tablet TAKE 1 CAPSULE(2 MG) BY MOUTH AT BEDTIME AS NEEDED FOR MUSCLE SPASMS 15 tablet 1   [DISCONTINUED] omeprazole (PRILOSEC OTC) 20 MG tablet Take 1 tablet (20 mg total) by mouth daily. 30 tablet 6   No current facility-administered medications on file prior to visit.    ALLERGIES: Allergies  Allergen Reactions   Nsaids     GI Bleed with aleve   Loracarbef Rash   Penicillins Rash    FAMILY  HISTORY: Family History  Problem Relation Age of Onset   Diabetes Mother    Hyperlipidemia Mother    Hypertension Father    Gout Father    Diabetes Father    Heart disease Maternal Grandfather    Ovarian cancer Paternal Grandmother    Thyroid cancer Neg Hx       Objective:  *** General: No acute distress.  Patient appears ***-groomed.   Head:  Normocephalic/atraumatic Eyes:  Fundi examined but not visualized Neck: supple, no paraspinal tenderness, full range of motion Heart:  Regular rate and rhythm Lungs:  Clear to auscultation bilaterally Back: No paraspinal tenderness Neurological Exam: alert and oriented to person, place, and time.  Speech fluent and not dysarthric, language intact.  CN II-XII intact. Bulk and tone normal, muscle strength 5/5 throughout.  Sensation to light touch intact.  Deep tendon reflexes 2+ throughout, toes downgoing.  Finger to nose testing intact.  Gait normal, Romberg negative.   Shon Millet, DO  CC: ***

## 2021-08-13 ENCOUNTER — Ambulatory Visit (INDEPENDENT_AMBULATORY_CARE_PROVIDER_SITE_OTHER): Payer: Managed Care, Other (non HMO) | Admitting: Neurology

## 2021-08-13 ENCOUNTER — Encounter: Payer: Self-pay | Admitting: Neurology

## 2021-08-13 ENCOUNTER — Other Ambulatory Visit: Payer: Self-pay | Admitting: Family

## 2021-08-13 VITALS — BP 120/84 | HR 88 | Ht 67.0 in | Wt 215.6 lb

## 2021-08-13 DIAGNOSIS — R42 Dizziness and giddiness: Secondary | ICD-10-CM | POA: Diagnosis not present

## 2021-08-13 DIAGNOSIS — G43011 Migraine without aura, intractable, with status migrainosus: Secondary | ICD-10-CM

## 2021-08-13 DIAGNOSIS — K219 Gastro-esophageal reflux disease without esophagitis: Secondary | ICD-10-CM

## 2021-08-13 MED ORDER — RIZATRIPTAN BENZOATE 10 MG PO TABS
10.0000 mg | ORAL_TABLET | ORAL | 5 refills | Status: DC | PRN
Start: 2021-08-13 — End: 2023-04-22

## 2021-08-13 MED ORDER — NORTRIPTYLINE HCL 50 MG PO CAPS: 50.0000 mg | ORAL_CAPSULE | Freq: Every day | ORAL | 5 refills | Status: DC

## 2021-08-13 NOTE — Patient Instructions (Signed)
Increase nortriptyline to 50mg  at bedtime.  If no improvement in 4 weeks, contact me and we can switch back to topiramate Take rizatriptan at earliest onset of migraine.  May repeat after 2 hours.  Maximum 2 tablets in 24 hours.  Ondansetron for nausea Limit use of pain relievers to no more than 2 days out of week to prevent risk of rebound or medication-overuse headache. Keep headache diary Follow up 4 months.

## 2021-08-14 ENCOUNTER — Other Ambulatory Visit (INDEPENDENT_AMBULATORY_CARE_PROVIDER_SITE_OTHER): Payer: Managed Care, Other (non HMO)

## 2021-08-14 ENCOUNTER — Ambulatory Visit (INDEPENDENT_AMBULATORY_CARE_PROVIDER_SITE_OTHER): Payer: Managed Care, Other (non HMO) | Admitting: Internal Medicine

## 2021-08-14 ENCOUNTER — Encounter: Payer: Self-pay | Admitting: Internal Medicine

## 2021-08-14 VITALS — BP 130/86 | HR 81 | Ht 67.0 in | Wt 215.0 lb

## 2021-08-14 DIAGNOSIS — K219 Gastro-esophageal reflux disease without esophagitis: Secondary | ICD-10-CM | POA: Diagnosis not present

## 2021-08-14 DIAGNOSIS — D509 Iron deficiency anemia, unspecified: Secondary | ICD-10-CM

## 2021-08-14 DIAGNOSIS — K31A0 Gastric intestinal metaplasia, unspecified: Secondary | ICD-10-CM | POA: Diagnosis not present

## 2021-08-14 DIAGNOSIS — K582 Mixed irritable bowel syndrome: Secondary | ICD-10-CM

## 2021-08-14 DIAGNOSIS — E538 Deficiency of other specified B group vitamins: Secondary | ICD-10-CM

## 2021-08-14 LAB — CBC WITH DIFFERENTIAL/PLATELET
Basophils Absolute: 0.1 10*3/uL (ref 0.0–0.1)
Basophils Relative: 1.3 % (ref 0.0–3.0)
Eosinophils Absolute: 0.1 10*3/uL (ref 0.0–0.7)
Eosinophils Relative: 2.2 % (ref 0.0–5.0)
HCT: 36.5 % (ref 36.0–46.0)
Hemoglobin: 12.1 g/dL (ref 12.0–15.0)
Lymphocytes Relative: 27.9 % (ref 12.0–46.0)
Lymphs Abs: 1.4 10*3/uL (ref 0.7–4.0)
MCHC: 33.1 g/dL (ref 30.0–36.0)
MCV: 91.8 fl (ref 78.0–100.0)
Monocytes Absolute: 0.5 10*3/uL (ref 0.1–1.0)
Monocytes Relative: 10 % (ref 3.0–12.0)
Neutro Abs: 2.9 10*3/uL (ref 1.4–7.7)
Neutrophils Relative %: 58.6 % (ref 43.0–77.0)
Platelets: 270 10*3/uL (ref 150.0–400.0)
RBC: 3.98 Mil/uL (ref 3.87–5.11)
RDW: 12.7 % (ref 11.5–15.5)
WBC: 4.9 10*3/uL (ref 4.0–10.5)

## 2021-08-14 LAB — IBC + FERRITIN
Ferritin: 8 ng/mL — ABNORMAL LOW (ref 10.0–291.0)
Iron: 89 ug/dL (ref 42–145)
Saturation Ratios: 19.7 % — ABNORMAL LOW (ref 20.0–50.0)
TIBC: 452.2 ug/dL — ABNORMAL HIGH (ref 250.0–450.0)
Transferrin: 323 mg/dL (ref 212.0–360.0)

## 2021-08-14 NOTE — Telephone Encounter (Signed)
LMTCB to office about  PA for Ozempic

## 2021-08-14 NOTE — Patient Instructions (Signed)
You have been scheduled for an endoscopy. Please follow written instructions given to you at your visit today. If you use inhalers (even only as needed), please bring them with you on the day of your procedure.  Your provider has requested that you go to the basement level for lab work before leaving today. Press "B" on the elevator. The lab is located at the first door on the left as you exit the elevator.  Due to recent changes in healthcare laws, you may see the results of your imaging and laboratory studies on MyChart before your provider has had a chance to review them.  We understand that in some cases there may be results that are confusing or concerning to you. Not all laboratory results come back in the same time frame and the provider may be waiting for multiple results in order to interpret others.  Please give us 48 hours in order for your provider to thoroughly review all the results before contacting the office for clarification of your results.   I appreciate the opportunity to care for you. Carl Gessner, MD, FACG 

## 2021-08-14 NOTE — Progress Notes (Deleted)
Melissa Walton 30 y.o. October 20, 1990 [id number]  Assessment & Plan:      Subjective:   Chief Complaint:  HPI  Allergies  Allergen Reactions   Nsaids     GI Bleed with aleve   Loracarbef Rash   Penicillins Rash   Current Meds  Medication Sig   cyanocobalamin (,VITAMIN B-12,) 1000 MCG/ML injection 1000 mcg (1 mL) intramuscular injection in the thigh ( vastus lateralis) once per week for four weeks, followed by 1000 mcg IM injection once per month.   hydrOXYzine (ATARAX/VISTARIL) 10 MG tablet TAKE 1 TABLET(10 MG) BY MOUTH AT BEDTIME   NEEDLE, DISP, 25 G (BD SAFETYGLIDE NEEDLE) 25G X 5/8" MISC Used to give B-12 injections.   nortriptyline (PAMELOR) 50 MG capsule Take 1 capsule (50 mg total) by mouth at bedtime.   ondansetron (ZOFRAN-ODT) 4 MG disintegrating tablet Take 4 mg by mouth every 8 (eight) hours as needed.   pantoprazole (PROTONIX) 20 MG tablet TAKE 1 TABLET BY MOUTH TWICE DAILY 30 MINUTES TO 1 HOUR BEFORE A MEAL   rizatriptan (MAXALT) 10 MG tablet Take 1 tablet (10 mg total) by mouth as needed for migraine (May repeat after 2 hours.  Maximum 2 tablets in 24 hours.). May repeat in 2 hours if needed   Semaglutide,0.25 or 0.5MG /DOS, (OZEMPIC, 0.25 OR 0.5 MG/DOSE,) 2 MG/1.5ML SOPN Inject 0.25 mg into the skin once a week. After 4 weeks, increase to 0.5mg  [place] qwk.   sertraline (ZOLOFT) 100 MG tablet Take 1 tablet (100 mg total) by mouth at bedtime.   SUMAtriptan (IMITREX) 100 MG tablet Take 100 mg by mouth as directed.   SYRINGE-NEEDLE, DISP, 3 ML (BD ECLIPSE SYRINGE) 25G X 1" 3 ML MISC Used to give B-12 injections.   tiZANidine (ZANAFLEX) 2 MG tablet TAKE 1 TABLET(2 MG) BY MOUTH AT BEDTIME AS NEEDED FOR MUSCLE SPASMS   Past Medical History:  Diagnosis Date   Anxiety    highly anxious   Chronic headaches    Complication of anesthesia    Took double anesthesia to keep her asleep, she stated "she is a red head"   Concussion    high school   Depression    Family  history of adverse reaction to anesthesia    Grandfather had post op N&V, grandmother (red head woke up during surgery)   GERD (gastroesophageal reflux disease)    Headache 09/14/2020   constant since july2   Migraine    causes weakness in arms   Motion sickness    in back seat of car   Obesity    Past Surgical History:  Procedure Laterality Date   SEPTOPLASTY Bilateral 10/31/2020   Procedure: SEPTOPLASTY;  Surgeon: Vernie Murders, MD;  Location: [facility];  Service: ENT;  Laterality: Bilateral;   TURBINATE REDUCTION Bilateral 10/31/2020   Procedure: TURBINATE REDUCTION;  Surgeon: Vernie Murders, MD;  Location: [facility];  Service: ENT;  Laterality: Bilateral;   UPPER GI ENDOSCOPY     WISDOM TOOTH EXTRACTION     Social History   Social History Narrative   Lives with parents in [place]. Dog. 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   Works at [employer], social work.    family history includes Colon cancer in her cousin and paternal uncle; Diabetes in her father and mother; Gout in her father; Heart disease in her maternal grandfather; Hyperlipidemia in her mother; Hypertension in her brother and father; Ovarian cancer in her paternal grandmother.   Review of  Systems   Objective:   Physical Exam

## 2021-08-14 NOTE — Progress Notes (Signed)
Melissa Walton 31 y.o. [birth year] [id number]  Assessment & Plan:   Encounter Diagnoses  Name Primary?   Gastric intestinal metaplasia Yes   Irritable bowel syndrome with both constipation and diarrhea    Gastroesophageal reflux disease without esophagitis    B12 deficiency    Iron deficiency anemia, unspecified iron deficiency anemia type     Schedule for EGD and mapping of the gastric mucosa in the setting of gastric intestinal metaplasia.  I explained to her that this is an expert recommendation that I think is out of an abundance of caution.  Where we go with these findings could include referral to a tertiary center for monitoring perhaps.  The absolute risk of gastric cancer in this setting is still very low.  I do think she needs at a minimum, some reassurance by taking this step.  Gastric mapping plan as follows:  biopsies are obtained from a minimum of five nontargeted biopsy sites (the lesser and greater curvatures of both the antrum and corpus, and the incisura angularis), with one or two biopsies ("bites") per site. Specimens should be placed in a minimum of two bottles, for the corpus and antrum with incisura, respectively. In addition, targeted biopsies should be obtained from irregular areas of the mucosa to rule out dysplasia and early gastric cancer. Pathology reports should include comments on the GIM subtypes, which are routine with H&E staining.     Additional lab follow-up of her iron deficiency anemia as below.  Ferritin was low but saturation was 51% so I was confused by that and we will recheck.  I do suspect menorrhagia is because of iron deficiency.  She has not been instructed to take iron and may need that pending these reviews. Orders Placed This Encounter  Procedures   CBC with Differential/Platelet   IBC + Ferritin   Ambulatory referral to Gastroenterology   Note she is waiting for approval to take Ozempic but has not yet started it.  Interesting  that she is B12 deficient as well, EGD was reported as normal gastric mucosa but 1 wonders about some chronic gastritis issues causing this, as seen on the biopsies.  The risks and benefits as well as alternatives of endoscopic procedure(s) have been discussed and reviewed. All questions answered. The patient agrees to proceed.  CC: [name], FNP  Subjective:   Chief Complaint: Second opinion regarding gastric intestinal metaplasia  HPI  31 year old white woman here for second opinion regarding findings of intestinal metaplasia on gastric biopsies in February 2023.  She had seen Dr. Norma Fredrickson and his team (saw the nurse practitioner first) regarding rectal bleeding and heartburn issues not responding to treatment.  EGD and colonoscopy were performed on 04/24/2021.  EGD normal-appearing esophagus normal-appearing stomach, biopsies taken for H. pylori testing and normal duodenum also biopsied.  Pathology reports chronic gastritis positive for complete intestinal metaplasia (7% of mucosa) negative for H. pylori.  Duodenal biopsies were normal.  Colonoscopy same day, normal terminal ileum, erythematous mucosa in the descending colon and internal hemorrhoids.  Pathology focal acute hemorrhage and no significant histopathologic changes on the colon biopsies.  She reports that she was told to check her family history and if positive for stomach cancer they might consider a closer follow-up endoscopy, she says she has found distant relatives with a history of stomach and colon cancer but was recommended to repeat a routine EGD because of the gastric intestinal metaplasia in 5 years.  She is here for an opinion regarding  that recommendation and further care.  Despite taking pantoprazole 20 mg 2 tablets (40 mg) every morning she still has some heartburn.  She used to take it twice daily with a 20 mg dose at night but she felt like that intensified heartburn.  She was having a lot of rectal bleeding  prior to the colonoscopy but has not had any in some time.  Still complains of "a lot of gas".  No dysphagia.  Has very heavy periods.  Had iron studies taken and was not given the recommendation to take any iron supplementation.  See below.  Also has a history of B12 deficiency.  Level was 233 in April.  188 10 months ago.  She had transiently been treated with B12 injections and these are being restarted.    04/08/2021 ferritin 12 (it was 16 in 2017) Iron 208 TIBC 405.9 transferrin 289.9 with a percent saturation of 51% Allergies  Allergen Reactions   Nsaids     GI Bleed with aleve   Loracarbef Rash   Penicillins Rash   Current Meds  Medication Sig   cyanocobalamin (,VITAMIN B-12,) 1000 MCG/ML injection 1000 mcg (1 mL) intramuscular injection in the thigh ( vastus lateralis) once per week for four weeks, followed by 1000 mcg IM injection once per month.   hydrOXYzine (ATARAX/VISTARIL) 10 MG tablet TAKE 1 TABLET(10 MG) BY MOUTH AT BEDTIME   NEEDLE, DISP, 25 G (BD SAFETYGLIDE NEEDLE) 25G X 5/8" MISC Used to give B-12 injections.   nortriptyline (PAMELOR) 50 MG capsule Take 1 capsule (50 mg total) by mouth at bedtime.   ondansetron (ZOFRAN-ODT) 4 MG disintegrating tablet Take 4 mg by mouth every 8 (eight) hours as needed.   pantoprazole (PROTONIX) 20 MG tablet TAKE 1 TABLET BY MOUTH TWICE DAILY 30 MINUTES TO 1 HOUR BEFORE A MEAL   rizatriptan (MAXALT) 10 MG tablet Take 1 tablet (10 mg total) by mouth as needed for migraine (May repeat after 2 hours.  Maximum 2 tablets in 24 hours.). May repeat in 2 hours if needed   Semaglutide,0.25 or 0.5MG /DOS, (OZEMPIC, 0.25 OR 0.5 MG/DOSE,) 2 MG/1.5ML SOPN Inject 0.25 mg into the skin once a week. After 4 weeks, increase to 0.5mg  Rawson qwk.   sertraline (ZOLOFT) 100 MG tablet Take 1 tablet (100 mg total) by mouth at bedtime.   SUMAtriptan (IMITREX) 100 MG tablet Take 100 mg by mouth as directed.   SYRINGE-NEEDLE, DISP, 3 ML (BD ECLIPSE SYRINGE) 25G X 1" 3  ML MISC Used to give B-12 injections.   tiZANidine (ZANAFLEX) 2 MG tablet TAKE 1 TABLET(2 MG) BY MOUTH AT BEDTIME AS NEEDED FOR MUSCLE SPASMS   Past Medical History:  Diagnosis Date   Anxiety    highly anxious   B12 deficiency    Chronic headaches    Complication of anesthesia    Took double anesthesia to keep her asleep, she stated "she is a red head"   Concussion    high school   Depression    Family history of adverse reaction to anesthesia    Grandfather had post op N&V, grandmother (red head woke up during surgery)   Gastric intestinal metaplasia    GERD (gastroesophageal reflux disease)    Iron deficiency anemia    Migraine    causes weakness in arms   Motion sickness    in back seat of car   Obesity    Past Surgical History:  Procedure Laterality Date   COLONOSCOPY  04/24/2021   SEPTOPLASTY Bilateral  10/31/2020   Procedure: SEPTOPLASTY;  Surgeon: Vernie Murders, MD;  [facility];  Service: ENT;  Laterality: Bilateral;   TURBINATE REDUCTION Bilateral 10/31/2020   Procedure: TURBINATE REDUCTION;  Surgeon: Vernie Murders, MD;  Location: [facility];  Service: ENT;  Laterality: Bilateral;   UPPER GI ENDOSCOPY  04/24/2021   1 previously as well   WISDOM TOOTH EXTRACTION     Social History   Social History Narrative   Lives with parents in [place]. Dog. 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   Works at [employer], social work.    No alcohol no caffeine never smoker no drug use   family history includes Colon cancer in her cousin and paternal uncle; Diabetes in her father and mother; Gout in her father; Heart disease in her maternal grandfather; Hyperlipidemia in her mother; Hypertension in her brother and father; Ovarian cancer in her paternal grandmother.   Review of Systems As per HPI also positive for intermittent headaches dysuria night sweats anxiety otherwise negative  Objective:   Physical Exam @BP  130/86    Pulse 81   Ht 5\' 7"  (1.702 m)   Wt 215 lb (97.5 kg)   LMP 07/14/2021   BMI 33.67 kg/m @  General:  Well-developed, well-nourished and in no acute distress Eyes:  anicteric. Lungs: Clear to auscultation bilaterally. Heart:  S1S2, no rubs, murmurs, gallops. Abdomen:  soft, non-tender, no hepatosplenomegaly, hernia, or mass and BS+.  Skin   pale Neuro:  A&O x 3.  Psych:  appropriate mood and  Affect.   Data Reviewed: See HPI

## 2021-08-15 NOTE — Telephone Encounter (Signed)
Patient returned office phone call. Note was read about her Prior Auth.

## 2021-08-18 ENCOUNTER — Encounter: Payer: Self-pay | Admitting: Family

## 2021-08-19 ENCOUNTER — Other Ambulatory Visit: Payer: Self-pay

## 2021-08-19 ENCOUNTER — Ambulatory Visit (INDEPENDENT_AMBULATORY_CARE_PROVIDER_SITE_OTHER): Payer: Managed Care, Other (non HMO) | Admitting: *Deleted

## 2021-08-19 DIAGNOSIS — E538 Deficiency of other specified B group vitamins: Secondary | ICD-10-CM

## 2021-08-19 MED ORDER — "BD SAFETYGLIDE NEEDLE 25G X 5/8"" MISC": 0 refills | Status: DC

## 2021-08-19 MED ORDER — CYANOCOBALAMIN 1000 MCG/ML IJ SOLN
1000.0000 ug | Freq: Once | INTRAMUSCULAR | Status: AC
  Administered 2021-08-19: 1000 ug via INTRAMUSCULAR

## 2021-08-19 MED ORDER — "BD ECLIPSE SYRINGE 25G X 1"" 3 ML MISC": 0 refills | Status: DC

## 2021-08-19 NOTE — Progress Notes (Signed)
Patient presented for B 12 injection to right deltoid, patient voiced no concerns nor showed any signs of distress during injection. 

## 2021-08-24 ENCOUNTER — Encounter: Payer: Self-pay | Admitting: Neurology

## 2021-08-25 ENCOUNTER — Other Ambulatory Visit: Payer: Self-pay

## 2021-08-25 MED ORDER — TOPIRAMATE 25 MG PO TABS: 25.0000 mg | ORAL_TABLET | Freq: Every day | ORAL | 0 refills | Status: DC

## 2021-08-25 NOTE — Progress Notes (Signed)
Per Dr.Jaffe,  Have her discontinue nortriptyline.  Please send prescription for topiramate 25mg  at bedtime.  We can increase dose in 4 weeks if needed  Topirmate 25 mg at bedtime sent to the [facility].

## 2021-08-26 ENCOUNTER — Other Ambulatory Visit: Payer: Self-pay | Admitting: Neurology

## 2021-08-28 ENCOUNTER — Ambulatory Visit
Admission: RE | Admit: 2021-08-28 | Discharge: 2021-08-28 | Disposition: A | Discharge status: Home / Self Care | Payer: Managed Care, Other (non HMO) | Source: Ambulatory Visit | Attending: Family | Admitting: Family

## 2021-08-28 DIAGNOSIS — M542 Cervicalgia: Secondary | ICD-10-CM | POA: Insufficient documentation

## 2021-08-28 IMAGING — MR MR CERVICAL SPINE W/O CM
5 series · 39 of 48 positions shown · non-contrast
Comparison: Brain MRI [DATE].

CLINICAL DATA: Provided history: Cervicalgia.  Neck pain, chronic.

EXAM:
MRI CERVICAL SPINE WITHOUT CONTRAST
TECHNIQUE: Multiplanar, multisequence MR imaging of the cervical spine was
performed. No intravenous contrast was administered.

[Series 5: T2 · sagittal · 3.0mm · 0.62mm/px · 6 of 15 slices shown (1 of 2)]
[im 1/15]
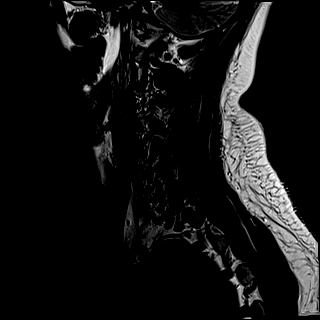
[im 3/15]
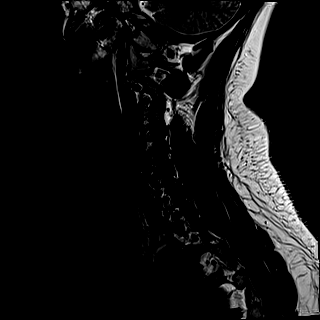
[im 6/15]
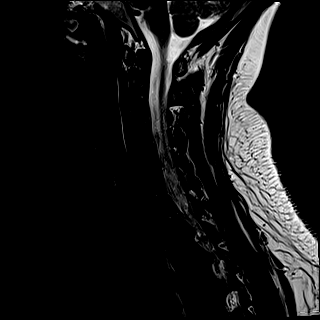
[im 9/15]
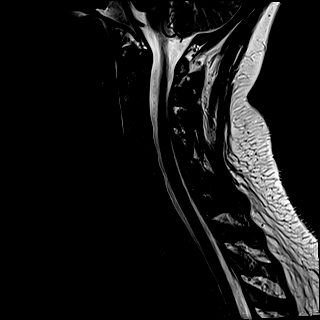
[im 12/15]
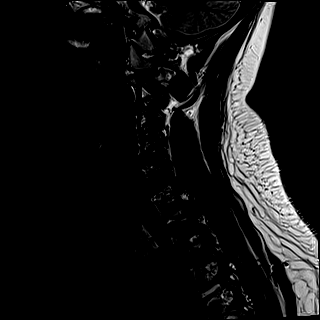
[im 15/15]
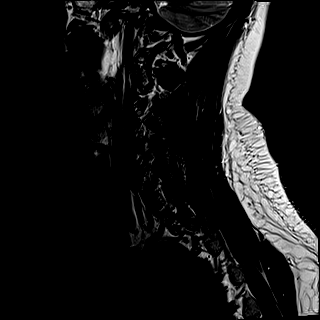

[Series 6: FLAIR · sagittal · 3.0mm · 0.78mm/px · 7 of 15 slices shown]
[im 1/15]
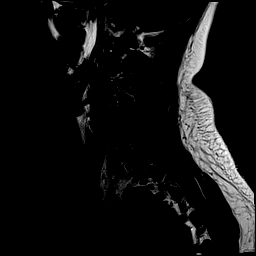
[im 3/15]
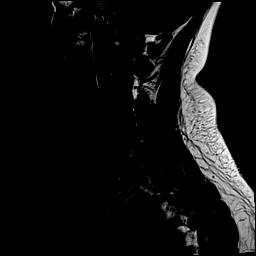
[im 5/15]
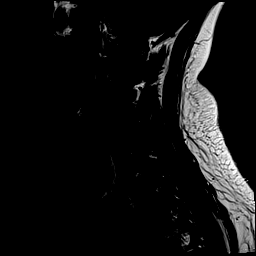
[im 8/15]
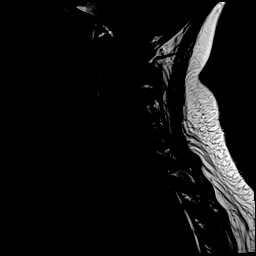
[im 10/15]
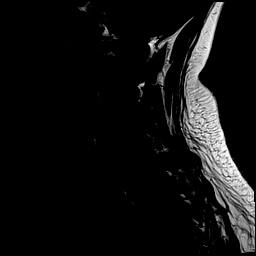
[im 12/15]
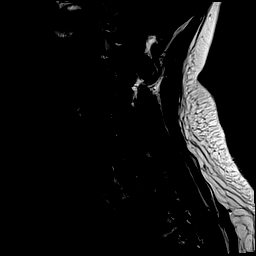
[im 15/15]
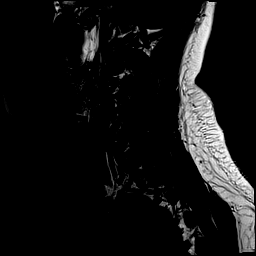

[Series 7: STIR · sagittal · 3.0mm · 0.62mm/px · 7 of 15 slices shown]
[im 1/15]
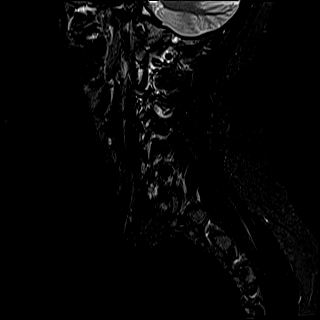
[im 3/15]
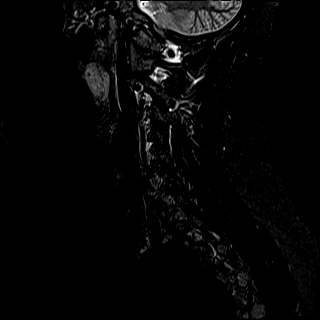
[im 5/15]
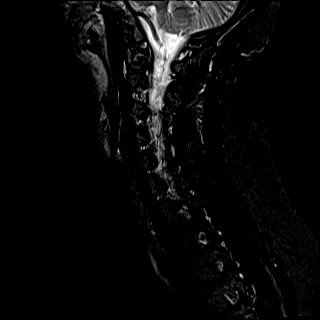
[im 8/15]
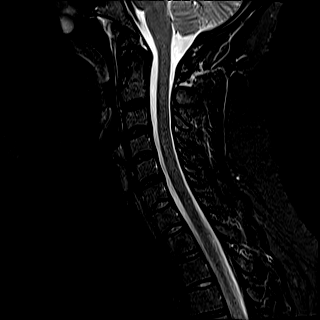
[im 10/15]
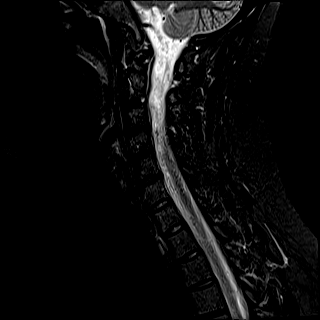
[im 12/15]
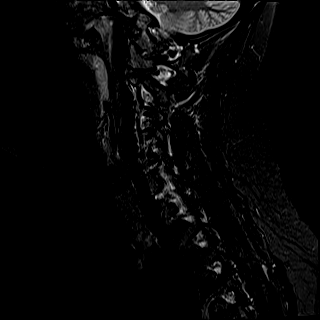
[im 15/15]
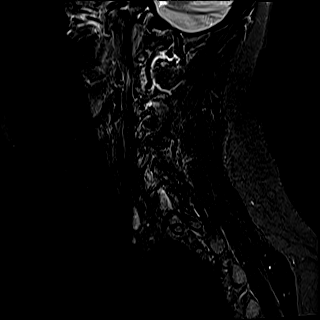

[Series 8: T2 · axial · 3.0mm · 0.70mm/px · z∈[-133,-36]mm · 11 of 29 slices shown (2 of 2)]
[im 1/29]
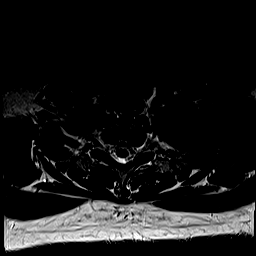
[im 3/29]
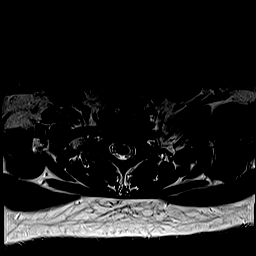
[im 5/29]
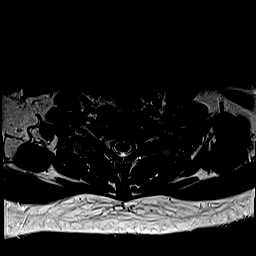
[im 7/29]
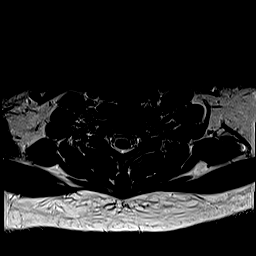
[im 9/29]
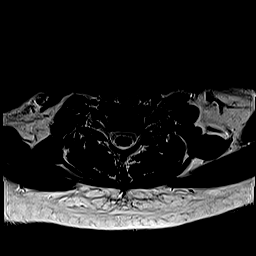
[im 11/29]
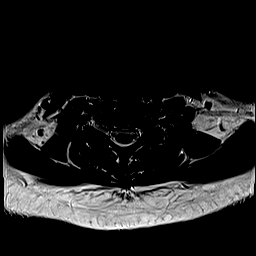
[im 13/29]
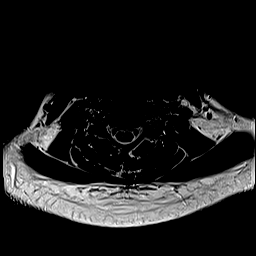
[im 16/29]
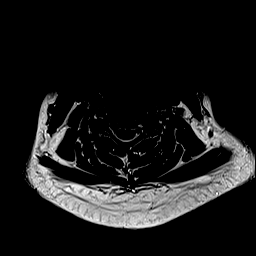
[im 20/29]
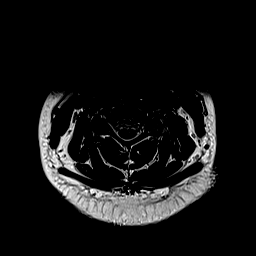
[im 24/29]
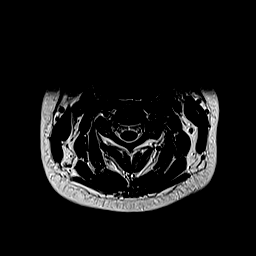
[im 29/29]
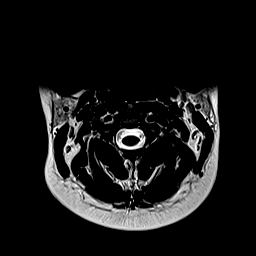

[Series 9: ax mpgr · axial · 3.0mm · 0.35mm/px · z∈[-133,-36]mm · 8 of 29 slices shown]
[im 1/29]
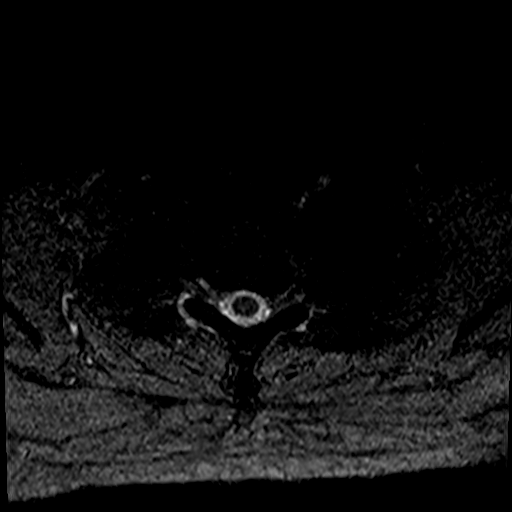
[im 5/29]
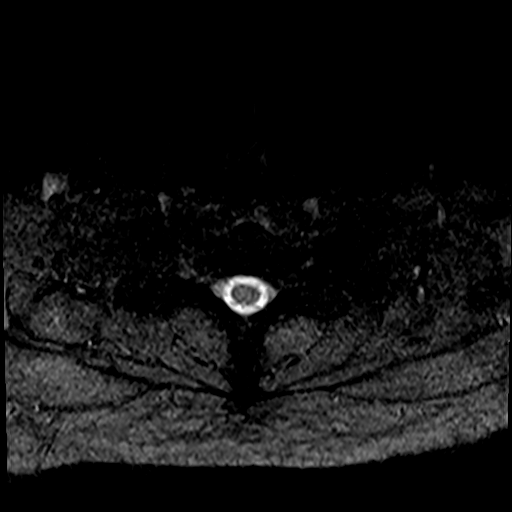
[im 9/29]
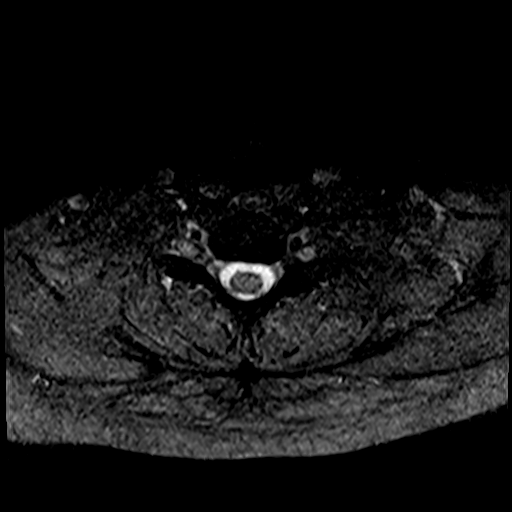
[im 13/29]
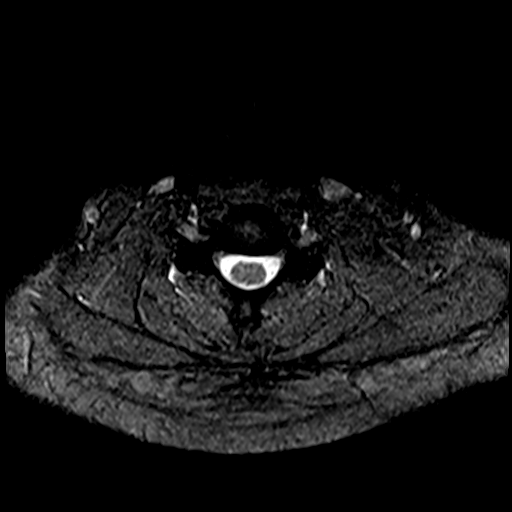
[im 16/29]
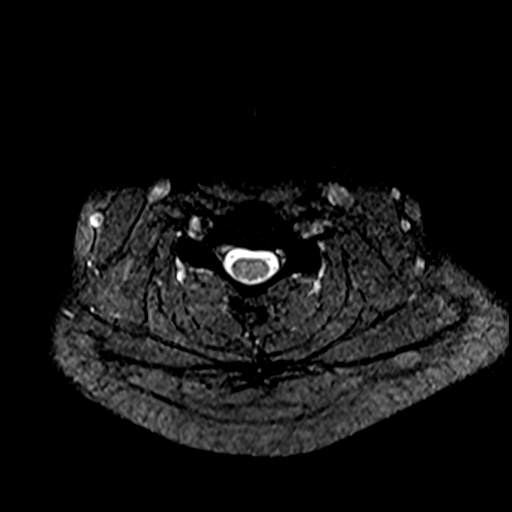
[im 20/29]
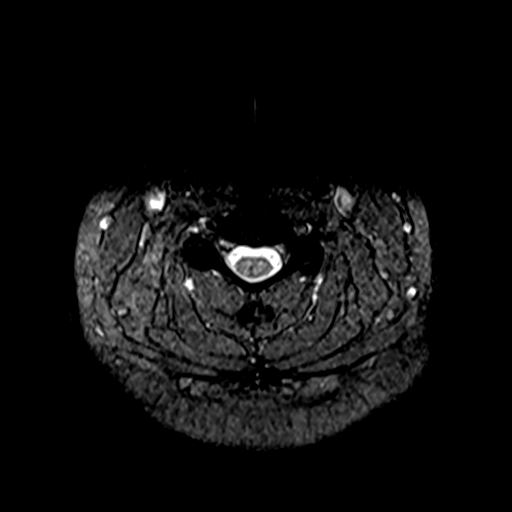
[im 24/29]
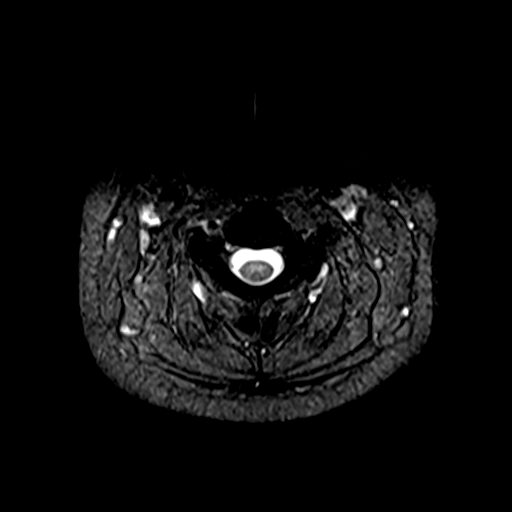
[im 29/29]
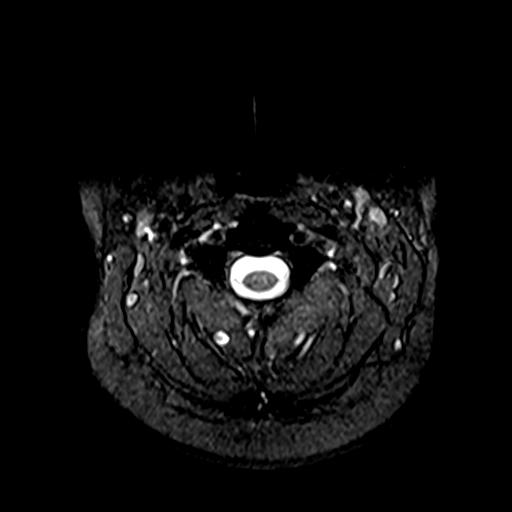

[39 of 48 positions shown; findings below may reference images not displayed]

FINDINGS: Alignment: Straightening of the expected cervical lordosis. No
significant spondylolisthesis.

Vertebrae: Vertebral body height is maintained. No significant
marrow edema or focal suspicious osseous lesion.

Cord: No signal abnormality identified within the cervical spinal
cord.

Posterior Fossa, vertebral arteries, paraspinal tissues: No
abnormality identified within included portions of the posterior
fossa. Flow voids preserved within the imaged cervical vertebral
arteries. No paraspinal mass or collection.

Disc levels:

No more than mild disc degeneration within the cervical spine.

C2-C3: No significant disc herniation or stenosis.

C3-C4: Shallow disc bulge. Mild facet arthrosis (predominantly on
the right). No significant spinal canal stenosis or neural foraminal
narrowing.

C4-C5: Shallow disc bulge. No significant spinal canal or foraminal
stenosis.

C5-C6: Shallow disc bulge. Mild facet arthrosis (predominantly on
the left). No significant spinal canal or foraminal stenosis.

C6-C7: Mild facet arthrosis (predominantly on the left). No
significant disc herniation or stenosis.

C7-T1: No significant disc herniation or stenosis.
IMPRESSION: Cervical spondylosis, as outlined. No significant spinal canal or
foraminal stenosis.

Nonspecific straightening of the expected cervical lordosis.

## 2021-08-31 ENCOUNTER — Telehealth: Payer: Managed Care, Other (non HMO) | Admitting: Emergency Medicine

## 2021-08-31 ENCOUNTER — Encounter: Payer: Self-pay | Admitting: Emergency Medicine

## 2021-08-31 DIAGNOSIS — J0140 Acute pansinusitis, unspecified: Secondary | ICD-10-CM | POA: Diagnosis not present

## 2021-08-31 MED ORDER — DOXYCYCLINE HYCLATE 100 MG PO TABS: 100.0000 mg | ORAL_TABLET | Freq: Two times a day (BID) | ORAL | 0 refills | Status: AC

## 2021-08-31 NOTE — Progress Notes (Signed)

## 2021-08-31 NOTE — Progress Notes (Signed)
I have spent 5 minutes in review of e-visit questionnaire, review and updating patient chart, medical decision making and response to patient.   Kandis Henry, PA-C    

## 2021-09-01 ENCOUNTER — Other Ambulatory Visit: Payer: Self-pay

## 2021-09-01 DIAGNOSIS — M542 Cervicalgia: Secondary | ICD-10-CM

## 2021-09-03 ENCOUNTER — Encounter: Payer: Self-pay | Admitting: Neurology

## 2021-09-04 ENCOUNTER — Other Ambulatory Visit: Payer: Self-pay

## 2021-09-04 MED ORDER — TOPIRAMATE 50 MG PO TABS: 50.0000 mg | ORAL_TABLET | Freq: Every day | ORAL | 0 refills | Status: DC

## 2021-09-04 NOTE — Telephone Encounter (Signed)
Per dr.Jaffe okay to sent in prescription for topiramate 50mg  at bedtime.  However, at this dose, I recommend remaining on it for 4 weeks before deciding if we need to increase further.

## 2021-09-05 ENCOUNTER — Ambulatory Visit
Admission: RE | Admit: 2021-09-05 | Discharge: 2021-09-05 | Disposition: A | Discharge status: Home / Self Care | Payer: Managed Care, Other (non HMO) | Source: Ambulatory Visit | Attending: Family | Admitting: Family

## 2021-09-05 DIAGNOSIS — R591 Generalized enlarged lymph nodes: Secondary | ICD-10-CM | POA: Diagnosis present

## 2021-09-07 ENCOUNTER — Encounter: Payer: Self-pay | Admitting: Certified Registered Nurse Anesthetist

## 2021-09-08 ENCOUNTER — Other Ambulatory Visit: Payer: Self-pay | Admitting: Family

## 2021-09-08 DIAGNOSIS — F419 Anxiety disorder, unspecified: Secondary | ICD-10-CM

## 2021-09-09 ENCOUNTER — Other Ambulatory Visit: Payer: Self-pay | Admitting: Family

## 2021-09-09 ENCOUNTER — Other Ambulatory Visit: Payer: Self-pay | Admitting: Neurology

## 2021-09-09 DIAGNOSIS — M542 Cervicalgia: Secondary | ICD-10-CM

## 2021-09-12 ENCOUNTER — Ambulatory Visit (AMBULATORY_SURGERY_CENTER): Payer: Managed Care, Other (non HMO) | Admitting: Internal Medicine

## 2021-09-12 ENCOUNTER — Encounter: Payer: Self-pay | Admitting: Internal Medicine

## 2021-09-12 VITALS — BP 125/82 | HR 69 | Temp 96.8°F | Resp 12 | Ht 67.0 in | Wt 215.0 lb

## 2021-09-12 DIAGNOSIS — K2950 Unspecified chronic gastritis without bleeding: Secondary | ICD-10-CM

## 2021-09-12 DIAGNOSIS — K31A0 Gastric intestinal metaplasia, unspecified: Secondary | ICD-10-CM

## 2021-09-12 DIAGNOSIS — K2970 Gastritis, unspecified, without bleeding: Secondary | ICD-10-CM

## 2021-09-12 MED ORDER — SODIUM CHLORIDE 0.9 % IV SOLN: 500.0000 mL | Freq: Once | INTRAVENOUS | Status: AC

## 2021-09-12 NOTE — Progress Notes (Signed)
Sedate, gd SR, tolerated procedure well, VSS, report to RN 

## 2021-09-12 NOTE — Op Note (Signed)
[facility] Patient Name: Melissa Walton Procedure Date: 09/12/2021 3:53 PM MRN: [id number] Endoscopist: Iva Boop , MD Age: 31 Referring MD:  Date of Birth: 1990/09/05 Gender: Female Account #: [id number] Procedure:                Upper GI endoscopy Indications:              Gastritis, Follow-up of gastritis Medicines:                Monitored Anesthesia Care Procedure:                Pre-Anesthesia Assessment:                           - Prior to the procedure, a History and Physical                            was performed, and patient medications and                            allergies were reviewed. The patient's tolerance of                            previous anesthesia was also reviewed. The risks                            and benefits of the procedure and the sedation                            options and risks were discussed with the patient.                            All questions were answered, and informed consent                            was obtained. Prior Anticoagulants: The patient has                            taken no previous anticoagulant or antiplatelet                            agents. ASA Grade Assessment: II - A patient with                            mild systemic disease. After reviewing the risks                            and benefits, the patient was deemed in                            satisfactory condition to undergo the procedure.                           After obtaining informed consent, the endoscope was  passed under direct vision. Throughout the                            procedure, the patient's blood pressure, pulse, and                            oxygen saturations were monitored continuously. The                            GIF [id number] #[id number] was introduced through the                            mouth, and advanced to the second part of duodenum.                            The upper GI  endoscopy was accomplished without                            difficulty. The patient tolerated the procedure                            well. Scope In: Scope Out: Findings:                 Patchy moderate inflammation characterized by                            congestion (edema), erosions, erythema, granularity                            and shallow ulcerations was found in the gastric                            antrum. Biopsies were taken with a cold forceps for                            histology. Verification of patient identification                            for the specimen was done. Estimated blood loss was                            minimal.                           Diffuse moderate mucosal changes characterized by                            discoloration and erythema were found in the                            gastric fundus and in the gastric body. Biopsies                            were taken with a cold forceps for histology.  Verification of patient identification for the                            specimen was done. Estimated blood loss was minimal.                           The exam was otherwise without abnormality.                           The cardia and gastric fundus were normal on                            retroflexion. Complications:            No immediate complications. Estimated Blood Loss:     Estimated blood loss was minimal. Impression:               - Gastritis. Biopsied. 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 Biopsied.                           - The examination was otherwise normal. Recommendation:           - Patient has a contact number available for                            emergencies. The signs and symptoms of potential                             delayed complications were discussed with the                            patient. Return to normal activities tomorrow.                            Written discharge instructions were provided to the                            patient.                           - Resume previous diet.                           - Continue present medications.                           - Await pathology results. [name], MD 09/12/2021 4:24:54 PM This report has been signed electronically.

## 2021-09-12 NOTE — Patient Instructions (Addendum)
There was inflammation in the stomach. Multiple stomach biopsies taken as we planned. I will contact you with results and recommendations.  I appreciate the opportunity to care for you. Iva Boop, MD, [facility]   Discharge instructions given. Biopsies taken. Resume previous medications. YOU HAD AN ENDOSCOPIC PROCEDURE TODAY AT THE [facility]:   Refer to the procedure report that was given to you for any specific questions about what was found during the examination.  If the procedure report does not answer your questions, please call your gastroenterologist to clarify.  If you requested that your care partner not be given the details of your procedure findings, then the procedure report has been included in a sealed envelope for you to review at your convenience later.  YOU SHOULD EXPECT: Some feelings of bloating in the abdomen. Passage of more gas than usual.  Walking can help get rid of the air that was put into your GI tract during the procedure and reduce the bloating. If you had a lower endoscopy (such as a colonoscopy or flexible sigmoidoscopy) you may notice spotting of blood in your stool or on the toilet paper. If you underwent a bowel prep for your procedure, you may not have a normal bowel movement for a few days.  Please Note:  You might notice some irritation and congestion in your nose or some drainage.  This is from the oxygen used during your procedure.  There is no need for concern and it should clear up in a day or so.  SYMPTOMS TO REPORT IMMEDIATELY:   Following upper endoscopy (EGD)  Vomiting of blood or coffee ground material  New chest pain or pain under the shoulder blades  Painful or persistently difficult swallowing  New shortness of breath  Fever of 100F or higher  Black, tarry-looking stools  For urgent or emergent issues, a gastroenterologist can be reached at any hour by calling [phone]. Do not use MyChart messaging for urgent concerns.     DIET:  We do recommend a small meal at first, but then you may proceed to your regular diet.  Drink plenty of fluids but you should avoid alcoholic beverages for 24 hours.  ACTIVITY:  You should plan to take it easy for the rest of today and you should NOT DRIVE or use heavy machinery until tomorrow (because of the sedation medicines used during the test).    FOLLOW UP: Our staff will call the number listed on your records the next business day following your procedure.  We will call around 7:15- 8:00 am to check on you and address any questions or concerns that you may have regarding the information given to you following your procedure. If we do not reach you, we will leave a message.  If you develop any symptoms (ie: fever, flu-like symptoms, shortness of breath, cough etc.) before then, please call [phone].  If you test positive for Covid 19 in the 2 weeks post procedure, please call and report this information to [place].    If any biopsies were taken you will be contacted by phone or by letter within the next 1-3 weeks.  Please call us at [phone] if you have not heard about the biopsies in 3 weeks.    SIGNATURES/CONFIDENTIALITY: You and/or your care partner have signed paperwork which will be entered into your electronic medical record.  These signatures attest to the fact that that the information above on your After Visit Summary has been reviewed and is  understood.  Full responsibility of the confidentiality of this discharge information lies with you and/or your care-partner.

## 2021-09-12 NOTE — Progress Notes (Signed)
History and Physical Interval Note:  09/12/2021 3:53 PM  Melissa Walton  has presented today for endoscopic procedure(s), with the diagnosis of  Encounter Diagnosis  Name Primary?   Gastric intestinal metaplasia Yes  .  The various methods of evaluation and treatment have been discussed with the patient and/or family. After consideration of risks, benefits and other options for treatment, the patient has consented to  the endoscopic procedure(s).   The patient's history has been reviewed, patient examined, no change in status, stable for endoscopic procedure(s).  I have reviewed the patient's chart and labs.  Questions were answered to the patient's satisfaction.     Iva Boop, MD, [facility]

## 2021-09-15 ENCOUNTER — Telehealth: Payer: Self-pay

## 2021-09-15 NOTE — Telephone Encounter (Signed)
Left message on answering machine. 

## 2021-09-21 ENCOUNTER — Encounter: Payer: Self-pay | Admitting: Internal Medicine

## 2021-09-25 ENCOUNTER — Encounter: Payer: Self-pay | Admitting: Internal Medicine

## 2021-10-14 ENCOUNTER — Other Ambulatory Visit: Payer: Self-pay | Admitting: Family

## 2021-10-14 DIAGNOSIS — R519 Headache, unspecified: Secondary | ICD-10-CM

## 2021-10-14 DIAGNOSIS — R5383 Other fatigue: Secondary | ICD-10-CM

## 2021-10-17 ENCOUNTER — Ambulatory Visit: Payer: Managed Care, Other (non HMO) | Admitting: Family

## 2021-10-20 ENCOUNTER — Encounter: Payer: Self-pay | Admitting: Neurology

## 2021-10-21 ENCOUNTER — Other Ambulatory Visit: Payer: Self-pay

## 2021-10-21 MED ORDER — TOPIRAMATE 50 MG PO TABS: 100.0000 mg | ORAL_TABLET | Freq: Every day | ORAL | 0 refills | Status: DC

## 2021-10-21 NOTE — Telephone Encounter (Signed)
Mychart message from patient: requesting an increase of Topiramate.  Per Dr.Aqunio, Pls have her increase Topiramate to 100mg  qhs. Pls send in updated Rx: Topiramate 100mg : take 1 tablet qhs. Thanks.  Patient advised of the increase and new script sent to the patient.

## 2021-10-25 ENCOUNTER — Encounter: Payer: Self-pay | Admitting: Family

## 2021-10-28 ENCOUNTER — Other Ambulatory Visit: Payer: Self-pay | Admitting: Family

## 2021-10-28 DIAGNOSIS — E663 Overweight: Secondary | ICD-10-CM

## 2021-10-28 MED ORDER — TIRZEPATIDE 2.5 MG/0.5ML ~~LOC~~ SOAJ: 2.5000 mg | SUBCUTANEOUS | 2 refills | Status: DC

## 2021-10-28 NOTE — Progress Notes (Signed)
Estimated body mass index is 33.58 kg/m as calculated from the following:   Height as of this encounter: 5' (1.524 m).   Weight as of this encounter: 78 kg.   -This will complicate overall prognosis 

## 2021-11-05 ENCOUNTER — Ambulatory Visit: Payer: Managed Care, Other (non HMO) | Admitting: Family

## 2021-11-05 ENCOUNTER — Telehealth: Payer: Self-pay

## 2021-11-05 ENCOUNTER — Encounter: Payer: Self-pay | Admitting: Family

## 2021-11-05 DIAGNOSIS — F32A Depression, unspecified: Secondary | ICD-10-CM

## 2021-11-05 MED ORDER — HYDROXYZINE HCL 10 MG PO TABS
ORAL_TABLET | ORAL | 0 refills | Status: DC
Start: 2021-11-05 — End: 2021-12-04

## 2021-11-05 NOTE — Telephone Encounter (Signed)
Patient states she is having her wisdom teeth removed tomorrow and will be staying awake for the procedure.  Patient states she would like to know if Rennie Plowman, FNP, would be willing to call in some anxiety medicine for her.  *Patient states her preferred pharmacy is [facility] in [place].

## 2021-11-05 NOTE — Telephone Encounter (Signed)
Sent mychart

## 2021-11-13 ENCOUNTER — Telehealth: Payer: Self-pay

## 2021-11-13 NOTE — Telephone Encounter (Signed)
LVM to inform patient that the PA for Greggory Keen was denied?

## 2021-12-04 ENCOUNTER — Other Ambulatory Visit: Payer: Self-pay | Admitting: Family

## 2021-12-04 DIAGNOSIS — F32A Depression, unspecified: Secondary | ICD-10-CM

## 2021-12-06 ENCOUNTER — Telehealth: Payer: Managed Care, Other (non HMO) | Admitting: Nurse Practitioner

## 2021-12-06 DIAGNOSIS — J0100 Acute maxillary sinusitis, unspecified: Secondary | ICD-10-CM

## 2021-12-06 MED ORDER — DOXYCYCLINE HYCLATE 100 MG PO TABS: 100.0000 mg | ORAL_TABLET | Freq: Two times a day (BID) | ORAL | 0 refills | Status: DC

## 2021-12-06 NOTE — Progress Notes (Signed)

## 2021-12-14 ENCOUNTER — Other Ambulatory Visit: Payer: Self-pay | Admitting: Neurology

## 2022-01-01 ENCOUNTER — Encounter: Payer: Self-pay | Admitting: Neurology

## 2022-01-01 NOTE — Progress Notes (Deleted)
NEUROLOGY FOLLOW UP OFFICE NOTE  Melissa Walton [id number]  Assessment/Plan:   1  Migraine without aura, with status migrainosus, intractable 2  Dizziness/lightheadedness   Migraine prevention: ***. Migraine rescue:  ***.  Zofran for nausea Limit use of pain relievers to no more than 2 days out of week to prevent risk of rebound or medication-overuse headache. Keep headache diary Follow up ***       Subjective:  Melissa Walton is a 31 year old right-handed female who follows up for migraines.   UPDATE: Last visit, increased  nortriptyline but it caused insomnia.  Discontinued and restarted topiramate in June, titrated to 100mg  at bedtime.  Intensity:  4/10 Duration:  *** with rizatriptan Frequency:  4 a week  Frequency of abortive medication: none Current NSAIDS/analgesics:  none Current triptans:  rizatriptan 10mg  Current ergotamine:  none Current anti-emetic:  Zofran Current muscle relaxants:  tizanidine 100mg  QHS PRN Current Antihypertensive medications:  none Current Antidepressant medications:  Sertraline 100mg  QD Current Anticonvulsant medications:  topiramate 25mg  QHS Current anti-CGRP:  none Current Vitamins/Herbal/Supplements:  magnesium citrate 400mg  QD Current Antihistamines/Decongestants:  hydroxyzine Other therapy:  none Hormone/birth control:  none   Caffeine:  no coffee.  Soda every now and then Diet:  Does not drink enough water.  Skips meals Exercise:  no Depression:  controlled; Anxiety:  controlled Other pain:  hip pain Sleep hygiene:  wakes up during the night   HISTORY: Migraines started 2018 and gradually got worse.  They are severe bifrontal/retroorbital/occipital pounding pain associated with nausea, photophobia, phonophobia, dizziness but no visual disturbance, vomiting, autonomic symptoms, numbness and weakness.  They would last 3-4 days with only one day of headache-freedom until next migraine.  She was started on topiramate a few  months ago.  Headaches significantly improved - they were less severe, would last less than a day and occur once a week.  In June, she had a an intractable migraine lasting a week.  She had a second intractable migraine beginning July 2.  She received a Toradol shot at her PCP's office which was ineffective.  Maxalt helped ease it but did not break it.  She was seen in the ED on 10/02/2020 where she received a migraine cocktail followed by Haldol.  Headache broke.  She now has a lingering headache.  MRI of brain with and without contrast on 08/08/2020 was normal.    In January 2023, she reports feeling constant lightheadedness.  No numbness, visual disturbance, weakness or worsening headache.  Went to the ED.  Felt palpitations but ER said heart rate was fine.  MRI brain with and without contrast and MRA of head on 04/16/2021 were normal.     Past NSAIDS/analgesics:  Excedrin Migraine Past abortive triptans:  sumatriptan (panic attacks) Past abortive ergotamine:  none Past muscle relaxants:  Flexeril Past anti-emetic:  none Past antihypertensive medications:  none Past antidepressant medications:  nortriptyline (caused insomnia) Past anticonvulsant medications:  none Past anti-CGRP:  Aimovig, Ubrelvy.  Insurance would not cover Ajovy Past vitamins/Herbal/Supplements:  none Past antihistamines/decongestants:  Zyrtec, Flonase Other past therapies:  none     Family history of headache:  brother  PAST MEDICAL HISTORY: Past Medical History:  Diagnosis Date   Anxiety    highly anxious   B12 deficiency    Chronic headaches    Complication of anesthesia    Took double anesthesia to keep her asleep, she stated "she is a red head"   Concussion    high school  Depression    Family history of adverse reaction to anesthesia    Grandfather had post op N&V, grandmother (red head woke up during surgery)   Gastric intestinal metaplasia    GERD (gastroesophageal reflux disease)    Iron deficiency  anemia    Migraine    causes weakness in arms   Motion sickness    in back seat of car   Obesity     MEDICATIONS: Current Outpatient Medications on File Prior to Visit  Medication Sig Dispense Refill   cyanocobalamin (,VITAMIN B-12,) 1000 MCG/ML injection 1000 mcg (1 mL) intramuscular injection in the thigh ( vastus lateralis) once per week for four weeks, followed by 1000 mcg IM injection once per month. 10 mL 1   doxycycline (VIBRA-TABS) 100 MG tablet Take 1 tablet (100 mg total) by mouth 2 (two) times daily. 1 po bid 20 tablet 0   hydrOXYzine (ATARAX) 10 MG tablet TAKE 1 TABLET(10 MG) BY MOUTH AT BEDTIME 30 tablet 0   NEEDLE, DISP, 25 G (BD SAFETYGLIDE NEEDLE) 25G X 5/8" MISC Used to give B-12 injections. 50 each 0   nortriptyline (PAMELOR) 50 MG capsule Take 1 capsule (50 mg total) by mouth at bedtime. 60 capsule 5   ondansetron (ZOFRAN-ODT) 4 MG disintegrating tablet Take 4 mg by mouth every 8 (eight) hours as needed.     pantoprazole (PROTONIX) 20 MG tablet TAKE 1 TABLET BY MOUTH TWICE DAILY 30 MINUTES TO 1 HOUR BEFORE A MEAL 90 tablet 1   rizatriptan (MAXALT) 10 MG tablet Take 1 tablet (10 mg total) by mouth as needed for migraine (May repeat after 2 hours.  Maximum 2 tablets in 24 hours.). May repeat in 2 hours if needed (Patient not taking: Reported on 09/12/2021) 10 tablet 5   sertraline (ZOLOFT) 100 MG tablet Take 1 tablet (100 mg total) by mouth at bedtime. 90 tablet 1   SUMAtriptan (IMITREX) 100 MG tablet Take 100 mg by mouth as directed.     SYRINGE-NEEDLE, DISP, 3 ML (BD ECLIPSE SYRINGE) 25G X 1" 3 ML MISC Used to give B-12 injections. 50 each 0   tirzepatide (MOUNJARO) 2.5 MG/0.5ML Pen Inject 2.5 mg into the skin once a week. 2 mL 2   tiZANidine (ZANAFLEX) 2 MG tablet TAKE 1 TABLET(2 MG) BY MOUTH AT BEDTIME AS NEEDED FOR MUSCLE SPASMS 15 tablet 1   topiramate (TOPAMAX) 50 MG tablet Take 2 tablets (100 mg total) by mouth at bedtime. 90 tablet 0   [DISCONTINUED] omeprazole  (PRILOSEC OTC) 20 MG tablet Take 1 tablet (20 mg total) by mouth daily. 30 tablet 6   Current Facility-Administered Medications on File Prior to Visit  Medication Dose Route Frequency Provider Last Rate Last Admin   0.9 %  sodium chloride infusion  500 mL Intravenous Once [name], MD        ALLERGIES: Allergies  Allergen Reactions   Nsaids     GI Bleed with aleve   Loracarbef Rash   Penicillins Rash    FAMILY HISTORY: Family History  Problem Relation Age of Onset   Diabetes Mother    Hyperlipidemia Mother    Hypertension Father    Gout Father    Diabetes Father    Hypertension Brother    Colon cancer Paternal Uncle    Heart disease Maternal Grandfather    Ovarian cancer Paternal Grandmother    Colon cancer Cousin    Thyroid cancer Neg Hx    Esophageal cancer Neg Hx  Objective:  *** General: No acute distress.  Patient appears well-groomed.   Head:  Normocephalic/atraumatic Eyes:  Fundi examined but not visualized Neck: supple, no paraspinal tenderness, full range of motion Heart:  Regular rate and rhythm Neurological Exam: alert and oriented to person, place, and time.  Speech fluent and not dysarthric, language intact.  CN II-XII intact. Bulk and tone normal, muscle strength 5/5 throughout.  Sensation to light touch intact.  Deep tendon reflexes 2+ throughout, toes downgoing.  Finger to nose testing intact.  Gait normal, Romberg negative.   Shon Millet, DO  CC: Rennie Plowman, FNP

## 2022-01-02 ENCOUNTER — Other Ambulatory Visit: Payer: Self-pay | Admitting: Neurology

## 2022-01-02 MED ORDER — TOPIRAMATE 100 MG PO TABS: 100.0000 mg | ORAL_TABLET | Freq: Every day | ORAL | 5 refills | Status: DC

## 2022-01-05 ENCOUNTER — Encounter: Payer: Self-pay | Admitting: Neurology

## 2022-01-05 ENCOUNTER — Ambulatory Visit: Payer: Managed Care, Other (non HMO) | Admitting: Neurology

## 2022-01-05 DIAGNOSIS — Z029 Encounter for administrative examinations, unspecified: Secondary | ICD-10-CM

## 2022-03-14 ENCOUNTER — Telehealth: Payer: Managed Care, Other (non HMO) | Admitting: Physician Assistant

## 2022-03-14 DIAGNOSIS — J019 Acute sinusitis, unspecified: Secondary | ICD-10-CM | POA: Diagnosis not present

## 2022-03-14 DIAGNOSIS — B9689 Other specified bacterial agents as the cause of diseases classified elsewhere: Secondary | ICD-10-CM | POA: Diagnosis not present

## 2022-03-14 MED ORDER — DOXYCYCLINE HYCLATE 100 MG PO TABS: 100.0000 mg | ORAL_TABLET | Freq: Two times a day (BID) | ORAL | 0 refills | Status: AC

## 2022-03-14 NOTE — Progress Notes (Signed)
E-Visit for Sinus Problems  We are sorry that you are not feeling well.  Here is how we plan to help!  Based on what you have shared with me it looks like you have sinusitis.  Sinusitis is inflammation and infection in the sinus cavities of the head.  Based on your presentation I believe you most likely have Acute Bacterial Sinusitis.  This is an infection caused by bacteria and is treated with antibiotics. I have prescribed Doxycycline 100mg by mouth twice a day for 10 days. You may use an oral decongestant such as Mucinex D or if you have glaucoma or high blood pressure use plain Mucinex. Saline nasal spray help and can safely be used as often as needed for congestion.  If you develop worsening sinus pain, fever or notice severe headache and vision changes, or if symptoms are not better after completion of antibiotic, please schedule an appointment with a health care provider.    Sinus infections are not as easily transmitted as other respiratory infection, however we still recommend that you avoid close contact with loved ones, especially the very young and elderly.  Remember to wash your hands thoroughly throughout the day as this is the number one way to prevent the spread of infection!  Home Care: Only take medications as instructed by your medical team. Complete the entire course of an antibiotic. Do not take these medications with alcohol. A steam or ultrasonic humidifier can help congestion.  You can place a towel over your head and breathe in the steam from hot water coming from a faucet. Avoid close contacts especially the very young and the elderly. Cover your mouth when you cough or sneeze. Always remember to wash your hands.  Get Help Right Away If: You develop worsening fever or sinus pain. You develop a severe head ache or visual changes. Your symptoms persist after you have completed your treatment plan.  Make sure you Understand these instructions. Will watch your  condition. Will get help right away if you are not doing well or get worse.  Thank you for choosing an e-visit.  Your e-visit answers were reviewed by a board certified advanced clinical practitioner to complete your personal care plan. Depending upon the condition, your plan could have included both over the counter or prescription medications.  Please review your pharmacy choice. Make sure the pharmacy is open so you can pick up prescription now. If there is a problem, you may contact your provider through MyChart messaging and have the prescription routed to another pharmacy.  Your safety is important to us. If you have drug allergies check your prescription carefully.   For the next 24 hours you can use MyChart to ask questions about today's visit, request a non-urgent call back, or ask for a work or school excuse. You will get an email in the next two days asking about your experience. I hope that your e-visit has been valuable and will speed your recovery.   I have spent 5 minutes in review of e-visit questionnaire, review and updating patient chart, medical decision making and response to patient.   Fiana Gladu Z Ward, PA-C    

## 2022-05-13 ENCOUNTER — Telehealth: Payer: Managed Care, Other (non HMO) | Admitting: Nurse Practitioner

## 2022-05-13 DIAGNOSIS — N3000 Acute cystitis without hematuria: Secondary | ICD-10-CM | POA: Diagnosis not present

## 2022-05-14 MED ORDER — NITROFURANTOIN MONOHYD MACRO 100 MG PO CAPS: 100.0000 mg | ORAL_CAPSULE | Freq: Two times a day (BID) | ORAL | 0 refills | Status: AC

## 2022-05-14 NOTE — Progress Notes (Signed)
E-Visit for Urinary Problems  We are sorry that you are not feeling well.  Here is how we plan to help!  Based on what you shared with me it looks like you most likely have a simple urinary tract infection.  A UTI (Urinary Tract Infection) is a bacterial infection of the bladder.  Most cases of urinary tract infections are simple to treat but a key part of your care is to encourage you to drink plenty of fluids and watch your symptoms carefully.  I have prescribed MacroBid 100 mg twice a day for 5 days.  Your symptoms should gradually improve. Call us if the burning in your urine worsens, you develop worsening fever, back pain or pelvic pain or if your symptoms do not resolve after completing the antibiotic.  Urinary tract infections can be prevented by drinking plenty of water to keep your body hydrated.  Also be sure when you wipe, wipe from front to back and don't hold it in!  If possible, empty your bladder every 4 hours.  HOME CARE Drink plenty of fluids Compete the full course of the antibiotics even if the symptoms resolve Remember, when you need to go.go. Holding in your urine can increase the likelihood of getting a UTI! GET HELP RIGHT AWAY IF: You cannot urinate You get a high fever Worsening back pain occurs You see blood in your urine You feel sick to your stomach or throw up You feel like you are going to pass out  MAKE SURE YOU  Understand these instructions. Will watch your condition. Will get help right away if you are not doing well or get worse.   Thank you for choosing an e-visit.  Your e-visit answers were reviewed by a board certified advanced clinical practitioner to complete your personal care plan. Depending upon the condition, your plan could have included both over the counter or prescription medications.  Please review your pharmacy choice. Make sure the pharmacy is open so you can pick up prescription now. If there is a problem, you may contact your  provider through [employer] and have the prescription routed to another pharmacy.  Your safety is important to [place]. If you have drug allergies check your prescription carefully.   For the next 24 hours you can use MyChart to ask questions about today's visit, request a non-urgent call back, or ask for a work or school excuse. You will get an email in the next two days asking about your experience. I hope that your e-visit has been valuable and will speed your recovery.   Meds ordered this encounter  Medications   nitrofurantoin, macrocrystal-monohydrate, (MACROBID) 100 MG capsule    Sig: Take 1 capsule (100 mg total) by mouth 2 (two) times daily for 5 days.    Dispense:  10 capsule    Refill:  0    I spent approximately 5 minutes reviewing the patient's history, current symptoms and coordinating their care today.

## 2022-06-07 ENCOUNTER — Telehealth: Payer: Managed Care, Other (non HMO) | Admitting: Family

## 2022-06-07 DIAGNOSIS — R6889 Other general symptoms and signs: Secondary | ICD-10-CM | POA: Diagnosis not present

## 2022-06-07 MED ORDER — BENZONATATE 100 MG PO CAPS: 100.0000 mg | ORAL_CAPSULE | Freq: Three times a day (TID) | ORAL | 0 refills | Status: DC | PRN

## 2022-06-07 MED ORDER — CETIRIZINE HCL 10 MG PO TABS: 10.0000 mg | ORAL_TABLET | Freq: Every day | ORAL | 1 refills | Status: DC

## 2022-06-07 MED ORDER — FLUTICASONE PROPIONATE 50 MCG/ACT NA SUSP: 2.0000 | Freq: Every day | NASAL | 6 refills | Status: DC

## 2022-06-07 NOTE — Progress Notes (Signed)
Hello, I recommend taking one and letting us know if positive. Please see below our plan. If you headache does not improve you need to be seen in person.   E-Visit for Upper Respiratory Infection   We are sorry you are not feeling well.  Here is how we plan to help!  Based on what you have shared with me, it looks like you may have a viral upper respiratory infection.  Upper respiratory infections are caused by a large number of viruses; however, rhinovirus is the most common cause.   Symptoms vary from person to person, with common symptoms including sore throat, cough, fatigue or lack of energy and feeling of general discomfort.  A low-grade fever of up to 100.4 may present, but is often uncommon.  Symptoms vary however, and are closely related to a person's age or underlying illnesses.  The most common symptoms associated with an upper respiratory infection are nasal discharge or congestion, cough, sneezing, headache and pressure in the ears and face.  These symptoms usually persist for about 3 to 10 days, but can last up to 2 weeks.  It is important to know that upper respiratory infections do not cause serious illness or complications in most cases.    Upper respiratory infections can be transmitted from person to person, with the most common method of transmission being a person's hands.  The virus is able to live on the skin and can infect other persons for up to 2 hours after direct contact.  Also, these can be transmitted when someone coughs or sneezes; thus, it is important to cover the mouth to reduce this risk.  To keep the spread of the illness at [place], good hand hygiene is very important.  This is an infection that is most likely caused by a virus. There are no specific treatments other than to help you with the symptoms until the infection runs its course.  We are sorry you are not feeling well.  Here is how we plan to help!   For nasal congestion, you may use an oral decongestants such  as Mucinex D or if you have glaucoma or high blood pressure use plain Mucinex.  Saline nasal spray or nasal drops can help and can safely be used as often as needed for congestion.  For your congestion, I have prescribed Fluticasone nasal spray one spray in each nostril twice a day and zyrtec.  If you do not have a history of heart disease, hypertension, diabetes or thyroid disease, prostate/bladder issues or glaucoma, you may also use Sudafed to treat nasal congestion.  It is highly recommended that you consult with a pharmacist or your primary care physician to ensure this medication is safe for you to take.     If you have a cough, you may use cough suppressants such as Delsym and Robitussin.  If you have glaucoma or high blood pressure, you can also use Coricidin HBP.   For cough I have prescribed for you A prescription cough medication called Tessalon Perles 100 mg. You may take 1-2 capsules every 8 hours as needed for cough  If you have a sore or scratchy throat, use a saltwater gargle-  to  teaspoon of salt dissolved in a 4-ounce to 8-ounce glass of warm water.  Gargle the solution for approximately 15-30 seconds and then spit.  It is important not to swallow the solution.  You can also use throat lozenges/cough drops and Chloraseptic spray to help with throat pain or discomfort.  Warm or cold liquids can also be helpful in relieving throat pain.  For headache, pain or general discomfort, you can use Ibuprofen or Tylenol as directed.   Some authorities believe that zinc sprays or the use of Echinacea may shorten the course of your symptoms.   HOME CARE Only take medications as instructed by your medical team. Be sure to drink plenty of fluids. Water is fine as well as fruit juices, sodas and electrolyte beverages. You may want to stay away from caffeine or alcohol. If you are nauseated, try taking small sips of liquids. How do you know if you are getting enough fluid? Your urine should be a  pale yellow or almost colorless. Get rest. Taking a steamy shower or using a humidifier may help nasal congestion and ease sore throat pain. You can place a towel over your head and breathe in the steam from hot water coming from a faucet. Using a saline nasal spray works much the same way. Cough drops, hard candies and sore throat lozenges may ease your cough. Avoid close contacts especially the very young and the elderly Cover your mouth if you cough or sneeze Always remember to wash your hands.   GET HELP RIGHT AWAY IF: You develop worsening fever. If your symptoms do not improve within 10 days You develop yellow or green discharge from your nose over 3 days. You have coughing fits You develop a severe head ache or visual changes. You develop shortness of breath, difficulty breathing or start having chest pain Your symptoms persist after you have completed your treatment plan  MAKE SURE YOU  Understand these instructions. Will watch your condition. Will get help right away if you are not doing well or get worse.  Thank you for choosing an e-visit.  Your e-visit answers were reviewed by a board certified advanced clinical practitioner to complete your personal care plan. Depending upon the condition, your plan could have included both over the counter or prescription medications.  Please review your pharmacy choice. Make sure the pharmacy is open so you can pick up prescription now. If there is a problem, you may contact your provider through [employer] and have the prescription routed to another pharmacy.  Your safety is important to [place]. If you have drug allergies check your prescription carefully.   For the next 24 hours you can use MyChart to ask questions about today's visit, request a non-urgent call back, or ask for a work or school excuse. You will get an email in the next two days asking about your experience. I hope that your e-visit has been valuable and will speed  your recovery.   Approximately 5 minutes was spent documenting and reviewing patient's chart.

## 2022-07-22 ENCOUNTER — Telehealth: Payer: Managed Care, Other (non HMO) | Admitting: Physician Assistant

## 2022-07-22 DIAGNOSIS — R3989 Other symptoms and signs involving the genitourinary system: Secondary | ICD-10-CM

## 2022-07-22 MED ORDER — NITROFURANTOIN MONOHYD MACRO 100 MG PO CAPS: 100.0000 mg | ORAL_CAPSULE | Freq: Two times a day (BID) | ORAL | 0 refills | Status: DC

## 2022-07-22 NOTE — Progress Notes (Signed)
E-Visit for Urinary Problems  We are sorry that you are not feeling well.  Here is how we plan to help!  Based on what you shared with me it looks like you most likely have a simple urinary tract infection.  A UTI (Urinary Tract Infection) is a bacterial infection of the bladder.  Most cases of urinary tract infections are simple to treat but a key part of your care is to encourage you to drink plenty of fluids and watch your symptoms carefully.  I have prescribed MacroBid 100 mg twice a day for 5 days.  Your symptoms should gradually improve. Call us if the burning in your urine worsens, you develop worsening fever, back pain or pelvic pain or if your symptoms do not resolve after completing the antibiotic.  Urinary tract infections can be prevented by drinking plenty of water to keep your body hydrated.  Also be sure when you wipe, wipe from front to back and don't hold it in!  If possible, empty your bladder every 4 hours.  HOME CARE Drink plenty of fluids Compete the full course of the antibiotics even if the symptoms resolve Remember, when you need to go.go. Holding in your urine can increase the likelihood of getting a UTI! GET HELP RIGHT AWAY IF: You cannot urinate You get a high fever Worsening back pain occurs You see blood in your urine You feel sick to your stomach or throw up You feel like you are going to pass out  MAKE SURE YOU  Understand these instructions. Will watch your condition. Will get help right away if you are not doing well or get worse.   Thank you for choosing an e-visit.  Your e-visit answers were reviewed by a board certified advanced clinical practitioner to complete your personal care plan. Depending upon the condition, your plan could have included both over the counter or prescription medications.  Please review your pharmacy choice. Make sure the pharmacy is open so you can pick up prescription now. If there is a problem, you may contact your  provider through MyChart messaging and have the prescription routed to another pharmacy.  Your safety is important to us. If you have drug allergies check your prescription carefully.   For the next 24 hours you can use MyChart to ask questions about today's visit, request a non-urgent call back, or ask for a work or school excuse. You will get an email in the next two days asking about your experience. I hope that your e-visit has been valuable and will speed your recovery.  I have spent 5 minutes in review of e-visit questionnaire, review and updating patient chart, medical decision making and response to patient.   Shayley Medlin M Melvine Julin, PA-C  

## 2022-07-30 ENCOUNTER — Other Ambulatory Visit: Payer: Self-pay | Admitting: Family

## 2022-08-14 ENCOUNTER — Telehealth: Payer: Managed Care, Other (non HMO) | Admitting: Family Medicine

## 2022-08-14 DIAGNOSIS — R300 Dysuria: Secondary | ICD-10-CM

## 2022-08-14 NOTE — Progress Notes (Signed)
Because recent UTI treatment for a suspected UTI at start of the month, I feel your condition warrants further evaluation and I recommend that you be seen in a face to face visit.   NOTE: There will be NO CHARGE for this eVisit

## 2022-09-08 ENCOUNTER — Ambulatory Visit: Payer: Managed Care, Other (non HMO) | Admitting: Dermatology

## 2022-10-21 ENCOUNTER — Ambulatory Visit: Payer: Managed Care, Other (non HMO) | Admitting: Dermatology

## 2022-12-13 ENCOUNTER — Telehealth: Payer: Managed Care, Other (non HMO) | Admitting: Family

## 2022-12-13 DIAGNOSIS — R399 Unspecified symptoms and signs involving the genitourinary system: Secondary | ICD-10-CM

## 2022-12-13 MED ORDER — SULFAMETHOXAZOLE-TRIMETHOPRIM 800-160 MG PO TABS: 1.0000 | ORAL_TABLET | Freq: Two times a day (BID) | ORAL | 0 refills | Status: DC

## 2022-12-13 NOTE — Progress Notes (Signed)
E-Visit for Urinary Problems  We are sorry that you are not feeling well.  Here is how we plan to help!  Based on what you shared with me it looks like you most likely have a simple urinary tract infection.  A UTI (Urinary Tract Infection) is a bacterial infection of the bladder.  Most cases of urinary tract infections are simple to treat but a key part of your care is to encourage you to drink plenty of fluids and watch your symptoms carefully.  I have prescribed Bactrim DS One tablet twice a day for 5 days.  Your symptoms should gradually improve. Call us if the burning in your urine worsens, you develop worsening fever, back pain or pelvic pain or if your symptoms do not resolve after completing the antibiotic.  Urinary tract infections can be prevented by drinking plenty of water to keep your body hydrated.  Also be sure when you wipe, wipe from front to back and don't hold it in!  If possible, empty your bladder every 4 hours.  HOME CARE Drink plenty of fluids Compete the full course of the antibiotics even if the symptoms resolve Remember, when you need to go.go. Holding in your urine can increase the likelihood of getting a UTI! GET HELP RIGHT AWAY IF: You cannot urinate You get a high fever Worsening back pain occurs You see blood in your urine You feel sick to your stomach or throw up You feel like you are going to pass out  MAKE SURE YOU  Understand these instructions. Will watch your condition. Will get help right away if you are not doing well or get worse.   Thank you for choosing an e-visit.  Your e-visit answers were reviewed by a board certified advanced clinical practitioner to complete your personal care plan. Depending upon the condition, your plan could have included both over the counter or prescription medications.  Please review your pharmacy choice. Make sure the pharmacy is open so you can pick up prescription now. If there is a problem, you may contact  your provider through [employer] and have the prescription routed to another pharmacy.  Your safety is important to [place]. If you have drug allergies check your prescription carefully.   For the next 24 hours you can use MyChart to ask questions about today's visit, request a non-urgent call back, or ask for a work or school excuse. You will get an email in the next two days asking about your experience. I hope that your e-visit has been valuable and will speed your recovery.  Approximately 5 minutes was spent documenting and reviewing patient's chart.

## 2022-12-24 ENCOUNTER — Telehealth: Payer: Managed Care, Other (non HMO) | Admitting: Family Medicine

## 2022-12-24 DIAGNOSIS — J069 Acute upper respiratory infection, unspecified: Secondary | ICD-10-CM

## 2022-12-24 MED ORDER — BENZONATATE 100 MG PO CAPS: 100.0000 mg | ORAL_CAPSULE | Freq: Three times a day (TID) | ORAL | 0 refills | Status: DC | PRN

## 2022-12-24 MED ORDER — AZELASTINE HCL 0.1 % NA SOLN: 2.0000 | Freq: Two times a day (BID) | NASAL | 0 refills | Status: DC

## 2022-12-24 NOTE — Progress Notes (Signed)
E-Visit for Upper Respiratory Infection  ? ?We are sorry you are not feeling well.  Here is how we plan to help! ? ?Based on what you have shared with me, it looks like you may have a viral upper respiratory infection.  Upper respiratory infections are caused by a large number of viruses; however, rhinovirus is the most common cause.  ? ?Symptoms vary from person to person, with common symptoms including sore throat, cough, fatigue or lack of energy and feeling of general discomfort.  A low-grade fever of up to 100.4 may present, but is often uncommon.  Symptoms vary however, and are closely related to a person's age or underlying illnesses.  The most common symptoms associated with an upper respiratory infection are nasal discharge or congestion, cough, sneezing, headache and pressure in the ears and face.  These symptoms usually persist for about 3 to 10 days, but can last up to 2 weeks.  It is important to know that upper respiratory infections do not cause serious illness or complications in most cases.   ? ?Upper respiratory infections can be transmitted from person to person, with the most common method of transmission being a person's hands.  The virus is able to live on the skin and can infect other persons for up to 2 hours after direct contact.  Also, these can be transmitted when someone coughs or sneezes; thus, it is important to cover the mouth to reduce this risk.  To keep the spread of the illness at [place], good hand hygiene is very important. ? ?This is an infection that is most likely caused by a virus. There are no specific treatments other than to help you with the symptoms until the infection runs its course.  We are sorry you are not feeling well.  Here is how we plan to help! ? ? ?For nasal congestion, you may use an oral decongestants such as Mucinex D or if you have glaucoma or high blood pressure use plain Mucinex.  Saline nasal spray or nasal drops can help and can safely be used as often as  needed for congestion.  For your congestion, I have prescribed Azelastine nasal spray two sprays in each nostril twice a day ? ?If you do not have a history of heart disease, hypertension, diabetes or thyroid disease, prostate/bladder issues or glaucoma, you may also use Sudafed to treat nasal congestion.  It is highly recommended that you consult with a pharmacist or your primary care physician to ensure this medication is safe for you to take.    ? ?If you have a cough, you may use cough suppressants such as Delsym and Robitussin.  If you have glaucoma or high blood pressure, you can also use Coricidin HBP.   ?For cough I have prescribed for you A prescription cough medication called Tessalon Perles 100 mg. You may take 1-2 capsules every 8 hours as needed for cough ? ?If you have a sore or scratchy throat, use a saltwater gargle- ? to ? teaspoon of salt dissolved in a 4-ounce to 8-ounce glass of warm water.  Gargle the solution for approximately 15-30 seconds and then spit.  It is important not to swallow the solution.  You can also use throat lozenges/cough drops and Chloraseptic spray to help with throat pain or discomfort.  Warm or cold liquids can also be helpful in relieving throat pain. ? ?For headache, pain or general discomfort, you can use Ibuprofen or Tylenol as directed.   ?Some authorities believe  that zinc sprays or the use of Echinacea may shorten the course of your symptoms. ? ? ?HOME CARE ?Only take medications as instructed by your medical team. ?Be sure to drink plenty of fluids. Water is fine as well as fruit juices, sodas and electrolyte beverages. You may want to stay away from caffeine or alcohol. If you are nauseated, try taking small sips of liquids. How do you know if you are getting enough fluid? Your urine should be a pale yellow or almost colorless. ?Get rest. ?Taking a steamy shower or using a humidifier may help nasal congestion and ease sore throat pain. You can place a towel over  your head and breathe in the steam from hot water coming from a faucet. ?Using a saline nasal spray works much the same way. ?Cough drops, hard candies and sore throat lozenges may ease your cough. ?Avoid close contacts especially the very young and the elderly ?Cover your mouth if you cough or sneeze ?Always remember to wash your hands.  ? ?GET HELP RIGHT AWAY IF: ?You develop worsening fever. ?If your symptoms do not improve within 10 days ?You develop yellow or green discharge from your nose over 3 days. ?You have coughing fits ?You develop a severe head ache or visual changes. ?You develop shortness of breath, difficulty breathing or start having chest pain ?Your symptoms persist after you have completed your treatment plan ? ?MAKE SURE YOU  ?Understand these instructions. ?Will watch your condition. ?Will get help right away if you are not doing well or get worse. ? ?Thank you for choosing an e-visit. ? ?Your e-visit answers were reviewed by a board certified advanced clinical practitioner to complete your personal care plan. Depending upon the condition, your plan could have included both over the counter or prescription medications. ? ?Please review your pharmacy choice. Make sure the pharmacy is open so you can pick up prescription now. If there is a problem, you may contact your provider through [employer] and have the prescription routed to another pharmacy.  Your safety is important to [place]. If you have drug allergies check your prescription carefully.  ? ?For the next 24 hours you can use MyChart to ask questions about today's visit, request a non-urgent call back, or ask for a work or school excuse. ?You will get an email in the next two days asking about your experience. I hope that your e-visit has been valuable and will speed your recovery. ? ? ? ?I provided 5 minutes of non face-to-face time during this encounter for chart review, medication and order placement, as well as and documentation.   ? ?

## 2023-03-12 ENCOUNTER — Telehealth: Payer: Managed Care, Other (non HMO) | Admitting: Physician Assistant

## 2023-03-12 DIAGNOSIS — R3989 Other symptoms and signs involving the genitourinary system: Secondary | ICD-10-CM | POA: Diagnosis not present

## 2023-03-12 MED ORDER — NITROFURANTOIN MONOHYD MACRO 100 MG PO CAPS: 100.0000 mg | ORAL_CAPSULE | Freq: Two times a day (BID) | ORAL | 0 refills | Status: DC

## 2023-03-12 NOTE — Progress Notes (Signed)

## 2023-03-26 ENCOUNTER — Ambulatory Visit: Payer: Managed Care, Other (non HMO) | Admitting: Family

## 2023-04-01 ENCOUNTER — Ambulatory Visit: Payer: Managed Care, Other (non HMO) | Admitting: Dermatology

## 2023-04-07 ENCOUNTER — Ambulatory Visit: Payer: Managed Care, Other (non HMO) | Admitting: Family

## 2023-04-18 ENCOUNTER — Telehealth: Payer: Managed Care, Other (non HMO) | Admitting: Nurse Practitioner

## 2023-04-18 DIAGNOSIS — R3989 Other symptoms and signs involving the genitourinary system: Secondary | ICD-10-CM | POA: Diagnosis not present

## 2023-04-18 MED ORDER — NITROFURANTOIN MONOHYD MACRO 100 MG PO CAPS: 100.0000 mg | ORAL_CAPSULE | Freq: Two times a day (BID) | ORAL | 0 refills | Status: DC

## 2023-04-18 NOTE — Progress Notes (Signed)

## 2023-04-18 NOTE — Progress Notes (Signed)
 I have spent 5 minutes in review of e-visit questionnaire, review and updating patient chart, medical decision making and response to patient.   Claiborne Rigg, NP

## 2023-04-22 ENCOUNTER — Encounter: Payer: Self-pay | Admitting: Family

## 2023-04-22 ENCOUNTER — Ambulatory Visit: Payer: Managed Care, Other (non HMO) | Admitting: Dermatology

## 2023-04-22 ENCOUNTER — Ambulatory Visit: Payer: Managed Care, Other (non HMO) | Admitting: Family

## 2023-04-22 VITALS — BP 128/78 | HR 78 | Temp 98.0°F | Ht 67.0 in | Wt 225.8 lb

## 2023-04-22 DIAGNOSIS — M542 Cervicalgia: Secondary | ICD-10-CM

## 2023-04-22 DIAGNOSIS — L309 Dermatitis, unspecified: Secondary | ICD-10-CM

## 2023-04-22 DIAGNOSIS — R519 Headache, unspecified: Secondary | ICD-10-CM

## 2023-04-22 DIAGNOSIS — L659 Nonscarring hair loss, unspecified: Secondary | ICD-10-CM

## 2023-04-22 DIAGNOSIS — B078 Other viral warts: Secondary | ICD-10-CM

## 2023-04-22 DIAGNOSIS — E663 Overweight: Secondary | ICD-10-CM | POA: Diagnosis not present

## 2023-04-22 LAB — LIPID PANEL
Cholesterol: 211 mg/dL — ABNORMAL HIGH (ref 0–200)
HDL: 50.8 mg/dL (ref >39.00)
LDL Cholesterol: 133 mg/dL — ABNORMAL HIGH (ref 0–99)
NonHDL: 159.98
Total CHOL/HDL Ratio: 4
Triglycerides: 136 mg/dL (ref 0.0–149.0)
VLDL: 27.2 mg/dL (ref 0.0–40.0)

## 2023-04-22 LAB — CBC WITH DIFFERENTIAL/PLATELET
Basophils Absolute: 0.1 10*3/uL (ref 0.0–0.1)
Basophils Relative: 1.4 % (ref 0.0–3.0)
Eosinophils Absolute: 0.1 10*3/uL (ref 0.0–0.7)
Eosinophils Relative: 2.5 % (ref 0.0–5.0)
HCT: 39.4 % (ref 36.0–46.0)
Hemoglobin: 13.1 g/dL (ref 12.0–15.0)
Lymphocytes Relative: 28.6 % (ref 12.0–46.0)
Lymphs Abs: 1.3 10*3/uL (ref 0.7–4.0)
MCHC: 33.4 g/dL (ref 30.0–36.0)
MCV: 94.6 fL (ref 78.0–100.0)
Monocytes Absolute: 0.5 10*3/uL (ref 0.1–1.0)
Monocytes Relative: 10.1 % (ref 3.0–12.0)
Neutro Abs: 2.6 10*3/uL (ref 1.4–7.7)
Neutrophils Relative %: 57.4 % (ref 43.0–77.0)
Platelets: 279 10*3/uL (ref 150.0–400.0)
RBC: 4.16 Mil/uL (ref 3.87–5.11)
RDW: 12.7 % (ref 11.5–15.5)
WBC: 4.6 10*3/uL (ref 4.0–10.5)

## 2023-04-22 LAB — COMPREHENSIVE METABOLIC PANEL
ALT: 29 U/L (ref 0–35)
AST: 19 U/L (ref 0–37)
Albumin: 4.3 g/dL (ref 3.5–5.2)
Alkaline Phosphatase: 62 U/L (ref 39–117)
BUN: 13 mg/dL (ref 6–23)
CO2: 26 meq/L (ref 19–32)
Calcium: 9.2 mg/dL (ref 8.4–10.5)
Chloride: 103 meq/L (ref 96–112)
Creatinine, Ser: 0.66 mg/dL (ref 0.40–1.20)
GFR: 117.8 mL/min (ref >60.00)
Glucose, Bld: 75 mg/dL (ref 70–99)
Potassium: 4.1 meq/L (ref 3.5–5.1)
Sodium: 137 meq/L (ref 135–145)
Total Bilirubin: 0.4 mg/dL (ref 0.2–1.2)
Total Protein: 6.8 g/dL (ref 6.0–8.3)

## 2023-04-22 LAB — VITAMIN D 25 HYDROXY (VIT D DEFICIENCY, FRACTURES): VITD: 29.22 ng/mL — ABNORMAL LOW (ref 30.00–100.00)

## 2023-04-22 LAB — HEMOGLOBIN A1C: Hgb A1c MFr Bld: 5.3 % (ref 4.6–6.5)

## 2023-04-22 LAB — TSH: TSH: 2.17 u[IU]/mL (ref 0.35–5.50)

## 2023-04-22 MED ORDER — ONDANSETRON 4 MG PO TBDP: 4.0000 mg | ORAL_TABLET | Freq: Three times a day (TID) | ORAL | 1 refills | Status: AC | PRN

## 2023-04-22 MED ORDER — TOPIRAMATE 25 MG PO TABS: 25.0000 mg | ORAL_TABLET | Freq: Every day | ORAL | 1 refills | Status: DC

## 2023-04-22 MED ORDER — MOMETASONE FUROATE 0.1 % EX CREA: 1.0000 | TOPICAL_CREAM | Freq: Every day | CUTANEOUS | 2 refills | Status: DC | PRN

## 2023-04-22 MED ORDER — TIZANIDINE HCL 2 MG PO TABS: 2.0000 mg | ORAL_TABLET | Freq: Every evening | ORAL | 1 refills | Status: AC | PRN

## 2023-04-22 MED ORDER — METFORMIN HCL ER 500 MG PO TB24: 500.0000 mg | ORAL_TABLET | Freq: Every evening | ORAL | 2 refills | Status: DC

## 2023-04-22 MED ORDER — RIZATRIPTAN BENZOATE 10 MG PO TABS: 10.0000 mg | ORAL_TABLET | ORAL | 5 refills | Status: AC | PRN

## 2023-04-22 NOTE — Patient Instructions (Addendum)
 Recommend minoxidil  5% (Rogaine  for men) solution or foam to be applied to the scalp and left in. This should ideally be used twice daily for best results but it helps with hair regrowth when used at least three times per week. Rogaine  initially can cause increased hair shedding for the first few weeks but this will stop with continued use. In studies, people who used minoxidil  (Rogaine ) for at least 6 months had thicker hair than people who did not. Minoxidil  topical (Rogaine ) only works as long as it continues to be used. If if it is no longer used then the hair it has been helping to regrow can fall out. Minoxidil  topical (Rogaine ) can cause increased facial hair growth which can usually be managed easily with a battery-operated hair trimmer. If facial hair growth is bothersome, switching to the 2% women's version can decrease the risk of unwanted facial hair growth.       Cryotherapy Aftercare  Wash gently with soap and water everyday.   Apply Vaseline and Band-Aid daily until healed.      Gentle Skin Care Guide  1. Bathe no more than once a day.  2. Avoid bathing in hot water  3. Use a mild soap like Dove, Vanicream, Cetaphil, CeraVe. Can use Lever 2000 or Cetaphil antibacterial soap  4. Use soap only where you need it. On most days, use it under your arms, between your legs, and on your feet. Let the water rinse other areas unless visibly dirty.  5. When you get out of the bath/shower, use a towel to gently blot your skin dry, don't rub it.  6. While your skin is still a little damp, apply a moisturizing cream such as Vanicream, CeraVe, Cetaphil, Eucerin, Sarna lotion or plain Vaseline Jelly. For hands apply Neutrogena Norwegian Hand Cream or Excipial Hand Cream.  7. Reapply moisturizer any time you start to itch or feel dry.  8. Sometimes using free and clear laundry detergents can be helpful. Fabric softener sheets should be avoided. Downy Free & Gentle liquid, or any liquid  fabric softener that is free of dyes and perfumes, it acceptable to use  9. If your doctor has given you prescription creams you may apply moisturizers over them          Due to recent changes in healthcare laws, you may see results of your pathology and/or laboratory studies on MyChart before the doctors have had a chance to review them. We understand that in some cases there may be results that are confusing or concerning to you. Please understand that not all results are received at the same time and often the doctors may need to interpret multiple results in order to provide you with the best plan of care or course of treatment. Therefore, we ask that you please give us  2 business days to thoroughly review all your results before contacting the office for clarification. Should we see a critical lab result, you will be contacted sooner.   If You Need Anything After Your Visit  If you have any questions or concerns for your doctor, please call our main line at [phone] and press option 4 to reach your doctor's medical assistant. If no one answers, please leave a voicemail as directed and we will return your call as soon as possible. Messages left after 4 pm will be answered the following business day.   You may also send us  a message via MyChart. We typically respond to MyChart messages within 1-2 business days.  For prescription refills, please ask your pharmacy to contact our office. Our fax number is [phone].  If you have an urgent issue when the clinic is closed that cannot wait until the next business day, you can page your doctor at the number below.    Please note that while we do our best to be available for urgent issues outside of office hours, we are not available 24/7.   If you have an urgent issue and are unable to reach us , you may choose to seek medical care at your doctor's office, retail clinic, urgent care center, or emergency room.  If you have a medical  emergency, please immediately call 911 or go to the emergency department.  Pager Numbers  - Dr. Hester: [phone]  - Dr. Jackquline: [phone]  - Dr. Claudene: [phone]   In the event of inclement weather, please call our main line at [phone] for an update on the status of any delays or closures.  Dermatology Medication Tips: Please keep the boxes that topical medications come in in order to help keep track of the instructions about where and how to use these. Pharmacies typically print the medication instructions only on the boxes and not directly on the medication tubes.   If your medication is too expensive, please contact our office at [phone] option 4 or send us  a message through MyChart.   We are unable to tell what your co-pay for medications will be in advance as this is different depending on your insurance coverage. However, we may be able to find a substitute medication at lower cost or fill out paperwork to get insurance to cover a needed medication.   If a prior authorization is required to get your medication covered by your insurance company, please allow us  1-2 business days to complete this process.  Drug prices often vary depending on where the prescription is filled and some pharmacies may offer cheaper prices.  The website www.goodrx.com contains coupons for medications through different pharmacies. The prices here do not account for what the cost may be with help from insurance (it may be cheaper with your insurance), but the website can give you the price if you did not use any insurance.  - You can print the associated coupon and take it with your prescription to the pharmacy.  - You may also stop by our office during regular business hours and pick up a GoodRx coupon card.  - If you need your prescription sent electronically to a different pharmacy, notify our office through [facility] or by phone at [phone] option 4.     Si Usted  Necesita Algo Despus de Su Visita  Tambin puede enviarnos un mensaje a travs de Clinical Cytogeneticist. Por lo general respondemos a los mensajes de MyChart en el transcurso de 1 a 2 das hbiles.  Para renovar recetas, por favor pida a su farmacia que se ponga en contacto con nuestra oficina. Randi lakes de fax es [place] [phone].  Si tiene un asunto urgente cuando la clnica est cerrada y que no puede esperar hasta el siguiente da hbil, puede llamar/localizar a su doctor(a) al nmero que aparece a continuacin.   Por favor, tenga en cuenta que aunque hacemos todo lo posible para estar disponibles para asuntos urgentes fuera del horario de [place], no estamos disponibles las 24 horas del da, los 7 [address]  [address] de la [place].   Si tiene un problema urgente y no puede comunicarse con nosotros, puede optar por buscar atencin mdica  en el consultorio de su doctor(a), en una clnica privada, en un centro de atencin urgente o en una sala de emergencias.  Si tiene engineer, drilling, por favor llame inmediatamente al 911 o vaya a la sala de emergencias.  Nmeros de bper  - Dr. Hester: [phone]  - Dra. Jackquline: [phone]  - Dr. Claudene: [phone]   En caso de inclemencias del tiempo, por favor llame a landry capes principal al [phone] para una actualizacin sobre el [place] de cualquier retraso o cierre.  Consejos para la medicacin en dermatologa: Por favor, guarde las cajas en las que vienen los medicamentos de uso tpico para ayudarle a seguir las instrucciones sobre dnde y cmo usarlos. Las farmacias generalmente imprimen las instrucciones del medicamento slo en las cajas y no directamente en los tubos del [place].   Si su medicamento es muy caro, por favor, pngase en contacto con landry rieger llamando al [phone] y presione la opcin 4 o envenos un mensaje a travs de Clinical Cytogeneticist.   No podemos decirle cul ser su copago por los medicamentos por adelantado ya que esto  es diferente dependiendo de la cobertura de su seguro. Sin embargo, es posible que podamos encontrar un medicamento sustituto a audiological scientist un formulario para que el seguro cubra el medicamento que se considera necesario.   Si se requiere una autorizacin previa para que su compaa de seguros cubra su medicamento, por favor permtanos de 1 a 2 das hbiles para completar este proceso.  Los precios de los medicamentos varan con frecuencia dependiendo del environmental consultant de dnde se surte la receta y alguna farmacias pueden ofrecer precios ms baratos.  El sitio web www.goodrx.com tiene cupones para medicamentos de health and safety inspector. Los precios aqu no tienen en cuenta lo que podra costar con la ayuda del seguro (puede ser ms barato con su seguro), pero el sitio web puede darle el precio si no utiliz tourist information centre manager.  - Puede imprimir el cupn correspondiente y llevarlo con su receta a la farmacia.  - Tambin puede pasar por nuestra oficina durante el horario de atencin regular y education officer, museum una tarjeta de cupones de GoodRx.  - Si necesita que su receta se enve electrnicamente a una farmacia diferente, informe a nuestra oficina a travs de MyChart de [place] o por telfono llamando al [phone] y presione la opcin 4.

## 2023-04-22 NOTE — Progress Notes (Signed)
 Assessment & Plan:  Nonintractable headache, unspecified chronicity pattern, unspecified headache type Assessment & Plan: Chronic, similar to headaches in the past.  Suboptimal control.  Trial of Topamax  and discussed titration.  Orders: -     VITAMIN D  25 Hydroxy (Vit-D Deficiency, Fractures) -     Rizatriptan  Benzoate; Take 1 tablet (10 mg total) by mouth as needed for migraine (May repeat after 2 hours.  Maximum 2 tablets in 24 hours.). May repeat in 2 hours if needed  Dispense: 10 tablet; Refill: 5 -     Topiramate ; Take 1 tablet (25 mg total) by mouth at bedtime.  Dispense: 90 tablet; Refill: 1  Cervicalgia -     tiZANidine  HCl; Take 1 tablet (2 mg total) by mouth at bedtime as needed for muscle spasms. TAKE 1 TABLET(2 MG) BY MOUTH AT BEDTIME AS NEEDED FOR MUSCLE SPASMS  Dispense: 30 tablet; Refill: 1  Hair thinning Assessment & Plan: Female pattern hair loss.  Advised over-the-counter Rogaine  for women, over-the-counter hair skin and nail supplement.  If no improvement, advised dermatology consult   Overweight (BMI 25.0-29.9) Assessment & Plan: Start metformin  to aid in weight loss.  Counseled on metformin , side effects and titration.  Orders: -     CBC with Differential/Platelet -     Comprehensive metabolic panel -     Lipid panel -     TSH -     Hemoglobin A1c -     Insulin , random -     metFORMIN  HCl ER; Take 1 tablet (500 mg total) by mouth every evening.  Dispense: 90 tablet; Refill: 2  Other orders -     Ondansetron ; Take 1 tablet (4 mg total) by mouth every 8 (eight) hours as needed for nausea or vomiting.  Dispense: 30 tablet; Refill: 1     Return precautions given.   Risks, benefits, and alternatives of the medications and treatment plan prescribed today were discussed, and patient expressed understanding.   Education regarding symptom management and diagnosis given to patient on AVS either electronically or printed.  Return in about 2 months (around  06/20/2023).  [name], FNP  Subjective:    Patient ID: Melissa Walton, female    DOB: October 25, 1990, 33 y.o.   MRN: [id number]  CC: Melissa Walton is a 33 y.o. female who presents today for an acute visit.    HPI: Complains of bilateral temporal HA behind her eyes. This is unchanged. HA feel similar to prior HA.  Denies aura, vision changes, vision loss   Endorses photophobia, nausea.   Smells ( perfume)  and bright light ( such as eye exam) trigger HA. HA is worse prior to menses starting.   No caffeine.   No daily NSAIDs.   HA are not worse HA of life. HA are not positional. No HA with valsalva.   She also complains of hair thinning.        She is not taking nortriptyline  , topamax , effexor  for migraine.     She is taking maxalt  for rescue with relief.   Last seen by Dr Lane, neurology for headache; treated with medrol ; started effexor  however caused palpitations. Consider nortriptyline .   MRI brain 08/08/2020, no acute findings.  Diffuse hair thinning, less noticeable at the crown.  She is also frustrated by weight and difficulty losing weight.  Interested in weight loss medicine  Allergies: Almond (diagnostic), Nsaids, Lactose intolerance (gi), Loracarbef, Other, and Penicillins Current Outpatient Medications on File Prior to Visit  Medication Sig Dispense Refill   azelastine  (ASTELIN ) 0.1 % nasal spray Place 2 sprays into both nostrils 2 (two) times daily. Use in each nostril as directed 30 mL 0   cetirizine  (ZYRTEC  ALLERGY) 10 MG tablet Take 1 tablet (10 mg total) by mouth daily. 90 tablet 1   MAGNESIUM  GLYCINATE PO Take 600 mg by mouth at bedtime.     nitrofurantoin , macrocrystal-monohydrate, (MACROBID ) 100 MG capsule Take 1 capsule (100 mg total) by mouth 2 (two) times daily. 10 capsule 0   Vitamin D , Ergocalciferol , (DRISDOL) 1.25 MG (50000 UNIT) CAPS capsule Take 50,000 Units by mouth every 7 (seven) days.     mometasone  (ELOCON ) 0.1 % cream  Apply 1 Application topically daily as needed (Rash). 45 g 2   [DISCONTINUED] omeprazole  (PRILOSEC  OTC) 20 MG tablet Take 1 tablet (20 mg total) by mouth daily. 30 tablet 6   Current Facility-Administered Medications on File Prior to Visit  Medication Dose Route Frequency Provider Last Rate Last Admin   0.9 %  sodium chloride  infusion  500 mL Intravenous Once Avram Lupita BRAVO, MD        Review of Systems  Constitutional:  Negative for chills and fever.  Eyes:  Negative for visual disturbance.  Respiratory:  Negative for cough.   Cardiovascular:  Negative for chest pain and palpitations.  Gastrointestinal:  Positive for nausea. Negative for vomiting.  Neurological:  Positive for headaches.      Objective:    BP 128/78   Pulse 78   Temp 98 F (36.7 C) (Oral)   Ht 5' 7 (1.702 m)   Wt 225 lb 12.8 oz (102.4 kg)   LMP  (LMP Unknown)   SpO2 98%   BMI 35.37 kg/m   BP Readings from Last 3 Encounters:  04/22/23 128/78  09/12/21 125/82  08/14/21 130/86   Wt Readings from Last 3 Encounters:  04/22/23 225 lb 12.8 oz (102.4 kg)  09/12/21 215 lb (97.5 kg)  08/14/21 215 lb (97.5 kg)    Physical Exam Vitals reviewed.  Constitutional:      Appearance: She is well-developed.  HENT:     Head:     Comments: Diffusely thinning hair over crown and scalp.  No patches of hairloss  Eyes:     Conjunctiva/sclera: Conjunctivae normal.  Neck:     Thyroid : No thyroid  mass, thyromegaly or thyroid  tenderness.  Cardiovascular:     Rate and Rhythm: Normal rate and regular rhythm.     Pulses: Normal pulses.     Heart sounds: Normal heart sounds.  Pulmonary:     Effort: Pulmonary effort is normal.     Breath sounds: Normal breath sounds. No wheezing, rhonchi or rales.  Skin:    General: Skin is warm and dry.  Neurological:     Mental Status: She is alert.  Psychiatric:        Speech: Speech normal.        Behavior: Behavior normal.        Thought Content: Thought content normal.

## 2023-04-22 NOTE — Progress Notes (Signed)
 New Patient Visit   Subjective  Melissa Walton is a 33 y.o. female who presents for the following: Warts on hands. Treated by a different dermatologist in past with LN2. States this did not help. Patient would also like to discuss hair loss.   The following portions of the chart were reviewed this encounter and updated as appropriate: medications, allergies, medical history  Review of Systems:  No other skin or systemic complaints except as noted in HPI or Assessment and Plan.  Objective  Well appearing patient in no apparent distress; mood and affect are within normal limits.  A focused examination was performed of the following areas: Hands   Relevant exam findings are noted in the Assessment and Plan.  right hand x 7, left hand x 12 (19) Multiple flat pink/flesh verrucous papules -- Discussed viral etiology and contagion.   Assessment & Plan   OTHER VIRAL WARTS (19) right hand x 7, left hand x 12 (19) Destruction of lesion - right hand x 7, left hand x 12 (19)  Destruction method: cryotherapy   Informed consent: discussed and consent obtained   Timeout:  patient name, date of birth, surgical site, and procedure verified Lesion destroyed using liquid nitrogen: Yes   Region frozen until ice ball extended beyond lesion: Yes   Outcome: patient tolerated procedure well with no complications   Post-procedure details: wound care instructions given   Additional details:  Prior to procedure, discussed risks of blister formation, small wound, skin dyspigmentation, or rare scar following cryotherapy. Recommend Vaseline ointment to treated areas while healing.  HAND DERMATITIS  HAND DERMATITIS Exam Scaly pink plaques +/- fissures on her hands   Chronic and persistent condition with duration or expected duration over one year. Condition is symptomatic/ bothersome to patient. Not currently at goal.   Hand Dermatitis is a chronic type of eczema that can come and go on the hands  and fingers.  While there is no cure, the rash and symptoms can be managed with topical prescription medications, and for more severe cases, with systemic medications.  Recommend mild soap and routine use of moisturizing cream after handwashing.  Minimize soap/water exposure when possible.    Treatment Plan Start Mometasone  cream apply to hands qd-bid prn Use a good moisturizer daily   Routine skin care guide given Related Medications mometasone  (ELOCON ) 0.1 % cream Apply 1 Application topically daily as needed (Rash).   HAIR THINNING Scalp- frontal scalp hair thinning,    We will address hair loss in depth at her next office visit  Recommend minoxidil  5% (Rogaine  for men) solution or foam to be applied to the scalp and left in. This should ideally be used twice daily for best results but it helps with hair regrowth when used at least three times per week. Rogaine  initially can cause increased hair shedding for the first few weeks but this will stop with continued use. In studies, people who used minoxidil  (Rogaine ) for at least 6 months had thicker hair than people who did not. Minoxidil  topical (Rogaine ) only works as long as it continues to be used. If if it is no longer used then the hair it has been helping to regrow can fall out. Minoxidil  topical (Rogaine ) can cause increased facial hair growth which can usually be managed easily with a battery-operated hair trimmer. If facial hair growth is bothersome, switching to the 2% women's version can decrease the risk of unwanted facial hair growth.    Return in about 4  weeks (around 05/20/2023) for warts, hair loss .  I, Fay Kirks, CMA, am acting as scribe for Rexene Rattler, MD .   Documentation: I have reviewed the above documentation for accuracy and completeness, and I agree with the above.  Rexene Rattler, MD

## 2023-04-22 NOTE — Assessment & Plan Note (Signed)
 Female pattern hair loss.  Advised over-the-counter Rogaine  for women, over-the-counter hair skin and nail supplement.  If no improvement, advised dermatology consult

## 2023-04-22 NOTE — Assessment & Plan Note (Signed)
 Start metformin  to aid in weight loss.  Counseled on metformin , side effects and titration.

## 2023-04-22 NOTE — Patient Instructions (Addendum)
 Restart topamax  25mg  at bedtime. Every week we can increase by 25mg  . Max dose is 200mg  per day in divided doses.   For hair loss , please use OTC "women's Rogaine"  (minoxidil ) 5% solution/foam as directed.   You may also start an over the counter hair skin and nails supplement.   Metformin  is used in prediabetes, diabetes, and also for weight loss by decreasing calorie consumption.   It works in a couple of ways by decreasing liver glucose production, decreases intestinal absorption of glucose and improves insulin  sensitivity (increases peripheral glucose uptake and utilization).     Please make sure that you titrate per below so not to cause any GI upset.    Start metformin  XR with one 500mg  tablet at night. After one week, you may increase to two tablets at night ( total of 1000mg ) . The third week, you may take take two tablets at night and one tablet in the morning.  The fourth week, you may take two tablets in the morning ( 1000mg  total) and two tablets at night (1000mg  total). This will bring you to a maximum daily dose of 2000mg /day which is maximum dose.  So you are aware,  you may take ALL 4 tablets of metformin  together at the same time if preferable and doesn't cause GI upset. You may take metformin  2000mg  ( four of the 500mg  tablets) together in the morning or at night if you prefer.   Along the way, if you want to increase more slowly, please do as this medication can cause GI discomfort and loose stools which usually get better with time , however some patients find that they can only tolerate a certain dose and cannot increase to maximum dose.

## 2023-04-22 NOTE — Assessment & Plan Note (Addendum)
 Chronic, similar to headaches in the past.  Suboptimal control.  Trial of Topamax  and discussed titration.

## 2023-04-23 LAB — INSULIN, RANDOM: Insulin: 9.2 u[IU]/mL

## 2023-04-27 ENCOUNTER — Encounter: Payer: Self-pay | Admitting: Family

## 2023-05-10 ENCOUNTER — Other Ambulatory Visit: Payer: Self-pay

## 2023-05-10 ENCOUNTER — Other Ambulatory Visit: Payer: Self-pay | Admitting: Family

## 2023-05-10 ENCOUNTER — Encounter: Payer: Self-pay | Admitting: Family

## 2023-05-10 DIAGNOSIS — R519 Headache, unspecified: Secondary | ICD-10-CM

## 2023-05-10 MED ORDER — TOPIRAMATE 25 MG PO TABS: 75.0000 mg | ORAL_TABLET | Freq: Every day | ORAL | 3 refills | Status: DC

## 2023-05-20 ENCOUNTER — Telehealth: Admitting: Physician Assistant

## 2023-05-20 DIAGNOSIS — R3989 Other symptoms and signs involving the genitourinary system: Secondary | ICD-10-CM

## 2023-05-21 MED ORDER — SULFAMETHOXAZOLE-TRIMETHOPRIM 800-160 MG PO TABS: 1.0000 | ORAL_TABLET | Freq: Two times a day (BID) | ORAL | 0 refills | Status: DC

## 2023-05-21 NOTE — Progress Notes (Signed)
 E-Visit for Urinary Problems  We are sorry that you are not feeling well.  Here is how we plan to help!  Based on what you shared with me it looks like you most likely have a simple urinary tract infection.  A UTI (Urinary Tract Infection) is a bacterial infection of the bladder.  Most cases of urinary tract infections are simple to treat but a key part of your care is to encourage you to drink plenty of fluids and watch your symptoms carefully.  I have prescribed Bactrim DS One tablet twice a day for 5 days.  Your symptoms should gradually improve. Call us if the burning in your urine worsens, you develop worsening fever, back pain or pelvic pain or if your symptoms do not resolve after completing the antibiotic.  Urinary tract infections can be prevented by drinking plenty of water to keep your body hydrated.  Also be sure when you wipe, wipe from front to back and don't hold it in!  If possible, empty your bladder every 4 hours.  HOME CARE Drink plenty of fluids Compete the full course of the antibiotics even if the symptoms resolve Remember, when you need to go.go. Holding in your urine can increase the likelihood of getting a UTI! GET HELP RIGHT AWAY IF: You cannot urinate You get a high fever Worsening back pain occurs You see blood in your urine You feel sick to your stomach or throw up You feel like you are going to pass out  MAKE SURE YOU  Understand these instructions. Will watch your condition. Will get help right away if you are not doing well or get worse.   Thank you for choosing an e-visit.  Your e-visit answers were reviewed by a board certified advanced clinical practitioner to complete your personal care plan. Depending upon the condition, your plan could have included both over the counter or prescription medications.  Please review your pharmacy choice. Make sure the pharmacy is open so you can pick up prescription now. If there is a problem, you may contact  your provider through [employer] and have the prescription routed to another pharmacy.  Your safety is important to [place]. If you have drug allergies check your prescription carefully.   For the next 24 hours you can use MyChart to ask questions about today's visit, request a non-urgent call back, or ask for a work or school excuse. You will get an email in the next two days asking about your experience. I hope that your e-visit has been valuable and will speed your recovery.    I have spent 5 minutes in review of e-visit questionnaire, review and updating patient chart, medical decision making and response to patient.   Margaretann Loveless, PA-C

## 2023-05-25 ENCOUNTER — Ambulatory Visit (INDEPENDENT_AMBULATORY_CARE_PROVIDER_SITE_OTHER): Payer: Managed Care, Other (non HMO) | Admitting: Dermatology

## 2023-05-25 ENCOUNTER — Encounter: Payer: Self-pay | Admitting: Dermatology

## 2023-05-25 DIAGNOSIS — B079 Viral wart, unspecified: Secondary | ICD-10-CM | POA: Diagnosis not present

## 2023-05-25 DIAGNOSIS — Z79899 Other long term (current) drug therapy: Secondary | ICD-10-CM | POA: Diagnosis not present

## 2023-05-25 DIAGNOSIS — L649 Androgenic alopecia, unspecified: Secondary | ICD-10-CM

## 2023-05-25 DIAGNOSIS — Z7189 Other specified counseling: Secondary | ICD-10-CM

## 2023-05-25 MED ORDER — MINOXIDIL 2.5 MG PO TABS
2.5000 mg | ORAL_TABLET | Freq: Every day | ORAL | 6 refills | Status: DC
Start: 2023-05-25 — End: 2023-09-09

## 2023-05-25 NOTE — Progress Notes (Signed)
 Follow-Up Visit   Subjective  EDAN SERRATORE is a 33 y.o. female who presents for the following: patient here for 4 week wart follow up at hands, treated with Ln2 at last visit. Patient would also like to discuss hair loss she has noticed mostly in front and sides of scalp.  The patient has spots, moles and lesions to be evaluated, some may be new or changing and the patient may have concern these could be cancer.  The following portions of the chart were reviewed this encounter and updated as appropriate: medications, allergies, medical history  Review of Systems:  No other skin or systemic complaints except as noted in HPI or Assessment and Plan.                Objective  Well appearing patient in no apparent distress; mood and affect are within normal limits.  A focused examination was performed of the following areas: Scalp, b/l hands  Relevant exam findings are noted in the Assessment and Plan.  right hand x 3, left hand x 3 (6) Verrucous papules -- Discussed viral etiology and contagion.   Left hand x 3  Right hand x 3  Assessment & Plan   ANDROGENETIC ALOPECIA (FEMALE PATTERN HAIR LOSS) Exam: Diffuse thinning of the crown and widening of the midline part with retention of the frontal hairline Chronic and persistent condition with duration or expected duration over one year. Condition is symptomatic/ bothersome to patient. Not currently at goal. Female Androgenic Alopecia is a chronic condition related to genetics and/or hormonal changes.  In women androgenetic alopecia is commonly associated with menopause but may occur any time after puberty.  It causes hair thinning primarily on the crown with widening of the part and temporal hairline recession.  Can use OTC Rogaine (minoxidil) 5% solution/foam as directed.  Oral treatments in female patients who have no contraindication may include : - Low dose oral minoxidil 1.25 - 5mg  daily - Spironolactone 50 - 100mg   bid - Finasteride 2.5 - 5 mg daily Adjunctive therapies include: - Low Level Laser Light Therapy (LLLT) - Platelet-rich plasma injections (PRP) - Hair Transplants or scalp reduction   Treatment Plan: Photos today Discussed otc rogaine topical  Discussed oral minoxidil   Oral Biotin 2.5 mg daily Start minoxidil 2.5 mg tablet - take 1/2 tab by mouth daily   BP 108/73 P 85 Wt 228.2 lbs   Patient denies heart issues, ankle swelling patient is not planning to become pregnant advised if that changes to discontinue   Recommend continue 6 months to see best results  Recommend 6 month follow up   Doses of oral minoxidil for hair loss are considered 'low dose'. This is because the doses used for hair loss are much lower than the doses which are used for conditions such as high blood pressure (hypertension). The doses used for hypertension are 10-40mg  per day.  Side effects are uncommon at the low doses (up to 2.5 mg/day) used to treat hair loss. Potential side effects, more commonly seen at higher doses, include: Increase in hair growth (hypertrichosis) elsewhere on face and body Temporary hair shedding upon starting medication which may last up to 4 weeks Ankle swelling, fluid retention, rapid weight gain more than 5 pounds Low blood pressure and feeling lightheaded or dizzy when standing up quickly Fast or irregular heartbeat Headaches  Long term medication management.  Patient is using long term (months to years) prescription medication  to control their dermatologic condition.  These medications require periodic monitoring to evaluate for efficacy and side effects and may require periodic laboratory monitoring.   ANDROGENETIC ALOPECIA   Related Medications minoxidil (LONITEN) 2.5 MG tablet Take 1 tablet (2.5 mg total) by mouth daily. VIRAL WARTS, UNSPECIFIED TYPE (6) right hand x 3, left hand x 3 (6) Viral Wart (HPV) Counseling  Discussed viral / HPV (Human Papilloma Virus)  etiology and risk of spread /infectivity to other areas of body as well as to other people.  Multiple treatments and methods may be required to clear warts and it is possible treatment may not be successful.  Treatment risks include discoloration; scarring and there is still potential for wart recurrence.  Recommend if not improving will consider Intralesional candida antigen injections and topical wart cream treatment  Destruction of lesion - right hand x 3, left hand x 3 (6) Complexity: simple   Destruction method: cryotherapy   Informed consent: discussed and consent obtained   Timeout:  patient name, date of birth, surgical site, and procedure verified Lesion destroyed using liquid nitrogen: Yes   Region frozen until ice ball extended beyond lesion: Yes   Outcome: patient tolerated procedure well with no complications   Post-procedure details: wound care instructions given    Return in about 6 months (around 11/25/2023) for alopecia follow up and wart follow up.  IAsher Muir, CMA, am acting as scribe for Armida Sans, MD.   Documentation: I have reviewed the above documentation for accuracy and completeness, and I agree with the above.  Armida Sans, MD

## 2023-05-25 NOTE — Patient Instructions (Addendum)
 If warts are not resolved in 6 weeks call back and make follow up appt    Start minoxidil 2.5 mg tab take 1/2 tab by mouth daily   Doses of oral minoxidil for hair loss are considered 'low dose'. This is because the doses used for hair loss are much lower than the doses which are used for conditions such as high blood pressure (hypertension). The doses used for hypertension are 10-40mg  per day.  Side effects are uncommon at the low doses (up to 2.5 mg/day) used to treat hair loss. Potential side effects, more commonly seen at higher doses, include: Increase in hair growth (hypertrichosis) elsewhere on face and body Temporary hair shedding upon starting medication which may last up to 4 weeks Ankle swelling, fluid retention, rapid weight gain more than 5 pounds Low blood pressure and feeling lightheaded or dizzy when standing up quickly Fast or irregular heartbeat Headaches   Female Androgenic Alopecia is a chronic condition related to genetics and/or hormonal changes.  In women androgenetic alopecia is commonly associated with menopause but may occur any time after puberty.  It causes hair thinning primarily on the crown with widening of the part and temporal hairline recession.  Can use OTC Rogaine (minoxidil) 5% solution/foam as directed.  Oral treatments in female patients who have no contraindication may include : - Low dose oral minoxidil 1.25 - 5mg  daily - Spironolactone 50 - 100mg  bid - Finasteride 2.5 - 5 mg daily Adjunctive therapies include: - Low Level Laser Light Therapy (LLLT) - Platelet-rich plasma injections (PRP) - Hair Transplants or scalp reduction      Recommend minoxidil 5% (Rogaine for men) solution or foam to be applied to the scalp and left in. This should ideally be used twice daily for best results but it helps with hair regrowth when used at least three times per week. Rogaine initially can cause increased hair shedding for the first few weeks but this will stop  with continued use. In studies, people who used minoxidil (Rogaine) for at least 6 months had thicker hair than people who did not. Minoxidil topical (Rogaine) only works as long as it continues to be used. If if it is no longer used then the hair it has been helping to regrow can fall out. Minoxidil topical (Rogaine) can cause increased facial hair growth which can usually be managed easily with a battery-operated hair trimmer. If facial hair growth is bothersome, switching to the 2% women's version can decrease the risk of unwanted facial hair growth.    Doses of oral minoxidil for hair loss are considered 'low dose'. This is because the doses used for hair loss are much lower than the doses which are used for conditions such as high blood pressure (hypertension). The doses used for hypertension are 10-40mg  per day.  Side effects are uncommon at the low doses (up to 2.5 mg/day) used to treat hair loss. Potential side effects, more commonly seen at higher doses, include: Increase in hair growth (hypertrichosis) elsewhere on face and body Temporary hair shedding upon starting medication which may last up to 4 weeks Ankle swelling, fluid retention, rapid weight gain more than 5 pounds Low blood pressure and feeling lightheaded or dizzy when standing up quickly Fast or irregular heartbeat Headaches    Cryotherapy Aftercare  Wash gently with soap and water everyday.   Apply Vaseline and Band-Aid daily until healed.     Due to recent changes in healthcare laws, you may see results of your pathology  and/or laboratory studies on MyChart before the doctors have had a chance to review them. We understand that in some cases there may be results that are confusing or concerning to you. Please understand that not all results are received at the same time and often the doctors may need to interpret multiple results in order to provide you with the best plan of care or course of treatment. Therefore, we ask  that you please give [place] 2 business days to thoroughly review all your results before contacting the office for clarification. Should we see a critical lab result, you will be contacted sooner.   If You Need Anything After Your Visit  If you have any questions or concerns for your doctor, please call our main line at [phone] and press option 4 to reach your doctor's medical assistant. If no one answers, please leave a voicemail as directed and we will return your call as soon as possible. Messages left after 4 pm will be answered the following business day.   You may also send [place] a message via MyChart. We typically respond to MyChart messages within 1-2 business days.  For prescription refills, please ask your pharmacy to contact our office. Our fax number is [phone].  If you have an urgent issue when the clinic is closed that cannot wait until the next business day, you can page your doctor at the number below.    Please note that while we do our best to be available for urgent issues outside of office hours, we are not available 24/7.   If you have an urgent issue and are unable to reach [place], you may choose to seek medical care at your doctor's office, retail clinic, urgent care center, or emergency room.  If you have a medical emergency, please immediately call 911 or go to the emergency department.  Pager Numbers  - Dr. Gwen Pounds: [phone]  - Dr. Roseanne Reno: [phone]  - Dr. Katrinka Blazing: [phone]   In the event of inclement weather, please call our main line at [phone] for an update on the status of any delays or closures.  Dermatology Medication Tips: Please keep the boxes that topical medications come in in order to help keep track of the instructions about where and how to use these. Pharmacies typically print the medication instructions only on the boxes and not directly on the medication tubes.   If your medication is too expensive, please contact our office at  [phone] option 4 or send [place] a message through MyChart.   We are unable to tell what your co-pay for medications will be in advance as this is different depending on your insurance coverage. However, we may be able to find a substitute medication at lower cost or fill out paperwork to get insurance to cover a needed medication.   If a prior authorization is required to get your medication covered by your insurance company, please allow [place] 1-2 business days to complete this process.  Drug prices often vary depending on where the prescription is filled and some pharmacies may offer cheaper prices.  The website www.goodrx.com contains coupons for medications through different pharmacies. The prices here do not account for what the cost may be with help from insurance (it may be cheaper with your insurance), but the website can give you the price if you did not use any insurance.  - You can print the associated coupon and take it with your prescription to the pharmacy.  - You may also stop by  our office during regular business hours and pick up a GoodRx coupon card.  - If you need your prescription sent electronically to a different pharmacy, notify our office through [facility] or by phone at [phone] option 4.     Si Usted Necesita Algo Despus de Su Visita  Tambin puede enviarnos un mensaje a travs de Clinical cytogeneticist. Por lo general respondemos a los mensajes de MyChart en el transcurso de 1 a 2 das hbiles.  Para renovar recetas, por favor pida a su farmacia que se ponga en contacto con nuestra oficina. Annie Sable de fax es [place] [phone].  Si tiene un asunto urgente cuando la clnica est cerrada y que no puede esperar hasta el siguiente da hbil, puede llamar/localizar a su doctor(a) al nmero que aparece a continuacin.   Por favor, tenga en cuenta que aunque hacemos todo lo posible para estar disponibles para asuntos urgentes fuera del horario de [place], no estamos  disponibles las 24 horas del da, los 7 [address]  [address] de la [place].   Si tiene un problema urgente y no puede comunicarse con nosotros, puede optar por buscar atencin mdica  en el consultorio de su doctor(a), en una clnica privada, en un centro de atencin urgente o en una sala de emergencias.  Si tiene Engineer, drilling, por favor llame inmediatamente al 911 o vaya a la sala de emergencias.  Nmeros de bper  - Dr. Gwen Pounds: [phone]  - Dra. Roseanne Reno: [phone]  - Dr. Katrinka Blazing: [phone]   En caso de inclemencias del tiempo, por favor llame a Lacy Duverney principal al [phone] para una actualizacin sobre el [place] de cualquier retraso o cierre.  Consejos para la medicacin en dermatologa: Por favor, guarde las cajas en las que vienen los medicamentos de uso tpico para ayudarle a seguir las instrucciones sobre dnde y cmo usarlos. Las farmacias generalmente imprimen las instrucciones del medicamento slo en las cajas y no directamente en los tubos del [place].   Si su medicamento es muy caro, por favor, pngase en contacto con Rolm Gala llamando al [phone] y presione la opcin 4 o envenos un mensaje a travs de Clinical cytogeneticist.   No podemos decirle cul ser su copago por los medicamentos por adelantado ya que esto es diferente dependiendo de la cobertura de su seguro. Sin embargo, es posible que podamos encontrar un medicamento sustituto a Audiological scientist un formulario para que el seguro cubra el medicamento que se considera necesario.   Si se requiere una autorizacin previa para que su compaa de seguros [place] su medicamento, por favor permtanos de 1 a 2 das hbiles para completar [address]  Los precios de los medicamentos varan con frecuencia dependiendo del Environmental consultant de dnde se surte la receta y alguna farmacias pueden ofrecer precios ms baratos.  El sitio web www.goodrx.com tiene cupones para medicamentos de Health and safety inspector. Los precios aqu no  tienen en cuenta lo que podra costar con la ayuda del seguro (puede ser ms barato con su seguro), pero el sitio web puede darle el precio si no utiliz Tourist information centre manager.  - Puede imprimir el cupn correspondiente y llevarlo con su receta a la farmacia.  - Tambin puede pasar por nuestra oficina durante el horario de atencin regular y Education officer, museum una tarjeta de cupones de GoodRx.  - Si necesita que su receta se enve electrnicamente a una farmacia diferente, informe a nuestra oficina a travs de MyChart de [place] o por telfono llamando al [phone] y presione la opcin 4.

## 2023-05-26 ENCOUNTER — Telehealth: Payer: Self-pay

## 2023-05-26 NOTE — Telephone Encounter (Signed)
 Tried calling patient to recommend biotin supplement. No answer. LM for patient to return call.

## 2023-05-30 ENCOUNTER — Telehealth: Admitting: Family Medicine

## 2023-05-30 DIAGNOSIS — B9689 Other specified bacterial agents as the cause of diseases classified elsewhere: Secondary | ICD-10-CM | POA: Diagnosis not present

## 2023-05-30 DIAGNOSIS — J019 Acute sinusitis, unspecified: Secondary | ICD-10-CM | POA: Diagnosis not present

## 2023-05-30 MED ORDER — DOXYCYCLINE HYCLATE 100 MG PO TABS: 100.0000 mg | ORAL_TABLET | Freq: Two times a day (BID) | ORAL | 0 refills | Status: AC

## 2023-05-30 NOTE — Progress Notes (Signed)

## 2023-06-17 ENCOUNTER — Encounter: Payer: Self-pay | Admitting: Family

## 2023-06-21 ENCOUNTER — Encounter: Payer: Self-pay | Admitting: Family

## 2023-06-21 ENCOUNTER — Other Ambulatory Visit: Payer: Self-pay | Admitting: Family

## 2023-06-21 ENCOUNTER — Ambulatory Visit (INDEPENDENT_AMBULATORY_CARE_PROVIDER_SITE_OTHER): Payer: Managed Care, Other (non HMO) | Admitting: Family

## 2023-06-21 VITALS — BP 130/76 | HR 76 | Temp 98.0°F | Ht 67.0 in | Wt 230.4 lb

## 2023-06-21 DIAGNOSIS — R519 Headache, unspecified: Secondary | ICD-10-CM

## 2023-06-21 DIAGNOSIS — E559 Vitamin D deficiency, unspecified: Secondary | ICD-10-CM | POA: Diagnosis not present

## 2023-06-21 DIAGNOSIS — Z8639 Personal history of other endocrine, nutritional and metabolic disease: Secondary | ICD-10-CM | POA: Diagnosis not present

## 2023-06-21 DIAGNOSIS — E663 Overweight: Secondary | ICD-10-CM | POA: Diagnosis not present

## 2023-06-21 LAB — VITAMIN D 25 HYDROXY (VIT D DEFICIENCY, FRACTURES): VITD: 22.82 ng/mL — ABNORMAL LOW (ref 30.00–100.00)

## 2023-06-21 MED ORDER — TOPIRAMATE 50 MG PO TABS: 50.0000 mg | ORAL_TABLET | Freq: Two times a day (BID) | ORAL | 3 refills | Status: DC

## 2023-06-21 MED ORDER — TOPIRAMATE 25 MG PO TABS: 25.0000 mg | ORAL_TABLET | Freq: Every day | ORAL | 3 refills | Status: DC

## 2023-06-21 NOTE — Progress Notes (Signed)
 Assessment & Plan:  Nonintractable headache, unspecified chronicity pattern, unspecified headache type Assessment & Plan: Chronic, suboptimal control however improved.  Increase topamax to 125 mg total daily.  She will split doses Topamax 50mg  qam and 75 mg qpm.  She will let me know how she is doing  Orders: -     Topiramate; Take 1 tablet (25 mg total) by mouth at bedtime.  Dispense: 90 tablet; Refill: 3  Vitamin D deficiency  Overweight (BMI 25.0-29.9) -     Cortisol-am, blood  History of vitamin D deficiency -     VITAMIN D 25 Hydroxy (Vit-D Deficiency, Fractures)  Other orders -     Topiramate; Take 1 tablet (50 mg total) by mouth 2 (two) times daily.  Dispense: 180 tablet; Refill: 3     Return precautions given.   Risks, benefits, and alternatives of the medications and treatment plan prescribed today were discussed, and patient expressed understanding.   Education regarding symptom management and diagnosis given to patient on AVS either electronically or printed.  Return in about 6 months (around 12/21/2023) for Complete Physical Exam.  Rennie Plowman, FNP  Subjective:    Patient ID: Melissa Walton, female    DOB: May 07, 1990, 33 y.o.   MRN: [id number]  CC: Melissa Walton is a 33 y.o. female who presents today for follow up.   HPI: Feels well today.  No new complaints.    HA frequency has decreased on topamax 100mg . She had been 2 weeks without a HA.  She had a headache over the past week but she suspects is related to seasonal allergies.  She is compliant with Zyrtec.  She will use 1 dose of Maxalt typically with complete relief.  Most often she uses Excedrin Migraine.  She avoid NSAIDs after history of GI bleed  HA are similar to prior HA.  Denies nausea, vision changes.  HA are worse during seasonal allergies.    Compliant with topamax 75mg    She would like cortisol level checked today  History obesity, female pattern hair loss.  H/o vitamin d  Deficiency    Allergies: Almond (diagnostic), Nsaids, Lactose intolerance (gi), Loracarbef, Other, and Penicillins Current Outpatient Medications on File Prior to Visit  Medication Sig Dispense Refill   cetirizine (ZYRTEC ALLERGY) 10 MG tablet Take 1 tablet (10 mg total) by mouth daily. 90 tablet 1   MAGNESIUM GLYCINATE PO Take 600 mg by mouth at bedtime.     minoxidil (LONITEN) 2.5 MG tablet Take 1 tablet (2.5 mg total) by mouth daily. 30 tablet 6   ondansetron (ZOFRAN-ODT) 4 MG disintegrating tablet Take 1 tablet (4 mg total) by mouth every 8 (eight) hours as needed for nausea or vomiting. 30 tablet 1   rizatriptan (MAXALT) 10 MG tablet Take 1 tablet (10 mg total) by mouth as needed for migraine (May repeat after 2 hours.  Maximum 2 tablets in 24 hours.). May repeat in 2 hours if needed 10 tablet 5   tiZANidine (ZANAFLEX) 2 MG tablet Take 1 tablet (2 mg total) by mouth at bedtime as needed for muscle spasms. TAKE 1 TABLET(2 MG) BY MOUTH AT BEDTIME AS NEEDED FOR MUSCLE SPASMS 30 tablet 1   Vitamin D, Ergocalciferol, (DRISDOL) 1.25 MG (50000 UNIT) CAPS capsule Take 50,000 Units by mouth every 7 (seven) days.     azelastine (ASTELIN) 0.1 % nasal spray Place 2 sprays into both nostrils 2 (two) times daily. Use in each nostril as directed (Patient not taking: Reported on  06/21/2023) 30 mL 0   [DISCONTINUED] omeprazole (PRILOSEC OTC) 20 MG tablet Take 1 tablet (20 mg total) by mouth daily. 30 tablet 6   Current Facility-Administered Medications on File Prior to Visit  Medication Dose Route Frequency Provider Last Rate Last Admin   0.9 %  sodium chloride infusion  500 mL Intravenous Once Iva Boop, MD        Review of Systems  Constitutional:  Negative for chills and fever.  Eyes:  Negative for visual disturbance.  Respiratory:  Negative for cough.   Cardiovascular:  Negative for chest pain and palpitations.  Gastrointestinal:  Negative for nausea and vomiting.  Neurological:  Positive for  headaches.      Objective:    BP 130/76   Pulse 76   Temp 98 F (36.7 C) (Oral)   Ht 5\' 7"  (1.702 m)   Wt 230 lb 6.4 oz (104.5 kg)   LMP  (LMP Unknown)   SpO2 97%   BMI 36.09 kg/m  BP Readings from Last 3 Encounters:  06/21/23 130/76  04/22/23 128/78  09/12/21 125/82   Wt Readings from Last 3 Encounters:  06/21/23 230 lb 6.4 oz (104.5 kg)  04/22/23 225 lb 12.8 oz (102.4 kg)  09/12/21 215 lb (97.5 kg)    Physical Exam Vitals reviewed.  Constitutional:      Appearance: She is well-developed.  Eyes:     Conjunctiva/sclera: Conjunctivae normal.  Cardiovascular:     Rate and Rhythm: Normal rate and regular rhythm.     Pulses: Normal pulses.     Heart sounds: Normal heart sounds.  Pulmonary:     Effort: Pulmonary effort is normal.     Breath sounds: Normal breath sounds. No wheezing, rhonchi or rales.  Skin:    General: Skin is warm and dry.  Neurological:     Mental Status: She is alert.  Psychiatric:        Speech: Speech normal.        Behavior: Behavior normal.        Thought Content: Thought content normal.

## 2023-06-21 NOTE — Assessment & Plan Note (Signed)
 Chronic, suboptimal control however improved.  Increase topamax to 125 mg total daily.  She will split doses Topamax 50mg  qam and 75 mg qpm.  She will let me know how she is doing

## 2023-06-21 NOTE — Patient Instructions (Signed)
 Increase topamax to 50mg  in the morning and 75mg  at bedtime.   Please let me know how you are doing

## 2023-06-22 LAB — CORTISOL-AM, BLOOD: Cortisol - AM: 19.7 ug/dL

## 2023-06-23 ENCOUNTER — Other Ambulatory Visit: Payer: Self-pay | Admitting: Family

## 2023-06-23 ENCOUNTER — Encounter: Payer: Self-pay | Admitting: Family

## 2023-06-23 DIAGNOSIS — E559 Vitamin D deficiency, unspecified: Secondary | ICD-10-CM

## 2023-06-23 MED ORDER — CHOLECALCIFEROL 1.25 MG (50000 UT) PO TABS
ORAL_TABLET | ORAL | 0 refills | Status: AC
Start: 2023-06-23 — End: ?

## 2023-06-27 ENCOUNTER — Telehealth: Admitting: Nurse Practitioner

## 2023-06-27 DIAGNOSIS — J302 Other seasonal allergic rhinitis: Secondary | ICD-10-CM | POA: Diagnosis not present

## 2023-06-27 MED ORDER — OLOPATADINE HCL 0.2 % OP SOLN: 1.0000 [drp] | Freq: Every day | OPHTHALMIC | 0 refills | Status: DC

## 2023-06-27 MED ORDER — LEVOCETIRIZINE DIHYDROCHLORIDE 5 MG PO TABS: 5.0000 mg | ORAL_TABLET | Freq: Every evening | ORAL | 0 refills | Status: DC

## 2023-06-27 MED ORDER — IPRATROPIUM BROMIDE 0.03 % NA SOLN: 2.0000 | Freq: Two times a day (BID) | NASAL | 0 refills | Status: DC

## 2023-06-27 NOTE — Progress Notes (Signed)
 I have spent 5 minutes in review of e-visit questionnaire, review and updating patient chart, medical decision making and response to patient.   Claiborne Rigg, NP

## 2023-06-27 NOTE — Progress Notes (Signed)
 E visit for Allergic Rhinitis We are sorry that you are not feeling well.  Here is how we plan to help!  Based on what you have shared with me it looks like you have Allergic Rhinitis.  Rhinitis is when a reaction occurs that causes nasal congestion, runny nose, sneezing, and itching.  Most types of rhinitis are caused by an inflammation and are associated with symptoms in the eyes ears or throat. There are several types of rhinitis.  The most common are acute rhinitis, which is usually caused by a viral illness, allergic or seasonal rhinitis, and nonallergic or year-round rhinitis.  Nasal allergies occur certain times of the year.  Allergic rhinitis is caused when allergens in the air trigger the release of histamine in the body.  Histamine causes itching, swelling, and fluid to build up in the fragile linings of the nasal passages, sinuses and eyelids.  An itchy nose and clear discharge are common.  I recommend the following over the counter treatments: Xyzal 5 mg take 1 tablet daily  I also would recommend a nasal spray: atrovent nasal spray which I have sent   You may also benefit from eye drops such as: Pataday eye drops  HOME CARE:  You can use an over-the-counter saline nasal spray as needed Avoid areas where there is heavy dust, mites, or molds Stay indoors on windy days during the pollen season Keep windows closed in home, at least in bedroom; use air conditioner. Use high-efficiency house air filter Keep windows closed in car, turn AC on re-circulate Avoid playing out with dog during pollen season  GET HELP RIGHT AWAY IF:  If your symptoms do not improve within 10 days You become short of breath You develop yellow or green discharge from your nose for over 3 days You have coughing fits  MAKE SURE YOU:  Understand these instructions Will watch your condition Will get help right away if you are not doing well or get worse  Thank you for choosing an e-visit. Your e-visit  answers were reviewed by a board certified advanced clinical practitioner to complete your personal care plan. Depending upon the condition, your plan could have included both over the counter or prescription medications. Please review your pharmacy choice. Be sure that the pharmacy you have chosen is open so that you can pick up your prescription now.  If there is a problem you may message your provider in MyChart to have the prescription routed to another pharmacy. Your safety is important to us . If you have drug allergies check your prescription carefully.  For the next 24 hours, you can use MyChart to ask questions about today's visit, request a non-urgent call back, or ask for a work or school excuse from your e-visit provider. You will get an email in the next two days asking about your experience. I hope that your e-visit has been valuable and will speed your recovery.

## 2023-07-02 ENCOUNTER — Telehealth: Admitting: Physician Assistant

## 2023-07-02 DIAGNOSIS — R3989 Other symptoms and signs involving the genitourinary system: Secondary | ICD-10-CM

## 2023-07-02 MED ORDER — NITROFURANTOIN MONOHYD MACRO 100 MG PO CAPS: 100.0000 mg | ORAL_CAPSULE | Freq: Two times a day (BID) | ORAL | 0 refills | Status: DC

## 2023-07-02 NOTE — Progress Notes (Signed)

## 2023-08-02 ENCOUNTER — Telehealth: Admitting: Physician Assistant

## 2023-08-02 DIAGNOSIS — J019 Acute sinusitis, unspecified: Secondary | ICD-10-CM

## 2023-08-02 DIAGNOSIS — B9689 Other specified bacterial agents as the cause of diseases classified elsewhere: Secondary | ICD-10-CM

## 2023-08-03 MED ORDER — DOXYCYCLINE HYCLATE 100 MG PO TABS: 100.0000 mg | ORAL_TABLET | Freq: Two times a day (BID) | ORAL | 0 refills | Status: DC

## 2023-08-03 NOTE — Progress Notes (Signed)
 I have spent 5 minutes in review of e-visit questionnaire, review and updating patient chart, medical decision making and response to patient.   Piedad Climes, PA-C

## 2023-08-03 NOTE — Progress Notes (Signed)

## 2023-08-15 ENCOUNTER — Encounter: Payer: Self-pay | Admitting: Dermatology

## 2023-08-22 ENCOUNTER — Encounter: Payer: Self-pay | Admitting: Family

## 2023-08-23 ENCOUNTER — Other Ambulatory Visit: Payer: Self-pay | Admitting: Family

## 2023-08-23 MED ORDER — TOPIRAMATE 100 MG PO TABS: 100.0000 mg | ORAL_TABLET | Freq: Every day | ORAL | 3 refills | Status: AC

## 2023-08-23 MED ORDER — TOPIRAMATE 25 MG PO TABS: 25.0000 mg | ORAL_TABLET | Freq: Every day | ORAL | 3 refills | Status: AC

## 2023-09-09 ENCOUNTER — Telehealth: Admitting: Physician Assistant

## 2023-09-09 DIAGNOSIS — R3989 Other symptoms and signs involving the genitourinary system: Secondary | ICD-10-CM | POA: Diagnosis not present

## 2023-09-09 MED ORDER — NITROFURANTOIN MONOHYD MACRO 100 MG PO CAPS: 100.0000 mg | ORAL_CAPSULE | Freq: Two times a day (BID) | ORAL | 0 refills | Status: DC

## 2023-09-09 NOTE — Progress Notes (Signed)
 I have spent 5 minutes in review of e-visit questionnaire, review and updating patient chart, medical decision making and response to patient.   Piedad Climes, PA-C

## 2023-09-09 NOTE — Progress Notes (Signed)

## 2023-10-06 ENCOUNTER — Telehealth: Admitting: Physician Assistant

## 2023-10-06 DIAGNOSIS — N390 Urinary tract infection, site not specified: Secondary | ICD-10-CM

## 2023-10-07 NOTE — Progress Notes (Signed)
  Because of recent treatment for UTI via e-visit in the past month, and need for urine culture because of risk of antibiotic resistant UTI, I feel your condition warrants further evaluation and I recommend that you be seen in a face-to-face visit.   NOTE: There will be NO CHARGE for this E-Visit   If you are having a true medical emergency, please call 911.     For an urgent face to face visit, [facility] has multiple urgent care centers for your convenience.  Click the link below for the full list of locations and hours, walk-in wait times, appointment scheduling options and driving directions:  Urgent Care - [place], [place], [place], [place], [place], [place]  [place]     Your MyChart E-visit questionnaire answers were reviewed by a board certified advanced clinical practitioner to complete your personal care plan based on your specific symptoms.    Thank you for using e-Visits.

## 2023-11-13 ENCOUNTER — Telehealth: Admitting: Physician Assistant

## 2023-11-13 DIAGNOSIS — B9689 Other specified bacterial agents as the cause of diseases classified elsewhere: Secondary | ICD-10-CM | POA: Diagnosis not present

## 2023-11-13 DIAGNOSIS — J019 Acute sinusitis, unspecified: Secondary | ICD-10-CM | POA: Diagnosis not present

## 2023-11-13 MED ORDER — LEVOFLOXACIN 500 MG PO TABS
500.0000 mg | ORAL_TABLET | Freq: Every day | ORAL | 0 refills | Status: AC
Start: 2023-11-13 — End: 2023-11-20

## 2023-11-13 NOTE — Progress Notes (Signed)
 I have spent 5 minutes in review of e-visit questionnaire, review and updating patient chart, medical decision making and response to patient.   Elsie Velma Lunger, PA-C

## 2023-11-13 NOTE — Progress Notes (Signed)
E-Visit for Sinus Problems  We are sorry that you are not feeling well.  Here is how we plan to help!  Based on what you have shared with me it looks like you have sinusitis.  Sinusitis is inflammation and infection in the sinus cavities of the head.  Based on your presentation I believe you most likely have Acute Bacterial Sinusitis.  This is an infection caused by bacteria and is treated with antibiotics. I have prescribed Levofloxicin 500mg by mouth once daily for 7 days. You may use an oral decongestant such as Mucinex D or if you have glaucoma or high blood pressure use plain Mucinex. Saline nasal spray help and can safely be used as often as needed for congestion.  If you develop worsening sinus pain, fever or notice severe headache and vision changes, or if symptoms are not better after completion of antibiotic, please schedule an appointment with a health care provider.    Sinus infections are not as easily transmitted as other respiratory infection, however we still recommend that you avoid close contact with loved ones, especially the very young and elderly.  Remember to wash your hands thoroughly throughout the day as this is the number one way to prevent the spread of infection!  Home Care: Only take medications as instructed by your medical team. Complete the entire course of an antibiotic. Do not take these medications with alcohol. A steam or ultrasonic humidifier can help congestion.  You can place a towel over your head and breathe in the steam from hot water coming from a faucet. Avoid close contacts especially the very young and the elderly. Cover your mouth when you cough or sneeze. Always remember to wash your hands.  Get Help Right Away If: You develop worsening fever or sinus pain. You develop a severe head ache or visual changes. Your symptoms persist after you have completed your treatment plan.  Make sure you Understand these instructions. Will watch your  condition. Will get help right away if you are not doing well or get worse.  Thank you for choosing an e-visit.  Your e-visit answers were reviewed by a board certified advanced clinical practitioner to complete your personal care plan. Depending upon the condition, your plan could have included both over the counter or prescription medications.  Please review your pharmacy choice. Make sure the pharmacy is open so you can pick up prescription now. If there is a problem, you may contact your provider through MyChart messaging and have the prescription routed to another pharmacy.  Your safety is important to us. If you have drug allergies check your prescription carefully.   For the next 24 hours you can use MyChart to ask questions about today's visit, request a non-urgent call back, or ask for a work or school excuse. You will get an email in the next two days asking about your experience. I hope that your e-visit has been valuable and will speed your recovery.  

## 2023-11-30 ENCOUNTER — Encounter: Payer: Self-pay | Admitting: Dermatology

## 2023-11-30 ENCOUNTER — Ambulatory Visit (INDEPENDENT_AMBULATORY_CARE_PROVIDER_SITE_OTHER): Admitting: Dermatology

## 2023-11-30 DIAGNOSIS — Z7189 Other specified counseling: Secondary | ICD-10-CM | POA: Diagnosis not present

## 2023-11-30 DIAGNOSIS — L649 Androgenic alopecia, unspecified: Secondary | ICD-10-CM | POA: Diagnosis not present

## 2023-11-30 DIAGNOSIS — Z79899 Other long term (current) drug therapy: Secondary | ICD-10-CM

## 2023-11-30 DIAGNOSIS — B078 Other viral warts: Secondary | ICD-10-CM

## 2023-11-30 DIAGNOSIS — Q386 Other congenital malformations of mouth: Secondary | ICD-10-CM

## 2023-11-30 MED ORDER — MINOXIDIL 2.5 MG PO TABS: 2.5000 mg | ORAL_TABLET | Freq: Every day | ORAL | 1 refills | Status: AC

## 2023-11-30 MED ORDER — FLUOROURACIL 5 % EX CREA: TOPICAL_CREAM | Freq: Every day | CUTANEOUS | 2 refills | Status: AC

## 2023-11-30 NOTE — Progress Notes (Signed)
 Follow-Up Visit   Subjective  Melissa Walton is a 33 y.o. female who presents for the following: Androgenetic alopecia scalp, 33m f/u, d/c Minoxidil  due to feet swelling, pt has red light cap and has been using for 2-21m, may be some improvement,  warts R and L hand 59m f/u did not clear with LN2, some new ones, check spot upper lip, ~64m, no changes  The following portions of the chart were reviewed this encounter and updated as appropriate: medications, allergies, medical history  Review of Systems:  No other skin or systemic complaints except as noted in HPI or Assessment and Plan.  Objective  Well appearing patient in no apparent distress; mood and affect are within normal limits.  A focused examination was performed of the following areas: Scalp, hands  Relevant exam findings are noted in the Assessment and Plan.  Scalp           L dorsum hand at base of thumb x 1, L dorsum hand x 1, R dorsum hand x 2, R wrist x 1, R 3rd finger periungual x 1 (6) Verrucous papules -- Discussed viral etiology and contagion.   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 Condition is improving with treatment but not currently at goal.  Female Androgenic Alopecia is a chronic condition related to genetics and/or hormonal changes.  In women androgenetic alopecia is commonly associated with menopause but may occur any time after puberty.  It causes hair thinning primarily on the crown with widening of the part and temporal hairline recession.  Can use OTC Rogaine  (minoxidil ) 5% solution/foam as directed.  Oral treatments in female patients who have no contraindication may include : - Low dose oral  minoxidil  1.25 - 5mg  daily - Spironolactone 50 - 100mg  bid - Finasteride 2.5 - 5 mg daily Adjunctive therapies include: - Low Level Laser Light Therapy (LLLT) - Platelet-rich plasma injections (PRP) - Hair Transplants or scalp reduction   Treatment Plan: Cont red light cap every other day Restart Minoxidil  2.5mg  1/4 po qd (0.625mg )   Doses of oral minoxidil  for hair loss are considered 'low dose'. This is because the doses used for hair loss are much lower than the doses which are used for conditions such as high blood pressure (hypertension). The doses used for hypertension are 10-40mg  per day.  Side effects are uncommon at the low doses (up to 2.5 mg/day) used to treat hair loss. Potential side effects, more commonly seen at higher doses, include: Increase in hair growth (hypertrichosis) elsewhere on face and body Temporary hair shedding upon starting medication which may last up to 4 weeks Ankle swelling, fluid retention, rapid weight gain more than 5 pounds Low blood pressure and feeling lightheaded or dizzy when standing up quickly Fast or irregular heartbeat Headaches   Long term medication management.  Patient is using long term (months to years) prescription medication  to control their dermatologic condition.  These medications require periodic monitoring to evaluate for efficacy and side effects and may require periodic laboratory monitoring.    FORDYCE spot  L upper lip Exam: yellowish pap Treatment Plan: Benign-appearing.  Observation.  Call clinic for new or changing moles.  Recommend daily use of broad spectrum spf 30+  sunscreen to sun-exposed areas.     OTHER VIRAL WARTS (6) L dorsum hand at base of thumb x 1, L dorsum hand x 1, R dorsum hand x 2, R wrist x 1, R 3rd finger periungual x 1 (6) Viral Wart (HPV) Counseling  Discussed viral / HPV (Human Papilloma Virus) etiology and risk of spread /infectivity to other areas of body as well as to other people.  Multiple  treatments and methods may be required to clear warts and it is possible treatment may not be successful.  Treatment risks include discoloration; scarring and there is still potential for wart recurrence.  Start SM Wart paste (Fluorouracil  5% / Salicylic Acid 70% Paste) qhs and cover with bandage  LN2 x 6 Squaric Acid 3% x 6 Cantharidin Plus x 6 Destruction of lesion - L dorsum hand at base of thumb x 1, L dorsum hand x 1, R dorsum hand x 2, R wrist x 1, R 3rd finger periungual x 1 (6) Complexity: simple   Destruction method: cryotherapy   Informed consent: discussed and consent obtained   Timeout:  patient name, date of birth, surgical site, and procedure verified Lesion destroyed using liquid nitrogen: Yes   Region frozen until ice ball extended beyond lesion: Yes   Outcome: patient tolerated procedure well with no complications   Post-procedure details: wound care instructions given    Destruction of lesion - L dorsum hand at base of thumb x 1, L dorsum hand x 1, R dorsum hand x 2, R wrist x 1, R 3rd finger periungual x 1 (6)  Destruction method: chemical removal   Informed consent: discussed and consent obtained   Timeout:  patient name, date of birth, surgical site, and procedure verified Chemical destruction method comment:  Squaric acid 3%, cantharidin plus Procedure instructions: patient instructed to wash and dry area   Outcome: patient tolerated procedure well with no complications   Post-procedure details: wound care instructions given   Additional details:  Patient advised to set alarm to remind them to wash off with soap and water at the directed time.   Return in about 6 months (around 05/29/2024) for Alopecia, wart f/u, recheck Fordyce bump upper lip.  I, Grayce Saunas, RMA, am acting as scribe for Alm Rhyme, MD .   Documentation: I have reviewed the above documentation for accuracy and completeness, and I agree with the above.  Alm Rhyme, MD

## 2023-11-30 NOTE — Patient Instructions (Addendum)
 Restart Minoxidil  2.5mg  decrease to 1/4 a pill a day  Instructions for After In-Office Application of Cantharidin  1. This is a strong medicine; please follow ALL instructions.  2. Gently wash off with soap and water in four hours or sooner s directed by your physician.  3. **WARNING** this medicine can cause severe blistering, blood blisters, infection, and/or scarring if it is not washed off as directed.  4. Your progress will be rechecked in 1-2 months; call sooner if there are any questions or problems.   Instructions for [facility] Medications  One or more of your medications was sent to the [facility] mail order compounding pharmacy. You will receive an email from them and can purchase the medicine through that link. It will then be mailed to your home at the address you confirmed. If for any reason you do not receive an email from them, please check your spam folder. If you still do not find the email, please let us  know. [facility] phone number is [phone].   Due to recent changes in healthcare laws, you may see results of your pathology and/or laboratory studies on MyChart before the doctors have had a chance to review them. We understand that in some cases there may be results that are confusing or concerning to you. Please understand that not all results are received at the same time and often the doctors may need to interpret multiple results in order to provide you with the best plan of care or course of treatment. Therefore, we ask that you please give us  2 business days to thoroughly review all your results before contacting the office for clarification. Should we see a critical lab result, you will be contacted sooner.   If You Need Anything After Your Visit  If you have any questions or concerns for your doctor, please call our main line at [phone] and press option 4 to reach your doctor's medical assistant. If no one answers, please leave a voicemail as  directed and we will return your call as soon as possible. Messages left after 4 pm will be answered the following business day.   You may also send us  a message via MyChart. We typically respond to MyChart messages within 1-2 business days.  For prescription refills, please ask your pharmacy to contact our office. Our fax number is [phone].  If you have an urgent issue when the clinic is closed that cannot wait until the next business day, you can page your doctor at the number below.    Please note that while we do our best to be available for urgent issues outside of office hours, we are not available 24/7.   If you have an urgent issue and are unable to reach us , you may choose to seek medical care at your doctor's office, retail clinic, urgent care center, or emergency room.  If you have a medical emergency, please immediately call 911 or go to the emergency department.  Pager Numbers  - Dr. Hester: [phone]  - Dr. Jackquline: [phone]  - Dr. Claudene: [phone]   - Dr. Raymund: [phone]  In the event of inclement weather, please call our main line at [phone] for an update on the status of any delays or closures.  Dermatology Medication Tips: Please keep the boxes that topical medications come in in order to help keep track of the instructions about where and how to use these. Pharmacies typically print the medication instructions only on the boxes and not directly  on the medication tubes.   If your medication is too expensive, please contact our office at [phone] option 4 or send us  a message through MyChart.   We are unable to tell what your co-pay for medications will be in advance as this is different depending on your insurance coverage. However, we may be able to find a substitute medication at lower cost or fill out paperwork to get insurance to cover a needed medication.   If a prior authorization is required to get your medication covered by your  insurance company, please allow us  1-2 business days to complete this process.  Drug prices often vary depending on where the prescription is filled and some pharmacies may offer cheaper prices.  The website www.goodrx.com contains coupons for medications through different pharmacies. The prices here do not account for what the cost may be with help from insurance (it may be cheaper with your insurance), but the website can give you the price if you did not use any insurance.  - You can print the associated coupon and take it with your prescription to the pharmacy.  - You may also stop by our office during regular business hours and pick up a GoodRx coupon card.  - If you need your prescription sent electronically to a different pharmacy, notify our office through [facility] or by phone at [phone] option 4.     Si Usted Necesita Algo Despus de Su Visita  Tambin puede enviarnos un mensaje a travs de Clinical cytogeneticist. Por lo general respondemos a los mensajes de MyChart en el transcurso de 1 a 2 das hbiles.  Para renovar recetas, por favor pida a su farmacia que se ponga en contacto con nuestra oficina. Randi lakes de fax es [place] [phone].  Si tiene un asunto urgente cuando la clnica est cerrada y que no puede esperar hasta el siguiente da hbil, puede llamar/localizar a su doctor(a) al nmero que aparece a continuacin.   Por favor, tenga en cuenta que aunque hacemos todo lo posible para estar disponibles para asuntos urgentes fuera del horario de [place], no estamos disponibles las 24 horas del da, los 7 [address]  [address] de la [place].   Si tiene un problema urgente y no puede comunicarse con nosotros, puede optar por buscar atencin mdica  en el consultorio de su doctor(a), en una clnica privada, en un centro de atencin urgente o en una sala de emergencias.  Si tiene Engineer, drilling, por favor llame inmediatamente al 911 o vaya a la sala de emergencias.  Nmeros de  bper  - Dr. Hester: [phone]  - Dra. Jackquline: [phone]  - Dr. Claudene: [phone]  - Dra. Kitts: [phone]  En caso de inclemencias del [place], por favor llame a nuestra lnea principal al [phone] para una actualizacin sobre el estado de cualquier retraso o cierre.  Consejos para la medicacin en dermatologa: Por favor, guarde las cajas en las que vienen los medicamentos de uso tpico para ayudarle a seguir las instrucciones sobre dnde y cmo usarlos. Las farmacias generalmente imprimen las instrucciones del medicamento slo en las cajas y no directamente en los tubos del [place].   Si su medicamento es muy caro, por favor, pngase en contacto con landry rieger llamando al [phone] y presione la opcin 4 o envenos un mensaje a travs de Clinical cytogeneticist.   No podemos decirle cul ser su copago por los medicamentos por adelantado ya que esto es diferente dependiendo de la cobertura de su seguro. Sin embargo, es posible que  podamos encontrar un medicamento sustituto a Audiological scientist un formulario para que el seguro cubra el medicamento que se considera necesario.   Si se requiere una autorizacin previa para que su compaa de seguros malta su medicamento, por favor permtanos de 1 a 2 das hbiles para completar este proceso.  Los precios de los medicamentos varan con frecuencia dependiendo del Environmental consultant de dnde se surte la receta y alguna farmacias pueden ofrecer precios ms baratos.  El sitio web www.goodrx.com tiene cupones para medicamentos de Health and safety inspector. Los precios aqu no tienen en cuenta lo que podra costar con la ayuda del seguro (puede ser ms barato con su seguro), pero el sitio web puede darle el precio si no utiliz Tourist information centre manager.  - Puede imprimir el cupn correspondiente y llevarlo con su receta a la farmacia.  - Tambin puede pasar por nuestra oficina durante el horario de atencin regular y Education officer, museum una tarjeta de cupones de GoodRx.  -  Si necesita que su receta se enve electrnicamente a una farmacia diferente, informe a nuestra oficina a travs de MyChart de [place] o por telfono llamando al [phone] y presione la opcin 4.

## 2023-12-19 ENCOUNTER — Encounter: Payer: Self-pay | Admitting: Dermatology

## 2023-12-23 ENCOUNTER — Ambulatory Visit: Admitting: Family

## 2023-12-23 ENCOUNTER — Encounter: Payer: Self-pay | Admitting: Family

## 2023-12-23 VITALS — BP 120/72 | HR 70 | Temp 98.1°F | Ht 67.0 in | Wt 227.8 lb

## 2023-12-23 DIAGNOSIS — H6690 Otitis media, unspecified, unspecified ear: Secondary | ICD-10-CM

## 2023-12-23 DIAGNOSIS — R519 Headache, unspecified: Secondary | ICD-10-CM

## 2023-12-23 DIAGNOSIS — R350 Frequency of micturition: Secondary | ICD-10-CM | POA: Diagnosis not present

## 2023-12-23 DIAGNOSIS — Z0000 Encounter for general adult medical examination without abnormal findings: Secondary | ICD-10-CM

## 2023-12-23 DIAGNOSIS — M542 Cervicalgia: Secondary | ICD-10-CM

## 2023-12-23 DIAGNOSIS — E663 Overweight: Secondary | ICD-10-CM

## 2023-12-23 DIAGNOSIS — Z0001 Encounter for general adult medical examination with abnormal findings: Secondary | ICD-10-CM | POA: Diagnosis not present

## 2023-12-23 DIAGNOSIS — Z124 Encounter for screening for malignant neoplasm of cervix: Secondary | ICD-10-CM

## 2023-12-23 DIAGNOSIS — Z8639 Personal history of other endocrine, nutritional and metabolic disease: Secondary | ICD-10-CM

## 2023-12-23 LAB — COMPREHENSIVE METABOLIC PANEL WITH GFR
ALT: 17 U/L (ref 0–35)
AST: 13 U/L (ref 0–37)
Albumin: 4.7 g/dL (ref 3.5–5.2)
Alkaline Phosphatase: 80 U/L (ref 39–117)
BUN: 14 mg/dL (ref 6–23)
CO2: 24 meq/L (ref 19–32)
Calcium: 9.2 mg/dL (ref 8.4–10.5)
Chloride: 107 meq/L (ref 96–112)
Creatinine, Ser: 0.79 mg/dL (ref 0.40–1.20)
GFR: 99.98 mL/min (ref >60.00)
Glucose, Bld: 79 mg/dL (ref 70–99)
Potassium: 4.3 meq/L (ref 3.5–5.1)
Sodium: 138 meq/L (ref 135–145)
Total Bilirubin: 0.4 mg/dL (ref 0.2–1.2)
Total Protein: 7.3 g/dL (ref 6.0–8.3)

## 2023-12-23 LAB — URINALYSIS, ROUTINE W REFLEX MICROSCOPIC
Bilirubin Urine: NEGATIVE
Ketones, ur: NEGATIVE
Leukocytes,Ua: NEGATIVE
Nitrite: NEGATIVE
Specific Gravity, Urine: 1.02 (ref 1.000–1.030)
Total Protein, Urine: NEGATIVE
Urine Glucose: NEGATIVE
Urobilinogen, UA: 0.2 (ref 0.0–1.0)
pH: 6 (ref 5.0–8.0)

## 2023-12-23 LAB — CBC WITH DIFFERENTIAL/PLATELET
Basophils Absolute: 0.1 K/uL (ref 0.0–0.1)
Basophils Relative: 0.9 % (ref 0.0–3.0)
Eosinophils Absolute: 0.1 K/uL (ref 0.0–0.7)
Eosinophils Relative: 1.8 % (ref 0.0–5.0)
HCT: 42.3 % (ref 36.0–46.0)
Hemoglobin: 13.8 g/dL (ref 12.0–15.0)
Lymphocytes Relative: 18.4 % (ref 12.0–46.0)
Lymphs Abs: 1.4 K/uL (ref 0.7–4.0)
MCHC: 32.7 g/dL (ref 30.0–36.0)
MCV: 95.8 fl (ref 78.0–100.0)
Monocytes Absolute: 0.7 K/uL (ref 0.1–1.0)
Monocytes Relative: 9 % (ref 3.0–12.0)
Neutro Abs: 5.3 K/uL (ref 1.4–7.7)
Neutrophils Relative %: 69.9 % (ref 43.0–77.0)
Platelets: 293 K/uL (ref 150.0–400.0)
RBC: 4.41 Mil/uL (ref 3.87–5.11)
RDW: 12.7 % (ref 11.5–15.5)
WBC: 7.6 K/uL (ref 4.0–10.5)

## 2023-12-23 LAB — VITAMIN D 25 HYDROXY (VIT D DEFICIENCY, FRACTURES): VITD: 35.75 ng/mL (ref 30.00–100.00)

## 2023-12-23 MED ORDER — AZELASTINE HCL 0.1 % NA SOLN: 1.0000 | Freq: Two times a day (BID) | NASAL | 4 refills | Status: AC

## 2023-12-23 NOTE — Progress Notes (Signed)
 Assessment & Plan:  Routine general medical examination at a health care facility Assessment & Plan: Deferred pap smear as she is on her menses today. CBE performed. Encouraged exercise.   Orders: -     Urine Culture -     Urinalysis, Routine w reflex microscopic -     Azelastine  HCl; Place 1 spray into both nostrils 2 (two) times daily. Use in each nostril as directed  Dispense: 30 mL; Refill: 4 -     CBC with Differential/Platelet -     Comprehensive metabolic panel with GFR -     VITAMIN D  25 Hydroxy (Vit-D Deficiency, Fractures) -     Iron , TIBC and Ferritin Panel  History of vitamin D  deficiency  Overweight (BMI 25.0-29.9)  Cervicalgia  Cervical cancer screening  Nonintractable headache, unspecified chronicity pattern, unspecified headache type Assessment & Plan: Chronic, stable. Continue Topamax  50mg  qam and 75 mg qpm.     Urinary frequency Assessment & Plan: No systemic features.She declines starting abx ahead of urine culture unless symptoms were to worsen; she will let me know.    Acute otitis media, unspecified otitis media type Assessment & Plan: Reassuring HEENT exam. Allergic v viral etiology. Increase Zyrtec  to 10 mg twice daily and start azelastine  nasal spray. Consider allergy consult if symptoms persist.      Return precautions given.   Risks, benefits, and alternatives of the medications and treatment plan prescribed today were discussed, and patient expressed understanding.   Education regarding symptom management and diagnosis given to patient on AVS either electronically or printed.  No follow-ups on file.  [name], FNP  Subjective:    Patient ID: Melissa Walton, female    DOB: 05/30/1990, 33 y.o.   MRN: [id number]  CC: Melissa Walton is a 33 y.o. female who presents today for physical exam.    HPI: HPI  Discussed the use of AI scribe software for clinical note transcription with the patient, who gave verbal consent to  proceed.  History of Present Illness   Melissa Walton is a 33 year old female who presents with left ear pain and urinary symptoms.  She has been experiencing left ear pain for the past four days, described as an ache with a sensation of pressure at the outer part of the ear. There is no discharge, and the pain has not worsened. No fever, chills, or cough. She takes an off-brand Zyrtec  daily for allergies but has not used any nasal sprays or other medications specifically for the ear pain. She reports postnasal drip and congestion.  She reports urinary symptoms, including increased frequency and a change in urine odor, particularly in the morning, described as a 'weird smell'. She experiences mild abdominal discomfort when needing to urinate, though there is no burning sensation.  No current vaginal discharge or burning with urination.   Headaches are overall stable.  She is currently on Topamax  125 mg daily, which she feels has been the most effective treatment so far.   In terms of physical activity, her lifestyle is variable, with some days being more active than others, depending on her work schedule.         She is on menses today.   Follows with dermatology NO FDR with colon or breast cancer.   Cervical Cancer Screening: due; no record of ; she reports done with GYN.          Tetanus - UTD     Exercise: No formal exercise.  Alcohol use:  none Smoking/tobacco use: Nonsmoker.    Health Maintenance  Topic Date Due   HPV Vaccine (1 - 3-dose SCDM series) Never done   Pap with HPV screening  Never done   Flu Shot  06/13/2024*   DTaP/Tdap/Td vaccine (8 - Td or Tdap) 01/05/2029   Hepatitis C Screening  Completed   HIV Screening  Completed   Pneumococcal Vaccine  Aged Out   Meningitis B Vaccine  Aged Out   COVID-19 Vaccine  Discontinued  *Topic was postponed. The date shown is not the original due date.    ALLERGIES: Almond (diagnostic), Nsaids, Lactose intolerance  (gi), Loracarbef, Other, and Penicillins  Current Outpatient Medications on File Prior to Visit  Medication Sig Dispense Refill   cetirizine  (ZYRTEC ) 10 MG chewable tablet Chew 10 mg by mouth daily.     MAGNESIUM  GLYCINATE PO Take 600 mg by mouth at bedtime.     minoxidil  (LONITEN ) 2.5 MG tablet Take 1 tablet (2.5 mg total) by mouth daily. 30 tablet 1   ondansetron  (ZOFRAN -ODT) 4 MG disintegrating tablet Take 1 tablet (4 mg total) by mouth every 8 (eight) hours as needed for nausea or vomiting. 30 tablet 1   rizatriptan  (MAXALT ) 10 MG tablet Take 1 tablet (10 mg total) by mouth as needed for migraine (May repeat after 2 hours.  Maximum 2 tablets in 24 hours.). May repeat in 2 hours if needed 10 tablet 5   tiZANidine  (ZANAFLEX ) 2 MG tablet Take 1 tablet (2 mg total) by mouth at bedtime as needed for muscle spasms. TAKE 1 TABLET(2 MG) BY MOUTH AT BEDTIME AS NEEDED FOR MUSCLE SPASMS 30 tablet 1   topiramate  (TOPAMAX ) 100 MG tablet Take 1 tablet (100 mg total) by mouth daily. 90 tablet 3   topiramate  (TOPAMAX ) 25 MG tablet Take 1 tablet (25 mg total) by mouth at bedtime. 90 tablet 3   Cholecalciferol  1.25 MG (50000 UT) TABS 50,000 units PO qwk for 8 weeks. (Patient not taking: Reported on 12/23/2023) 8 tablet 0   fluorouracil  (EFUDEX ) 5 % cream Apply topically at bedtime. qhs to warts on hands and cover with bandage (Patient not taking: Reported on 12/23/2023) 15 g 2   [DISCONTINUED] omeprazole  (PRILOSEC  OTC) 20 MG tablet Take 1 tablet (20 mg total) by mouth daily. 30 tablet 6   Current Facility-Administered Medications on File Prior to Visit  Medication Dose Route Frequency Provider Last Rate Last Admin   0.9 %  sodium chloride  infusion  500 mL Intravenous Once Avram Lupita BRAVO, MD        Review of Systems  Constitutional:  Negative for chills, fever and unexpected weight change.  HENT:  Positive for ear pain and postnasal drip. Negative for congestion, ear discharge and sinus pain.   Respiratory:   Negative for cough, shortness of breath and wheezing.   Cardiovascular:  Negative for chest pain, palpitations and leg swelling.  Gastrointestinal:  Negative for nausea and vomiting.  Musculoskeletal:  Negative for arthralgias and myalgias.  Skin:  Negative for rash.  Neurological:  Negative for headaches.  Hematological:  Negative for adenopathy.  Psychiatric/Behavioral:  Negative for confusion.       Objective:    BP 120/72   Pulse 70   Temp 98.1 F (36.7 C) (Oral)   Ht 5' 7 (1.702 m)   Wt 227 lb 12.8 oz (103.3 kg)   LMP 12/22/2023 (Exact Date)   SpO2 98%   BMI 35.68 kg/m   BP Readings from Last  3 Encounters:  12/23/23 120/72  06/21/23 130/76  04/22/23 128/78   Wt Readings from Last 3 Encounters:  12/23/23 227 lb 12.8 oz (103.3 kg)  06/21/23 230 lb 6.4 oz (104.5 kg)  04/22/23 225 lb 12.8 oz (102.4 kg)    Physical Exam Vitals reviewed.  Constitutional:      Appearance: She is well-developed.  HENT:     Head: Normocephalic and atraumatic.     Right Ear: Hearing, tympanic membrane, ear canal and external ear normal. No decreased hearing noted. No drainage, swelling or tenderness. No middle ear effusion. No foreign body. Tympanic membrane is not erythematous or bulging.     Left Ear: Hearing, tympanic membrane, ear canal and external ear normal. No decreased hearing noted. No drainage, swelling or tenderness.  No middle ear effusion. No foreign body. Tympanic membrane is not erythematous or bulging.     Nose: Nose normal. No rhinorrhea.     Right Sinus: No maxillary sinus tenderness or frontal sinus tenderness.     Left Sinus: No maxillary sinus tenderness or frontal sinus tenderness.     Mouth/Throat:     Pharynx: Uvula midline. Posterior oropharyngeal erythema present. No oropharyngeal exudate.     Tonsils: No tonsillar abscesses.  Eyes:     Conjunctiva/sclera: Conjunctivae normal.  Neck:     Thyroid : No thyroid  mass or thyromegaly.  Cardiovascular:     Rate and  Rhythm: Normal rate and regular rhythm.     Pulses: Normal pulses.     Heart sounds: Normal heart sounds.  Pulmonary:     Effort: Pulmonary effort is normal.     Breath sounds: Normal breath sounds. No wheezing, rhonchi or rales.  Chest:  Breasts:    Breasts are symmetrical.     Right: No inverted nipple, mass, nipple discharge, skin change or tenderness.     Left: No inverted nipple, mass, nipple discharge, skin change or tenderness.  Lymphadenopathy:     Head:     Right side of head: No submental, submandibular, tonsillar, preauricular, posterior auricular or occipital adenopathy.     Left side of head: No submental, submandibular, tonsillar, preauricular, posterior auricular or occipital adenopathy.     Cervical: No cervical adenopathy.     Right cervical: No superficial, deep or posterior cervical adenopathy.    Left cervical: No superficial, deep or posterior cervical adenopathy.  Skin:    General: Skin is warm and dry.  Neurological:     Mental Status: She is alert.  Psychiatric:        Speech: Speech normal.        Behavior: Behavior normal.        Thought Content: Thought content normal.

## 2023-12-23 NOTE — Assessment & Plan Note (Signed)
 Reassuring HEENT exam. Allergic v viral etiology. Increase Zyrtec  to 10 mg twice daily and start azelastine  nasal spray. Consider allergy consult if symptoms persist.

## 2023-12-23 NOTE — Assessment & Plan Note (Signed)
 Chronic, stable. Continue Topamax  50mg  qam and 75 mg qpm.

## 2023-12-23 NOTE — Patient Instructions (Signed)
 Start azelastine  Increase zyrtec  10mg  to twice daily temporarily Health Maintenance, Female Adopting a healthy lifestyle and getting preventive care are important in promoting health and wellness. Ask your health care provider about: The right schedule for you to have regular tests and exams. Things you can do on your own to prevent diseases and keep yourself healthy. What should I know about diet, weight, and exercise? Eat a healthy diet  Eat a diet that includes plenty of vegetables, fruits, low-fat dairy products, and lean protein. Do not eat a lot of foods that are high in solid fats, added sugars, or sodium. Maintain a healthy weight Body mass index (BMI) is used to identify weight problems. It estimates body fat based on height and weight. Your health care provider can help determine your BMI and help you achieve or maintain a healthy weight. Get regular exercise Get regular exercise. This is one of the most important things you can do for your health. Most adults should: Exercise for at least 150 minutes each week. The exercise should increase your heart rate and make you sweat (moderate-intensity exercise). Do strengthening exercises at least twice a week. This is in addition to the moderate-intensity exercise. Spend less time sitting. Even light physical activity can be beneficial. Watch cholesterol and blood lipids Have your blood tested for lipids and cholesterol at 31 years of age, then have this test every 5 years. Have your cholesterol levels checked more often if: Your lipid or cholesterol levels are high. You are older than 31 years of age. You are at high risk for heart disease. What should I know about cancer screening? Depending on your health history and family history, you may need to have cancer screening at various ages. This may include screening for: Breast cancer. Cervical cancer. Colorectal cancer. Skin cancer. Lung cancer. What should I know about heart  disease, diabetes, and high blood pressure? Blood pressure and heart disease High blood pressure causes heart disease and increases the risk of stroke. This is more likely to develop in people who have high blood pressure readings or are overweight. Have your blood pressure checked: Every 3-5 years if you are 55-29 years of age. Every year if you are 66 years old or older. Diabetes Have regular diabetes screenings. This checks your fasting blood sugar level. Have the screening done: Once every three years after age 80 if you are at a normal weight and have a low risk for diabetes. More often and at a younger age if you are overweight or have a high risk for diabetes. What should I know about preventing infection? Hepatitis B If you have a higher risk for hepatitis B, you should be screened for this virus. Talk with your health care provider to find out if you are at risk for hepatitis B infection. Hepatitis C Testing is recommended for: Everyone born from [birth year] through 1963. Anyone with known risk factors for hepatitis C. Sexually transmitted infections (STIs) Get screened for STIs, including gonorrhea and chlamydia, if: You are sexually active and are younger than 31 years of age. You are older than 31 years of age and your health care provider tells you that you are at risk for this type of infection. Your sexual activity has changed since you were last screened, and you are at increased risk for chlamydia or gonorrhea. Ask your health care provider if you are at risk. Ask your health care provider about whether you are at high risk for HIV. Your health care provider  may recommend a prescription medicine to help prevent HIV infection. If you choose to take medicine to prevent HIV, you should first get tested for HIV. You should then be tested every 3 months for as long as you are taking the medicine. Pregnancy If you are about to stop having your period (premenopausal) and you may become  pregnant, seek counseling before you get pregnant. Take 400 to 800 micrograms (mcg) of folic acid  every day if you become pregnant. Ask for birth control (contraception) if you want to prevent pregnancy. Osteoporosis and menopause Osteoporosis is a disease in which the bones lose minerals and strength with aging. This can result in bone fractures. If you are 102 years old or older, or if you are at risk for osteoporosis and fractures, ask your health care provider if you should: Be screened for bone loss. Take a calcium or vitamin D  supplement to lower your risk of fractures. Be given hormone replacement therapy (HRT) to treat symptoms of menopause. Follow these instructions at home: Alcohol use Do not drink alcohol if: Your health care provider tells you not to drink. You are pregnant, may be pregnant, or are planning to become pregnant. If you drink alcohol: Limit how much you have to: 0-1 drink a day. Know how much alcohol is in your drink. In the [place] one drink equals one 12 oz bottle of beer (355 mL), one 5 oz glass of wine (148 mL), or one 1 oz glass of hard liquor (44 mL). Lifestyle Do not use any products that contain nicotine or tobacco. These products include cigarettes, chewing tobacco, and vaping devices, such as e-cigarettes. If you need help quitting, ask your health care provider. Do not use street drugs. Do not share needles. Ask your health care provider for help if you need support or information about quitting drugs. General instructions Schedule regular health, dental, and eye exams. Stay current with your vaccines. Tell your health care provider if: You often feel depressed. You have ever been abused or do not feel safe at home. Summary Adopting a healthy lifestyle and getting preventive care are important in promoting health and wellness. Follow your health care provider's instructions about healthy diet, exercising, and getting tested or screened for  diseases. Follow your health care provider's instructions on monitoring your cholesterol and blood pressure. This information is not intended to replace advice given to you by your health care provider. Make sure you discuss any questions you have with your health care provider. Document Revised: 07/22/2020 Document Reviewed: 07/22/2020 Elsevier Patient Education  2024 [employer].

## 2023-12-23 NOTE — Assessment & Plan Note (Signed)
 No systemic features.She declines starting abx ahead of urine culture unless symptoms were to worsen; she will let me know.

## 2023-12-23 NOTE — Assessment & Plan Note (Signed)
 Deferred pap smear as she is on her menses today. CBE performed. Encouraged exercise.

## 2023-12-24 LAB — URINE CULTURE
MICRO NUMBER:: 17079145
Result:: NO GROWTH
SPECIMEN QUALITY:: ADEQUATE

## 2023-12-24 LAB — IRON,TIBC AND FERRITIN PANEL
%SAT: 31 % (ref 16–45)
Ferritin: 34 ng/mL (ref 16–154)
Iron: 111 ug/dL (ref 40–190)
TIBC: 359 ug/dL (ref 250–450)

## 2024-01-06 ENCOUNTER — Ambulatory Visit: Payer: Self-pay | Admitting: Family

## 2024-01-07 ENCOUNTER — Telehealth: Admitting: Emergency Medicine

## 2024-01-07 DIAGNOSIS — N898 Other specified noninflammatory disorders of vagina: Secondary | ICD-10-CM

## 2024-01-07 NOTE — Progress Notes (Signed)
  Because you have symptoms that are more complicated (abdominal pain and red/brown discharge), I am not able to help you by Evisit and I feel your condition warrants further evaluation and I recommend that you be seen in a face-to-face visit.   NOTE: There will be NO CHARGE for this E-Visit   If you are having a true medical emergency, please call 911.     For an urgent face to face visit, [facility] has multiple urgent care centers for your convenience.  Click the link below for the full list of locations and hours, walk-in wait times, appointment scheduling options and driving directions:  Urgent Care - [place], [place], [place], [place], [place], [place]  [place]     Your MyChart E-visit questionnaire answers were reviewed by a board certified advanced clinical practitioner to complete your personal care plan based on your specific symptoms.    Thank you for using e-Visits.

## 2024-01-23 ENCOUNTER — Other Ambulatory Visit: Payer: Self-pay | Admitting: Dermatology

## 2024-01-24 NOTE — Telephone Encounter (Signed)
 Tried to call patient regarding dosage of minixodil 2.5 to verify how she is currently taking. Refill request came through requesting refill. Patient just received 2 refills of medication in September.  Chart states she takes 1/4 tab of minixodil 2.5 mg tab by mouth daily. Left message for patient to return call.

## 2024-01-27 NOTE — Telephone Encounter (Signed)
 Patient states that she didn't request refill that the pharmacy did. Advised pt to contact us  when she does need a refill.

## 2024-02-14 ENCOUNTER — Telehealth: Admitting: Family Medicine

## 2024-02-14 DIAGNOSIS — J019 Acute sinusitis, unspecified: Secondary | ICD-10-CM

## 2024-02-14 DIAGNOSIS — B9689 Other specified bacterial agents as the cause of diseases classified elsewhere: Secondary | ICD-10-CM | POA: Diagnosis not present

## 2024-02-14 MED ORDER — IPRATROPIUM BROMIDE 0.03 % NA SOLN: 2.0000 | Freq: Two times a day (BID) | NASAL | 0 refills | Status: AC

## 2024-02-14 MED ORDER — DOXYCYCLINE HYCLATE 100 MG PO TABS: 100.0000 mg | ORAL_TABLET | Freq: Two times a day (BID) | ORAL | 0 refills | Status: AC

## 2024-02-14 NOTE — Progress Notes (Signed)
 E-Visit for Sinus Problems  We are sorry that you are not feeling well.  Here is how we plan to help!  Based on what you have shared with me it looks like you have sinusitis.  Sinusitis is inflammation and infection in the sinus cavities of the head.  Based on your presentation I believe you most likely have Acute Bacterial Sinusitis.  This is an infection caused by bacteria and is treated with antibiotics. I have prescribed Because of certain medication allergies/intolerances, I have prescribed Doxycycline  100mg  Take 1 tablet by mouth twice daily for 7 days and I have also prescribed Ipratropium Bromide  Nasal Spray Use 1 spray in each nostril twice daily as needed for drainage; discontinue if too drying You may use an oral decongestant such as Mucinex  D or if you have glaucoma or high blood pressure use plain Mucinex . Saline nasal spray help and can safely be used as often as needed for congestion.  If you develop worsening sinus pain, fever or notice severe headache and vision changes, or if symptoms are not better after completion of antibiotic, please schedule an appointment with a health care provider.    Sinus infections are not as easily transmitted as other respiratory infection, however we still recommend that you avoid close contact with loved ones, especially the very young and elderly.  Remember to wash your hands thoroughly throughout the day as this is the number one way to prevent the spread of infection!  Home Care: Only take medications as instructed by your medical team. Complete the entire course of an antibiotic. Do not take these medications with alcohol. A steam or ultrasonic humidifier can help congestion.  You can place a towel over your head and breathe in the steam from hot water coming from a faucet. Avoid close contacts especially the very young and the elderly. Cover your mouth when you cough or sneeze. Always remember to wash your hands.  Get Help Right Away  If: You develop worsening fever or sinus pain. You develop a severe head ache or visual changes. Your symptoms persist after you have completed your treatment plan.  Make sure you Understand these instructions. Will watch your condition. Will get help right away if you are not doing well or get worse.  Your e-visit answers were reviewed by a board certified advanced clinical practitioner to complete your personal care plan.  Depending on the condition, your plan could have included both over the counter or prescription medications.  If there is a problem please reply  once you have received a response from your provider.  Your safety is important to us .  If you have drug allergies check your prescription carefully.    You can use MyChart to ask questions about today's visit, request a non-urgent call back, or ask for a work or school excuse for 24 hours related to this e-Visit. If it has been greater than 24 hours you will need to follow up with your provider, or enter a new e-Visit to address those concerns.  You will get an e-mail in the next two days asking about your experience.  I hope that your e-visit has been valuable and will speed your recovery. Thank you for using e-visits.  I have spent 5 minutes in review of e-visit questionnaire, review and updating patient chart, medical decision making and response to patient.   Chiquita CHRISTELLA Barefoot, NP

## 2024-03-07 ENCOUNTER — Telehealth: Admitting: Nurse Practitioner

## 2024-03-07 DIAGNOSIS — J329 Chronic sinusitis, unspecified: Secondary | ICD-10-CM

## 2024-03-07 DIAGNOSIS — B9789 Other viral agents as the cause of diseases classified elsewhere: Secondary | ICD-10-CM | POA: Diagnosis not present

## 2024-03-07 MED ORDER — IPRATROPIUM BROMIDE 0.03 % NA SOLN: 2.0000 | Freq: Two times a day (BID) | NASAL | 12 refills | Status: AC

## 2024-03-07 NOTE — Progress Notes (Signed)
 We are sorry that you are not feeling well.  Here is how we plan to help!  Based on what you have shared with me it looks like you have sinusitis.  Sinusitis is inflammation and infection in the sinus cavities of the head.  Based on your presentation I believe you most likely have Acute Viral Sinusitis.This is an infection most likely caused by a virus. There is not specific treatment for viral sinusitis other than to help you with the symptoms until the infection runs its course.  You may use an oral decongestant such as Mucinex  D or if you have glaucoma or high blood pressure use plain Mucinex . Saline nasal spray help and can safely be used as often as needed for congestion, I have prescribed: Ipratropium Bromide  nasal spray 0.03% 2 sprays in eah nostril 2-3 times a day  Some authorities believe that zinc sprays or the use of Echinacea may shorten the course of your symptoms.  Sinus infections are not as easily transmitted as other respiratory infection, however we still recommend that you avoid close contact with loved ones, especially the very young and elderly.  Remember to wash your hands thoroughly throughout the day as this is the number one way to prevent the spread of infection!  Home Care: Only take medications as instructed by your medical team. Do not take these medications with alcohol. A steam or ultrasonic humidifier can help congestion.  You can place a towel over your head and breathe in the steam from hot water coming from a faucet. Avoid close contacts especially the very young and the elderly. Cover your mouth when you cough or sneeze. Always remember to wash your hands.  Get Help Right Away If: You develop worsening fever or sinus pain. You develop a severe head ache or visual changes. Your symptoms persist after you have completed your treatment plan.  Make sure you Understand these instructions. Will watch your condition. Will get help right away if you are not doing  well or get worse.  Your e-visit answers were reviewed by a board certified advanced clinical practitioner to complete your personal care plan.  Depending on the condition, your plan could have included both over the counter or prescription medications.  If there is a problem please reply  once you have received a response from your provider.  Your safety is important to us .  If you have drug allergies check your prescription carefully.    You can use MyChart to ask questions about todays visit, request a non-urgent call back, or ask for a work or school excuse for 24 hours related to this e-Visit. If it has been greater than 24 hours you will need to follow up with your provider, or enter a new e-Visit to address those concerns.  You will get an e-mail in the next two days asking about your experience.  I hope that your e-visit has been valuable and will speed your recovery. Thank you for using e-visits.  I have spent 5 minutes in review of e-visit questionnaire, review and updating patient chart, medical decision making and response to patient.   Lauraine Kitty, FNP

## 2024-03-24 ENCOUNTER — Telehealth: Admitting: Physician Assistant

## 2024-03-24 DIAGNOSIS — R3989 Other symptoms and signs involving the genitourinary system: Secondary | ICD-10-CM

## 2024-03-24 MED ORDER — NITROFURANTOIN MONOHYD MACRO 100 MG PO CAPS: 100.0000 mg | ORAL_CAPSULE | Freq: Two times a day (BID) | ORAL | 0 refills | Status: AC

## 2024-03-24 NOTE — Progress Notes (Signed)

## 2024-04-21 ENCOUNTER — Ambulatory Visit: Admitting: Family

## 2024-06-07 ENCOUNTER — Ambulatory Visit: Admitting: Dermatology
